# Patient Record
Sex: Female | Born: 1935 | Race: White | Hispanic: No | Marital: Married | Smoking: Never smoker
Health system: Southern US, Community
[De-identification: ages and names within clinical notes are randomized; demographics above are authoritative.]

## PROBLEM LIST (undated history)

## (undated) DIAGNOSIS — Z87440 Personal history of urinary (tract) infections: Secondary | ICD-10-CM

## (undated) DIAGNOSIS — E785 Hyperlipidemia, unspecified: Secondary | ICD-10-CM

## (undated) DIAGNOSIS — C4490 Unspecified malignant neoplasm of skin, unspecified: Secondary | ICD-10-CM

## (undated) DIAGNOSIS — H9190 Unspecified hearing loss, unspecified ear: Secondary | ICD-10-CM

## (undated) DIAGNOSIS — G43909 Migraine, unspecified, not intractable, without status migrainosus: Secondary | ICD-10-CM

## (undated) DIAGNOSIS — G5603 Carpal tunnel syndrome, bilateral upper limbs: Secondary | ICD-10-CM

## (undated) DIAGNOSIS — R011 Cardiac murmur, unspecified: Secondary | ICD-10-CM

## (undated) DIAGNOSIS — M1990 Unspecified osteoarthritis, unspecified site: Secondary | ICD-10-CM

## (undated) DIAGNOSIS — C50919 Malignant neoplasm of unspecified site of unspecified female breast: Secondary | ICD-10-CM

## (undated) HISTORY — PX: ABDOMINAL HYSTERECTOMY: SHX81

## (undated) HISTORY — PX: APPENDECTOMY: SHX54

## (undated) HISTORY — PX: NASAL SINUS SURGERY: SHX719

## (undated) HISTORY — PX: CARPAL TUNNEL RELEASE: SHX101

## (undated) HISTORY — DX: Unspecified osteoarthritis, unspecified site: M19.90

## (undated) HISTORY — DX: Hyperlipidemia, unspecified: E78.5

---

## 2004-06-13 HISTORY — PX: EYE SURGERY: SHX253

## 2005-01-04 ENCOUNTER — Ambulatory Visit: Payer: Self-pay | Admitting: Internal Medicine

## 2005-06-13 HISTORY — PX: OTHER SURGICAL HISTORY: SHX169

## 2005-12-20 ENCOUNTER — Ambulatory Visit: Payer: Self-pay | Admitting: Internal Medicine

## 2006-01-06 ENCOUNTER — Ambulatory Visit: Payer: Self-pay | Admitting: Internal Medicine

## 2007-02-08 ENCOUNTER — Ambulatory Visit: Payer: Self-pay | Admitting: Internal Medicine

## 2008-03-07 ENCOUNTER — Ambulatory Visit: Payer: Self-pay | Admitting: Internal Medicine

## 2009-03-13 HISTORY — PX: BREAST LUMPECTOMY: SHX2

## 2009-03-19 ENCOUNTER — Ambulatory Visit: Payer: Self-pay | Admitting: Internal Medicine

## 2009-03-31 ENCOUNTER — Ambulatory Visit: Payer: Self-pay | Admitting: Internal Medicine

## 2009-04-30 ENCOUNTER — Encounter: Admission: RE | Admit: 2009-04-30 | Discharge: 2009-04-30 | Payer: Self-pay | Admitting: General Surgery

## 2009-05-14 ENCOUNTER — Encounter: Admission: RE | Admit: 2009-05-14 | Discharge: 2009-05-14 | Payer: Self-pay | Admitting: General Surgery

## 2009-05-19 ENCOUNTER — Ambulatory Visit (HOSPITAL_BASED_OUTPATIENT_CLINIC_OR_DEPARTMENT_OTHER): Admission: RE | Admit: 2009-05-19 | Discharge: 2009-05-19 | Payer: Self-pay | Admitting: General Surgery

## 2009-05-19 ENCOUNTER — Encounter: Admission: RE | Admit: 2009-05-19 | Discharge: 2009-05-19 | Payer: Self-pay | Admitting: General Surgery

## 2010-07-09 ENCOUNTER — Encounter
Admission: RE | Admit: 2010-07-09 | Discharge: 2010-07-09 | Payer: Self-pay | Source: Home / Self Care | Attending: Internal Medicine | Admitting: Internal Medicine

## 2010-09-14 LAB — POCT HEMOGLOBIN-HEMACUE: Hemoglobin: 15.4 g/dL — ABNORMAL HIGH (ref 12.0–15.0)

## 2011-06-28 DIAGNOSIS — R5381 Other malaise: Secondary | ICD-10-CM | POA: Diagnosis not present

## 2011-06-28 DIAGNOSIS — E7800 Pure hypercholesterolemia, unspecified: Secondary | ICD-10-CM | POA: Diagnosis not present

## 2011-07-05 ENCOUNTER — Other Ambulatory Visit: Payer: Self-pay | Admitting: Internal Medicine

## 2011-07-05 DIAGNOSIS — N63 Unspecified lump in unspecified breast: Secondary | ICD-10-CM | POA: Diagnosis not present

## 2011-07-05 DIAGNOSIS — E7800 Pure hypercholesterolemia, unspecified: Secondary | ICD-10-CM | POA: Diagnosis not present

## 2011-07-05 DIAGNOSIS — R945 Abnormal results of liver function studies: Secondary | ICD-10-CM | POA: Diagnosis not present

## 2011-07-05 DIAGNOSIS — Z124 Encounter for screening for malignant neoplasm of cervix: Secondary | ICD-10-CM | POA: Diagnosis not present

## 2011-07-05 DIAGNOSIS — N6459 Other signs and symptoms in breast: Secondary | ICD-10-CM

## 2011-07-12 ENCOUNTER — Ambulatory Visit
Admission: RE | Admit: 2011-07-12 | Discharge: 2011-07-12 | Disposition: A | Discharge status: Home / Self Care | Payer: 59 | Source: Ambulatory Visit | Attending: Internal Medicine | Admitting: Internal Medicine

## 2011-07-12 ENCOUNTER — Other Ambulatory Visit: Payer: Self-pay | Admitting: Internal Medicine

## 2011-07-12 ENCOUNTER — Ambulatory Visit
Admission: RE | Admit: 2011-07-12 | Discharge: 2011-07-12 | Disposition: A | Discharge status: Home / Self Care | Payer: Medicare Other | Source: Ambulatory Visit | Attending: Internal Medicine | Admitting: Internal Medicine

## 2011-07-12 ENCOUNTER — Other Ambulatory Visit: Payer: Self-pay | Admitting: Diagnostic Radiology

## 2011-07-12 DIAGNOSIS — R928 Other abnormal and inconclusive findings on diagnostic imaging of breast: Secondary | ICD-10-CM | POA: Diagnosis not present

## 2011-07-12 DIAGNOSIS — N63 Unspecified lump in unspecified breast: Secondary | ICD-10-CM | POA: Diagnosis not present

## 2011-07-12 DIAGNOSIS — N6320 Unspecified lump in the left breast, unspecified quadrant: Secondary | ICD-10-CM

## 2011-07-12 DIAGNOSIS — N6459 Other signs and symptoms in breast: Secondary | ICD-10-CM

## 2011-07-12 DIAGNOSIS — C50919 Malignant neoplasm of unspecified site of unspecified female breast: Secondary | ICD-10-CM | POA: Diagnosis not present

## 2011-07-12 DIAGNOSIS — Z1211 Encounter for screening for malignant neoplasm of colon: Secondary | ICD-10-CM | POA: Diagnosis not present

## 2011-07-13 ENCOUNTER — Other Ambulatory Visit: Payer: Self-pay | Admitting: Internal Medicine

## 2011-07-13 DIAGNOSIS — C50912 Malignant neoplasm of unspecified site of left female breast: Secondary | ICD-10-CM

## 2011-07-14 ENCOUNTER — Encounter (INDEPENDENT_AMBULATORY_CARE_PROVIDER_SITE_OTHER): Payer: Self-pay | Admitting: General Surgery

## 2011-07-19 ENCOUNTER — Ambulatory Visit
Admission: RE | Admit: 2011-07-19 | Discharge: 2011-07-19 | Disposition: A | Discharge status: Home / Self Care | Payer: Medicare Other | Source: Ambulatory Visit | Attending: Internal Medicine | Admitting: Internal Medicine

## 2011-07-19 DIAGNOSIS — C50919 Malignant neoplasm of unspecified site of unspecified female breast: Secondary | ICD-10-CM | POA: Diagnosis not present

## 2011-07-19 DIAGNOSIS — N63 Unspecified lump in unspecified breast: Secondary | ICD-10-CM | POA: Diagnosis not present

## 2011-07-19 DIAGNOSIS — C50912 Malignant neoplasm of unspecified site of left female breast: Secondary | ICD-10-CM

## 2011-07-19 MED ORDER — GADOBENATE DIMEGLUMINE 529 MG/ML IV SOLN
9.0000 mL | Freq: Once | INTRAVENOUS | Status: AC | PRN
  Administered 2011-07-19: 9 mL via INTRAVENOUS

## 2011-07-21 ENCOUNTER — Encounter (INDEPENDENT_AMBULATORY_CARE_PROVIDER_SITE_OTHER): Payer: Self-pay | Admitting: General Surgery

## 2011-07-21 ENCOUNTER — Ambulatory Visit (INDEPENDENT_AMBULATORY_CARE_PROVIDER_SITE_OTHER): Payer: Medicare Other | Admitting: General Surgery

## 2011-07-21 VITALS — BP 116/68 | HR 60 | Temp 97.4°F | Resp 16 | Ht 65.0 in | Wt 102.8 lb

## 2011-07-21 DIAGNOSIS — C50912 Malignant neoplasm of unspecified site of left female breast: Secondary | ICD-10-CM

## 2011-07-21 DIAGNOSIS — C50919 Malignant neoplasm of unspecified site of unspecified female breast: Secondary | ICD-10-CM

## 2011-07-21 NOTE — Progress Notes (Addendum)
Patient ID: Cassidy Martinez, female   DOB: 04-11-1932, 78 y.o.   MRN: [id number]  Chief Complaint  Patient presents with  . New Evaluation    Breast cancer    HPI Cassidy Martinez is a 78 y.o. female.  She is referred by Dr. Lance Coon at the [facility] and is referred for evaluation and management of an invasive cancer of the left breast at the 12:00 position.  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  She felt a lump in her left breast in December. In January she had mammograms, ultrasounds, a biopsy and a MRI. The mammograms and ultrasound showed a 1.9 cm spiculated mass in the left breast at the 12:00 position consistent with the palpable mass. Image guided biopsy showed invasive ductal carcinoma, ER 100% positive, PR-negative, HER-2-negative, Ki-67 13%.  MRI shows a 2.5 cm mass in the left breast at 12:00 position. There were no other abnormalities.  We discussed her case at [facility] [employer] [facility].  Family history is positive for breast cancer in 2 aunts. Her mother had ovarian cancer.  The patient is very healthy has almost no medical problems except for macular degeneration. HPI  Past Medical History  Diagnosis Date  . Hyperlipidemia   . Arthritis   . Cancer     breast    Past Surgical History  Procedure Date  . Nasal sinus surgery   . Carpal tunnel release   . Scalp mass removal 2007  . Abdominal hysterectomy 1983 - approximate  . Breast surgery 03/2009    right - patient unsure of procedure  . Eye surgery 2006    repair of tear in retina    Family History  Problem Relation Age of Onset  . Stroke Mother   . Cancer Mother     ovarian  . Arthritis Father   . Cancer Maternal Aunt     breast  . Cancer Paternal Aunt     breast    Social History History  Substance Use Topics  . Smoking status: Never Smoker   . Smokeless tobacco: Never Used  . Alcohol Use: No    Allergies  Allergen  Reactions  . Sulfa Antibiotics Itching    Starts at feet and rises up the body.    Current Outpatient Prescriptions  Medication Sig Dispense Refill  . Ascorbic Acid (VITAMIN C PO) Take 600 mg by mouth daily.      Marland Kitchen CALCIUM PO Take 1,000 mg by mouth daily.      . Cholecalciferol (VITAMIN D-3 PO) Take by mouth daily.      . NON FORMULARY 4 (four) times daily. Vision Essential        Review of Systems Review of Systems  Constitutional: Negative for fever, chills and unexpected weight change.  HENT: Negative for hearing loss, congestion, sore throat, trouble swallowing and voice change.   Eyes: Negative for visual disturbance.  Respiratory: Negative for cough and wheezing.   Cardiovascular: Negative for chest pain, palpitations and leg swelling.  Gastrointestinal: Negative for nausea, vomiting, abdominal pain, diarrhea, constipation, blood in stool, abdominal distention and anal bleeding.  Genitourinary: Negative for hematuria, vaginal bleeding and difficulty urinating.  Musculoskeletal: Negative for arthralgias.  Skin: Negative for rash and wound.  Neurological: Negative for seizures, syncope and headaches.  Hematological: Negative for adenopathy. Does not bruise/bleed easily.  Psychiatric/Behavioral: Negative for confusion.    Blood pressure 116/68, pulse 60, temperature  97.4 F (36.3 C), temperature source Temporal, resp. rate 16, height 5\' 5"  (1.651 m), weight 102 lb 12.8 oz (46.63 kg).  Physical Exam Physical Exam  Constitutional: She is oriented to person, place, and time. She appears well-developed and well-nourished. No distress.  HENT:  Head: Normocephalic and atraumatic.  Nose: Nose normal.  Mouth/Throat: No oropharyngeal exudate.  Eyes: Conjunctivae and EOM are normal. Pupils are equal, round, and reactive to light. Left eye exhibits no discharge. No scleral icterus.  Neck: Neck supple. No JVD present. No tracheal deviation present. No thyromegaly present.    Cardiovascular: Normal rate, regular rhythm, normal heart sounds and intact distal pulses.   No murmur heard. Pulmonary/Chest: Effort normal and breath sounds normal. No respiratory distress. She has no wheezes. She has no rales. She exhibits no tenderness. Right breast exhibits no inverted nipple, no mass, no nipple discharge, no skin change and no tenderness. Left breast exhibits mass. Left breast exhibits no inverted nipple, no nipple discharge, no skin change and no tenderness. Breasts are symmetrical.         2.5 cm palpable mass left breast, 12:00 position. This actually feels a little bit larger, possibly because of biopsy affect. The breasts are atrophic and not very large.  Abdominal: Soft. Bowel sounds are normal. She exhibits no distension and no mass. There is no tenderness. There is no rebound and no guarding.  Musculoskeletal: She exhibits no edema and no tenderness.  Lymphadenopathy:    She has no cervical adenopathy.  Neurological: She is alert and oriented to person, place, and time. She exhibits normal muscle tone. Coordination normal.  Skin: Skin is warm. No rash noted. She is not diaphoretic. No erythema. No pallor.  Psychiatric: She has a normal mood and affect. Her behavior is normal. Judgment and thought content normal.    Data Reviewed I have reviewed her imaging studies, her pathology report, her pathology slides, and I have discussed her case at conference. I have reviewed our old records from her previous surgery.  Assessment    Invasive ductal carcinoma left breast, 12:00 position, 2.5 cm tumor, clinical stage T2, N0, ER positive, PR-negative, HER-2-negative, Ki-67 13%  Past history right partial mastectomy for atypical ductal hyperplasia  Family history breast cancer in 2 aunts and ovarian cancer in her mother    Plan    After an extensive discussion, she has told me that she would like to conserve her breast, if possible and I have told her that I think  that her tumor is large compared to the volume of her breast, and that I could do a lumpectomy now but that I think that the cosmetic deformity would be very significant. I told her that if she wants to have breast conservation surgery  I would like a medical oncology consultation at this time to consider neoadjuvant therapy such as anti-estrogen pills.  We discussed lumpectomy and sentinel node biopsy as well as mastectomy and SLN biopsy.   She is aware that she will need radiation therapy after lumpectomy.   I have told her that neoadjuvant therapy has a good chance of shrinking the tumor to where the partial mastectomy would be less of a cosmetic defect.  At this time she is having a hard time making a decision and she wantsto go home and think about it and talk to one of her friends that has been treated for breast cancer. She clearly want to pursue standard treatment of some type. She says that she will  call me by Monday with her decision and  to have further discussion.  I have told her that I am in favor of whatever option makes her  most comfortable, but that my bias is to refer to medical oncology for neoadjuvant therapy. I look forward to talking to her in the next 3 or 4 days.  ADDENENDUM: (07/25/2011) Patient called and wants to proceed with left  total mastectomy. She declines referral to plastics at this time. She declined referral to medical oncology at this time.       Angelia Mould. Derrell Lolling, M.D., [facility] General and Minimally invasive Surgery Breast and Colorectal Surgery Office:   [phone] Pager:   [phone]  07/21/2011, 2:24 PM

## 2011-07-21 NOTE — Patient Instructions (Signed)
You have an invasive cancer of the left breast in the upper central part of the breast. This is approximately 1 inch in diameter. We have talked about different options for surgical intervention and the implications and expected outcomes of mastectomy and lumpectomy.  You will also need lymph node biopsies.  . We have also talked about neoadjuvant(preop)  therapy to shrink the tumor and allow a lumpectomy with less of a cosmetic defect.  You are going to call me back within the next 4 days to let me know what your decisions are

## 2011-07-25 ENCOUNTER — Other Ambulatory Visit (INDEPENDENT_AMBULATORY_CARE_PROVIDER_SITE_OTHER): Payer: Self-pay | Admitting: General Surgery

## 2011-07-25 ENCOUNTER — Telehealth (INDEPENDENT_AMBULATORY_CARE_PROVIDER_SITE_OTHER): Payer: Self-pay

## 2011-07-25 DIAGNOSIS — C50912 Malignant neoplasm of unspecified site of left female breast: Secondary | ICD-10-CM

## 2011-07-25 NOTE — Telephone Encounter (Signed)
Pt called stating she and her family have discussed her surgery and pt wants to proceed with total mastectomy. Pt advised I will have Dr Derrell Lolling do orders and send scheduling form to surgery schedulers.

## 2011-07-26 ENCOUNTER — Encounter (INDEPENDENT_AMBULATORY_CARE_PROVIDER_SITE_OTHER): Payer: Self-pay | Admitting: General Surgery

## 2011-08-01 ENCOUNTER — Encounter (HOSPITAL_COMMUNITY): Payer: Self-pay | Admitting: Pharmacy Technician

## 2011-08-04 ENCOUNTER — Encounter (HOSPITAL_COMMUNITY)
Admission: RE | Admit: 2011-08-04 | Discharge: 2011-08-04 | Disposition: A | Discharge status: Home / Self Care | Payer: Medicare Other | Source: Ambulatory Visit | Attending: General Surgery | Admitting: General Surgery

## 2011-08-04 ENCOUNTER — Encounter (HOSPITAL_COMMUNITY): Payer: Self-pay

## 2011-08-04 DIAGNOSIS — Z01812 Encounter for preprocedural laboratory examination: Secondary | ICD-10-CM | POA: Diagnosis not present

## 2011-08-04 DIAGNOSIS — E785 Hyperlipidemia, unspecified: Secondary | ICD-10-CM | POA: Diagnosis not present

## 2011-08-04 DIAGNOSIS — C50919 Malignant neoplasm of unspecified site of unspecified female breast: Secondary | ICD-10-CM | POA: Diagnosis not present

## 2011-08-04 DIAGNOSIS — Z01818 Encounter for other preprocedural examination: Secondary | ICD-10-CM | POA: Diagnosis not present

## 2011-08-04 DIAGNOSIS — Z01811 Encounter for preprocedural respiratory examination: Secondary | ICD-10-CM | POA: Diagnosis not present

## 2011-08-04 HISTORY — DX: Personal history of urinary (tract) infections: Z87.440

## 2011-08-04 HISTORY — DX: Cardiac murmur, unspecified: R01.1

## 2011-08-04 LAB — COMPREHENSIVE METABOLIC PANEL
Alkaline Phosphatase: 76 U/L (ref 39–117)
BUN: 22 mg/dL (ref 6–23)
CO2: 30 mEq/L (ref 19–32)
GFR calc Af Amer: 50 mL/min — ABNORMAL LOW (ref >90)
GFR calc non Af Amer: 43 mL/min — ABNORMAL LOW (ref >90)
Glucose, Bld: 82 mg/dL (ref 70–99)
Potassium: 3.9 mEq/L (ref 3.5–5.1)
Total Bilirubin: 0.6 mg/dL (ref 0.3–1.2)
Total Protein: 7.2 g/dL (ref 6.0–8.3)

## 2011-08-04 LAB — CANCER ANTIGEN 27.29: CA 27.29: 26 U/mL (ref 0–39)

## 2011-08-04 LAB — CBC
HCT: 43.9 % (ref 36.0–46.0)
Hemoglobin: 14.4 g/dL (ref 12.0–15.0)
MCHC: 32.8 g/dL (ref 30.0–36.0)
RBC: 4.88 MIL/uL (ref 3.87–5.11)

## 2011-08-04 LAB — URINALYSIS, ROUTINE W REFLEX MICROSCOPIC
Ketones, ur: NEGATIVE mg/dL
Leukocytes, UA: NEGATIVE
Nitrite: NEGATIVE
Protein, ur: NEGATIVE mg/dL
Urobilinogen, UA: 0.2 mg/dL (ref 0.0–1.0)

## 2011-08-04 LAB — DIFFERENTIAL
Basophils Absolute: 0.1 10*3/uL (ref 0.0–0.1)
Basophils Relative: 1 % (ref 0–1)
Eosinophils Absolute: 0.3 10*3/uL (ref 0.0–0.7)
Lymphs Abs: 2.3 10*3/uL (ref 0.7–4.0)
Neutrophils Relative %: 55 % (ref 43–77)

## 2011-08-04 LAB — SURGICAL PCR SCREEN: MRSA, PCR: NEGATIVE

## 2011-08-04 NOTE — Pre-Procedure Instructions (Signed)
Cassidy Martinez  08/04/2011   Your procedure is scheduled on:  Friday, Mar 1  Report to [facility] at *0930** AM.  Call this number if you have problems the morning of surgery: [phone]   Remember:   Do not eat food:After Midnight.  May have clear liquids: up to 4 Hours before arrival.  Clear liquids include soda, tea, black coffee, apple or grape juice, broth.  Take these medicines the morning of surgery with A SIP OF WATER: **none*   Do not wear jewelry, make-up or nail polish.  Do not wear lotions, powders, or perfumes. You may wear deodorant.  Do not shave 48 hours prior to surgery.  Do not bring valuables to the hospital.  Contacts, dentures or bridgework may not be worn into surgery.  Leave suitcase in the car. After surgery it may be brought to your room.  For patients admitted to the hospital, checkout time is 11:00 AM the day of discharge.   Patients discharged the day of surgery will not be allowed to drive home.  Name and phone number of your driver: *spouse**  Special Instructions: CHG Shower Use Special Wash: 1/2 bottle night before surgery and 1/2 bottle morning of surgery.   Please read over the following fact sheets that you were given: Pain Booklet, Coughing and Deep Breathing, MRSA Information and Surgical Site Infection Prevention

## 2011-08-11 MED ORDER — CHLORHEXIDINE GLUCONATE 4 % EX LIQD
1.0000 "application " | Freq: Once | CUTANEOUS | Status: DC
  Filled 2011-08-11: qty 15

## 2011-08-11 MED ORDER — CEFAZOLIN SODIUM-DEXTROSE 2-3 GM-% IV SOLR
2.0000 g | INTRAVENOUS | Status: DC
  Filled 2011-08-11: qty 50

## 2011-08-11 MED ORDER — HEPARIN SODIUM (PORCINE) 5000 UNIT/ML IJ SOLN
5000.0000 [IU] | Freq: Once | INTRAMUSCULAR | Status: AC
  Administered 2011-08-12: 5000 [IU] via SUBCUTANEOUS
  Filled 2011-08-11: qty 1

## 2011-08-11 NOTE — H&P (Signed)
Cassidy Martinez    MRN: [id number]   Description: 78 year old female  Provider: Ernestene Mention, MD  Department: [facility]        Diagnoses     Cancer of left breast   - Primary    174.9     Vitals       BP Pulse Temp(Src) Resp Ht Wt    116/68  60  97.4 F (36.3 C) (Temporal)  16  5\' 5"  (1.651 m)  102 lb 12.8 oz (46.63 kg)     BMI -17.11 kg/m2               History and Physical    Ernestene Mention, MD   Patient ID: Cassidy Martinez, female   DOB: Jan 01, 1932, 78 y.o.   MRN: [id number]    Chief Complaint   Patient presents with   .  New Evaluation       Breast cancer        HPI Cassidy Martinez is a 78 y.o. female.  She is referred by Dr. Lance Coon at the [facility] and is referred for evaluation and management of an invasive cancer of the left breast at the 12:00 position.  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  She felt a lump in her left breast in December. In January she had mammograms, ultrasounds, a biopsy and a MRI. The mammograms and ultrasound showed a 1.9 cm spiculated mass in the left breast at the 12:00 position consistent with the palpable mass. Image guided biopsy showed invasive ductal carcinoma, ER 100% positive, PR-negative, HER-2-negative, Ki-67 13%.  MRI shows a 2.5 cm mass in the left breast at 12:00 position. There were no other abnormalities.  We discussed her case at [facility].  Family history is positive for breast cancer in 2 aunts. Her mother had ovarian cancer.  The patient is very healthy has almost no medical problems except for macular degeneration.      Past Medical History   Diagnosis  Date   .  Hyperlipidemia     .  Arthritis     .  Cancer         breast       Past Surgical History   Procedure  Date   .  Nasal sinus surgery     .  Carpal tunnel release     .  Scalp mass removal  2007   .  Abdominal hysterectomy  1983 - approximate     .  Breast surgery  03/2009       right - patient unsure of procedure   .  Eye surgery  2006       repair of tear in retina       Family History   Problem  Relation  Age of Onset   .  Stroke  Mother     .  Cancer  Mother         ovarian   .  Arthritis  Father     .  Cancer  Maternal Aunt         breast   .  Cancer  Paternal Aunt         breast      Social History History   Substance Use Topics   .  Smoking status:  Never Smoker    .  Smokeless tobacco:  Never  Used   .  Alcohol Use:  No       Allergies   Allergen  Reactions   .  Sulfa Antibiotics  Itching       Starts at feet and rises up the body.       Current Outpatient Prescriptions   Medication  Sig  Dispense  Refill   .  Ascorbic Acid (VITAMIN C PO)  Take 600 mg by mouth daily.         Marland Kitchen  CALCIUM PO  Take 1,000 mg by mouth daily.         .  Cholecalciferol (VITAMIN D-3 PO)  Take by mouth daily.         .  NON FORMULARY  4 (four) times daily. Vision Essential            Review of Systems   Constitutional: Negative for fever, chills and unexpected weight change.  HENT: Negative for hearing loss, congestion, sore throat, trouble swallowing and voice change.   Eyes: Negative for visual disturbance.  Respiratory: Negative for cough and wheezing.   Cardiovascular: Negative for chest pain, palpitations and leg swelling.  Gastrointestinal: Negative for nausea, vomiting, abdominal pain, diarrhea, constipation, blood in stool, abdominal distention and anal bleeding.  Genitourinary: Negative for hematuria, vaginal bleeding and difficulty urinating.  Musculoskeletal: Negative for arthralgias.  Skin: Negative for rash and wound.  Neurological: Negative for seizures, syncope and headaches.  Hematological: Negative for adenopathy. Does not bruise/bleed easily.  Psychiatric/Behavioral: Negative for confusion.    Blood pressure 116/68, pulse 60, temperature 97.4 F (36.3 C), temperature source Temporal, resp. rate 16,  height 5\' 5"  (1.651 m), weight 102 lb 12.8 oz (46.63 kg).   Physical Exam   Constitutional: She is oriented to person, place, and time. She appears well-developed and well-nourished. No distress.  HENT:   Head: Normocephalic and atraumatic.   Nose: Nose normal.   Mouth/Throat: No oropharyngeal exudate.  Eyes: Conjunctivae and EOM are normal. Pupils are equal, round, and reactive to light. Left eye exhibits no discharge. No scleral icterus.  Neck: Neck supple. No JVD present. No tracheal deviation present. No thyromegaly present.  Cardiovascular: Normal rate, regular rhythm, normal heart sounds and intact distal pulses.    No murmur heard. Pulmonary/Chest: Effort normal and breath sounds normal. No respiratory distress. She has no wheezes. She has no rales. She exhibits no tenderness. Right breast exhibits no inverted nipple, no mass, no nipple discharge, no skin change and no tenderness. Left breast exhibits mass. Left breast exhibits no inverted nipple, no nipple discharge, no skin change and no tenderness. Breasts are symmetrical.         2.5 cm palpable mass left breast, 12:00 position. This actually feels a little bit larger, possibly because of biopsy affect. The breasts are atrophic and not very large.  Abdominal: Soft. Bowel sounds are normal. She exhibits no distension and no mass. There is no tenderness. There is no rebound and no guarding.  Musculoskeletal: She exhibits no edema and no tenderness.  Lymphadenopathy:    She has no cervical adenopathy.  Neurological: She is alert and oriented to person, place, and time. She exhibits normal muscle tone. Coordination normal.  Skin: Skin is warm. No rash noted. She is not diaphoretic. No erythema. No pallor.  Psychiatric: She has a normal mood and affect. Her behavior is normal. Judgment and thought content normal.    Data Reviewed I have reviewed her imaging studies, her pathology report, her pathology slides,  and I have discussed  her case at conference. I have reviewed our old records from her previous surgery.   Assessment Invasive ductal carcinoma left breast, 12:00 position, 2.5 cm tumor, clinical stage T2, N0, ER positive, PR-negative, HER-2-negative, Ki-67 13%  Past history right partial mastectomy for atypical ductal hyperplasia  Family history breast cancer in 2 aunts and ovarian cancer in her mother   Plan After an extensive discussion, she has told me that she would like to conserve her breast, if possible and I have told her that I think that her tumor is large compared to the volume of her breast, and that I could do a lumpectomy now but that I think that the cosmetic deformity would be very significant. I told her that if she wants to have breast conservation surgery  I would like a medical oncology consultation at this time to consider neoadjuvant therapy such as anti-estrogen pills.  We discussed lumpectomy and sentinel node biopsy as well as mastectomy and SLN biopsy.   She is aware that she will need radiation therapy after lumpectomy.   I have told her that neoadjuvant therapy has a good chance of shrinking the tumor to where the partial mastectomy would be less of a cosmetic defect.  At this time she is having a hard time making a decision and she wantsto go home and think about it and talk to one of her friends that has been treated for breast cancer. She clearly want to pursue standard treatment of some type. She says that she will call me by Monday with her decision and  to have further discussion.  I have told her that I am in favor of whatever option makes her  most comfortable, but that my bias is to refer to medical oncology for neoadjuvant therapy. I look forward to talking to her in the next 3 or 4 days.  ADDENENDUM: (07/25/2011) Patient called and wants to proceed with left  total mastectomy. She declines referral to plastics at this time. She declined referral to medical oncology at this  time.       Angelia Mould. Derrell Lolling, M.D., [facility] General and Minimally invasive Surgery Breast and Colorectal Surgery Office:   [phone] Pager:   [phone]

## 2011-08-11 NOTE — Progress Notes (Signed)
Notified Pt. Of time change,stated for her to be at short stay at 0530.

## 2011-08-12 ENCOUNTER — Encounter (HOSPITAL_COMMUNITY): Payer: Self-pay | Admitting: Registered Nurse

## 2011-08-12 ENCOUNTER — Encounter (HOSPITAL_COMMUNITY): Payer: Self-pay | Admitting: General Practice

## 2011-08-12 ENCOUNTER — Encounter (HOSPITAL_COMMUNITY): Payer: Self-pay | Admitting: *Deleted

## 2011-08-12 ENCOUNTER — Other Ambulatory Visit (HOSPITAL_COMMUNITY): Payer: Medicare Other

## 2011-08-12 ENCOUNTER — Ambulatory Visit (HOSPITAL_COMMUNITY): Payer: Medicare Other | Admitting: Registered Nurse

## 2011-08-12 ENCOUNTER — Ambulatory Visit (HOSPITAL_COMMUNITY)
Admission: RE | Admit: 2011-08-12 | Discharge: 2011-08-12 | Disposition: A | Discharge status: Home / Self Care | Payer: Medicare Other | Source: Ambulatory Visit | Attending: General Surgery | Admitting: General Surgery

## 2011-08-12 ENCOUNTER — Encounter (HOSPITAL_COMMUNITY)
Admission: RE | Disposition: A | Discharge status: Home / Self Care | Payer: Self-pay | Source: Ambulatory Visit | Attending: General Surgery

## 2011-08-12 ENCOUNTER — Ambulatory Visit (HOSPITAL_COMMUNITY)
Admission: RE | Admit: 2011-08-12 | Discharge: 2011-08-14 | Disposition: A | Discharge status: Home / Self Care | Payer: Medicare Other | Source: Ambulatory Visit | Attending: General Surgery | Admitting: General Surgery

## 2011-08-12 DIAGNOSIS — D0590 Unspecified type of carcinoma in situ of unspecified breast: Secondary | ICD-10-CM | POA: Diagnosis not present

## 2011-08-12 DIAGNOSIS — E785 Hyperlipidemia, unspecified: Secondary | ICD-10-CM | POA: Diagnosis not present

## 2011-08-12 DIAGNOSIS — C50912 Malignant neoplasm of unspecified site of left female breast: Secondary | ICD-10-CM

## 2011-08-12 DIAGNOSIS — C50919 Malignant neoplasm of unspecified site of unspecified female breast: Secondary | ICD-10-CM | POA: Diagnosis not present

## 2011-08-12 DIAGNOSIS — Z01812 Encounter for preprocedural laboratory examination: Secondary | ICD-10-CM | POA: Diagnosis not present

## 2011-08-12 DIAGNOSIS — Z01818 Encounter for other preprocedural examination: Secondary | ICD-10-CM | POA: Insufficient documentation

## 2011-08-12 HISTORY — PX: MASTECTOMY W/ SENTINEL NODE BIOPSY: SHX2001

## 2011-08-12 HISTORY — DX: Carpal tunnel syndrome, bilateral upper limbs: G56.03

## 2011-08-12 HISTORY — PX: MASTECTOMY COMPLETE / SIMPLE W/ SENTINEL NODE BIOPSY: SUR846

## 2011-08-12 HISTORY — DX: Migraine, unspecified, not intractable, without status migrainosus: G43.909

## 2011-08-12 HISTORY — DX: Malignant neoplasm of unspecified site of unspecified female breast: C50.919

## 2011-08-12 SURGERY — MASTECTOMY WITH SENTINEL LYMPH NODE BIOPSY
Anesthesia: General | Site: Breast | Laterality: Left | Wound class: Clean

## 2011-08-12 MED ORDER — CALCIUM CARBONATE 1250 (500 CA) MG PO TABS
1000.0000 mg | ORAL_TABLET | Freq: Every day | ORAL | Status: DC
  Administered 2011-08-13: 1000 mg via ORAL
  Filled 2011-08-12 (×4): qty 2

## 2011-08-12 MED ORDER — ONDANSETRON HCL 4 MG PO TABS: 4.0000 mg | ORAL_TABLET | Freq: Four times a day (QID) | ORAL | Status: DC | PRN

## 2011-08-12 MED ORDER — LIDOCAINE HCL (CARDIAC) 10 MG/ML IV SOLN
INTRAVENOUS | Status: DC | PRN
  Administered 2011-08-12: 20 mg via INTRAVENOUS

## 2011-08-12 MED ORDER — CALCIUM 500 MG PO TABS: 1000.0000 mg | ORAL_TABLET | Freq: Every morning | ORAL | Status: DC

## 2011-08-12 MED ORDER — CHOLECALCIFEROL 10 MCG (400 UNIT) PO TABS
400.0000 [IU] | ORAL_TABLET | Freq: Every day | ORAL | Status: DC
  Administered 2011-08-13: 400 [IU] via ORAL
  Filled 2011-08-12 (×4): qty 1

## 2011-08-12 MED ORDER — 0.9 % SODIUM CHLORIDE (POUR BTL) OPTIME
TOPICAL | Status: DC | PRN
  Administered 2011-08-12: 1000 mL

## 2011-08-12 MED ORDER — LACTATED RINGERS IV SOLN: INTRAVENOUS | Status: DC

## 2011-08-12 MED ORDER — POLYVINYL ALCOHOL 1.4 % OP SOLN
1.0000 [drp] | Freq: Two times a day (BID) | OPHTHALMIC | Status: DC
  Administered 2011-08-13 (×2): 1 [drp] via OPHTHALMIC
  Filled 2011-08-12: qty 15

## 2011-08-12 MED ORDER — METHYLENE BLUE 1 % INJ SOLN
INTRAMUSCULAR | Status: DC | PRN
  Administered 2011-08-12: 2 mg via INTRAVENOUS

## 2011-08-12 MED ORDER — CEFAZOLIN SODIUM 1-5 GM-% IV SOLN
INTRAVENOUS | Status: AC
Start: 2011-08-12 — End: 2011-08-12
  Filled 2011-08-12: qty 50

## 2011-08-12 MED ORDER — HYDROMORPHONE HCL PF 1 MG/ML IJ SOLN
INTRAMUSCULAR | Status: AC
  Filled 2011-08-12: qty 1

## 2011-08-12 MED ORDER — LACTATED RINGERS IV SOLN
INTRAVENOUS | Status: DC | PRN
  Administered 2011-08-12 (×2): via INTRAVENOUS

## 2011-08-12 MED ORDER — MIDAZOLAM HCL 5 MG/5ML IJ SOLN
INTRAMUSCULAR | Status: DC | PRN
  Administered 2011-08-12: 1 mg via INTRAVENOUS

## 2011-08-12 MED ORDER — CARBOXYMETHYLCELLULOSE SODIUM 1 % OP SOLN: 1.0000 [drp] | Freq: Two times a day (BID) | OPHTHALMIC | Status: DC

## 2011-08-12 MED ORDER — SODIUM CHLORIDE 0.9 % IJ SOLN
INTRAMUSCULAR | Status: DC | PRN
  Administered 2011-08-12: 08:00:00

## 2011-08-12 MED ORDER — PROPOFOL 10 MG/ML IV BOLUS
INTRAVENOUS | Status: DC | PRN
  Administered 2011-08-12: 20 mg via INTRAVENOUS

## 2011-08-12 MED ORDER — HYDROMORPHONE HCL PF 1 MG/ML IJ SOLN
0.2500 mg | INTRAMUSCULAR | Status: DC | PRN
  Administered 2011-08-12 (×2): 0.25 mg via INTRAVENOUS
  Administered 2011-08-12 (×2): 0.5 mg via INTRAVENOUS

## 2011-08-12 MED ORDER — GLYCOPYRROLATE 0.2 MG/ML IJ SOLN
INTRAMUSCULAR | Status: DC | PRN
  Administered 2011-08-12: .2 mg via INTRAVENOUS

## 2011-08-12 MED ORDER — CEFAZOLIN SODIUM 1-5 GM-% IV SOLN
INTRAVENOUS | Status: DC | PRN
  Administered 2011-08-12: 1 g via INTRAVENOUS

## 2011-08-12 MED ORDER — ONDANSETRON HCL 4 MG/2ML IJ SOLN
INTRAMUSCULAR | Status: DC | PRN
  Administered 2011-08-12: 4 mg via INTRAVENOUS

## 2011-08-12 MED ORDER — HYDROCODONE-ACETAMINOPHEN 5-325 MG PO TABS
1.0000 | ORAL_TABLET | ORAL | Status: DC | PRN
  Administered 2011-08-12 – 2011-08-14 (×5): 1 via ORAL
  Filled 2011-08-12 (×6): qty 1

## 2011-08-12 MED ORDER — VITAMIN D-3 125 MCG (5000 UT) PO TABS
ORAL_TABLET | Freq: Every morning | ORAL | Status: DC
Start: 2011-08-12 — End: 2011-08-12

## 2011-08-12 MED ORDER — ONDANSETRON HCL 4 MG/2ML IJ SOLN
INTRAMUSCULAR | Status: AC
  Filled 2011-08-12: qty 2

## 2011-08-12 MED ORDER — HEPARIN SODIUM (PORCINE) 5000 UNIT/ML IJ SOLN
5000.0000 [IU] | Freq: Three times a day (TID) | INTRAMUSCULAR | Status: DC
  Administered 2011-08-13 – 2011-08-14 (×4): 5000 [IU] via SUBCUTANEOUS
  Filled 2011-08-12 (×7): qty 1

## 2011-08-12 MED ORDER — POTASSIUM CHLORIDE IN NACL 20-0.9 MEQ/L-% IV SOLN
INTRAVENOUS | Status: DC
  Administered 2011-08-12 – 2011-08-14 (×4): via INTRAVENOUS
  Filled 2011-08-12 (×5): qty 1000

## 2011-08-12 MED ORDER — FENTANYL CITRATE 0.05 MG/ML IJ SOLN
INTRAMUSCULAR | Status: DC | PRN
  Administered 2011-08-12: 100 ug via INTRAVENOUS
  Administered 2011-08-12: 25 ug via INTRAVENOUS
  Administered 2011-08-12: 50 ug via INTRAVENOUS
  Administered 2011-08-12: 25 ug via INTRAVENOUS
  Administered 2011-08-12: 50 ug via INTRAVENOUS

## 2011-08-12 MED ORDER — TECHNETIUM TC 99M SULFUR COLLOID FILTERED
1.0000 | Freq: Once | INTRAVENOUS | Status: AC | PRN
  Administered 2011-08-12: 1 via INTRADERMAL

## 2011-08-12 MED ORDER — MORPHINE SULFATE 2 MG/ML IJ SOLN: 1.0000 mg | INTRAMUSCULAR | Status: DC | PRN

## 2011-08-12 MED ORDER — VITAMIN C 500 MG PO TABS
500.0000 mg | ORAL_TABLET | Freq: Every day | ORAL | Status: DC
  Administered 2011-08-13: 500 mg via ORAL
  Filled 2011-08-12 (×3): qty 1

## 2011-08-12 MED ORDER — ONDANSETRON HCL 4 MG/2ML IJ SOLN
4.0000 mg | Freq: Four times a day (QID) | INTRAMUSCULAR | Status: DC | PRN
  Administered 2011-08-12: 4 mg via INTRAVENOUS

## 2011-08-12 MED ORDER — ACETAMINOPHEN 10 MG/ML IV SOLN
INTRAVENOUS | Status: DC | PRN
  Administered 2011-08-12: 675 mg via INTRAVENOUS

## 2011-08-12 MED ORDER — HYDROCODONE-ACETAMINOPHEN 5-325 MG PO TABS: 1.0000 | ORAL_TABLET | ORAL | Status: AC | PRN

## 2011-08-12 MED ORDER — ACETAMINOPHEN 10 MG/ML IV SOLN
INTRAVENOUS | Status: AC
  Filled 2011-08-12: qty 100

## 2011-08-12 MED ORDER — EPHEDRINE SULFATE 50 MG/ML IJ SOLN
INTRAMUSCULAR | Status: DC | PRN
  Administered 2011-08-12: 5 mg via INTRAVENOUS

## 2011-08-12 SURGICAL SUPPLY — 52 items
APPLIER CLIP 9.375 MED OPEN (MISCELLANEOUS) ×2
BENZOIN TINCTURE PRP APPL 2/3 (GAUZE/BANDAGES/DRESSINGS) ×2 IMPLANT
BINDER BREAST LRG (GAUZE/BANDAGES/DRESSINGS) ×2 IMPLANT
BINDER BREAST XLRG (GAUZE/BANDAGES/DRESSINGS) IMPLANT
CANISTER SUCTION 2500CC (MISCELLANEOUS) ×2 IMPLANT
CHLORAPREP W/TINT 26ML (MISCELLANEOUS) ×2 IMPLANT
CLIP APPLIE 9.375 MED OPEN (MISCELLANEOUS) ×1 IMPLANT
CLOTH BEACON ORANGE TIMEOUT ST (SAFETY) ×2 IMPLANT
CONT SPEC 4OZ CLIKSEAL STRL BL (MISCELLANEOUS) ×4 IMPLANT
COVER PROBE W GEL 5X96 (DRAPES) ×2 IMPLANT
COVER SURGICAL LIGHT HANDLE (MISCELLANEOUS) ×2 IMPLANT
DERMABOND ADVANCED (GAUZE/BANDAGES/DRESSINGS)
DERMABOND ADVANCED .7 DNX12 (GAUZE/BANDAGES/DRESSINGS) IMPLANT
DRAIN CHANNEL 19F RND (DRAIN) ×4 IMPLANT
DRAPE LAPAROSCOPIC ABDOMINAL (DRAPES) ×2 IMPLANT
DRSG ADAPTIC 3X8 NADH LF (GAUZE/BANDAGES/DRESSINGS) ×4 IMPLANT
DRSG PAD ABDOMINAL 8X10 ST (GAUZE/BANDAGES/DRESSINGS) ×4 IMPLANT
ELECT BLADE 4.0 EZ CLEAN MEGAD (MISCELLANEOUS) ×2
ELECT CAUTERY BLADE 6.4 (BLADE) ×2 IMPLANT
ELECT REM PT RETURN 9FT ADLT (ELECTROSURGICAL) ×2
ELECTRODE BLDE 4.0 EZ CLN MEGD (MISCELLANEOUS) ×1 IMPLANT
ELECTRODE REM PT RTRN 9FT ADLT (ELECTROSURGICAL) ×1 IMPLANT
EVACUATOR SILICONE 100CC (DRAIN) ×4 IMPLANT
GAUZE SPONGE 4X4 12PLY STRL LF (GAUZE/BANDAGES/DRESSINGS) ×2 IMPLANT
GLOVE BIOGEL PI IND STRL 7.0 (GLOVE) ×2 IMPLANT
GLOVE BIOGEL PI INDICATOR 7.0 (GLOVE) ×2
GLOVE EUDERMIC 7 POWDERFREE (GLOVE) ×2 IMPLANT
GLOVE SURG SIGNA 7.5 PF LTX (GLOVE) ×2 IMPLANT
GLOVE SURG SS PI 6.5 STRL IVOR (GLOVE) ×2 IMPLANT
GOWN PREVENTION PLUS XLARGE (GOWN DISPOSABLE) ×4 IMPLANT
GOWN STRL NON-REIN LRG LVL3 (GOWN DISPOSABLE) ×2 IMPLANT
KIT BASIN OR (CUSTOM PROCEDURE TRAY) ×2 IMPLANT
KIT ROOM TURNOVER OR (KITS) ×2 IMPLANT
NEEDLE 18GX1X1/2 (RX/OR ONLY) (NEEDLE) ×2 IMPLANT
NEEDLE HYPO 25GX1X1/2 BEV (NEEDLE) ×4 IMPLANT
NS IRRIG 1000ML POUR BTL (IV SOLUTION) ×2 IMPLANT
PACK GENERAL/GYN (CUSTOM PROCEDURE TRAY) ×2 IMPLANT
PAD ARMBOARD 7.5X6 YLW CONV (MISCELLANEOUS) ×4 IMPLANT
PEN SKIN MARKING BROAD (MISCELLANEOUS) ×2 IMPLANT
SPECIMEN JAR LARGE (MISCELLANEOUS) ×2 IMPLANT
STAPLER VISISTAT 35W (STAPLE) ×2 IMPLANT
STRIP CLOSURE SKIN 1/2X4 (GAUZE/BANDAGES/DRESSINGS) ×2 IMPLANT
SUT ETHILON 3 0 FSL (SUTURE) ×4 IMPLANT
SUT MNCRL AB 4-0 PS2 18 (SUTURE) ×2 IMPLANT
SUT SILK 2 0 FS (SUTURE) ×2 IMPLANT
SUT VIC AB 3-0 SH 18 (SUTURE) ×6 IMPLANT
SYR CONTROL 10ML LL (SYRINGE) ×4 IMPLANT
TAPE CLOTH SURG 6X10 WHT LF (GAUZE/BANDAGES/DRESSINGS) ×2 IMPLANT
TOWEL OR 17X24 6PK STRL BLUE (TOWEL DISPOSABLE) ×2 IMPLANT
TOWEL OR 17X26 10 PK STRL BLUE (TOWEL DISPOSABLE) ×2 IMPLANT
WATER STERILE IRR 1000ML POUR (IV SOLUTION) IMPLANT
YANKAUER SUCT BULB TIP NO VENT (SUCTIONS) ×2 IMPLANT

## 2011-08-12 NOTE — Anesthesia Preprocedure Evaluation (Signed)
Anesthesia Evaluation  Patient identified by MRN, date of birth, ID band Patient awake    Reviewed: Allergy & Precautions, H&P , NPO status , Patient's Chart, lab work & pertinent test results  History of Anesthesia Complications (+) PONV  Airway Mallampati: I TM Distance: >3 FB Neck ROM: Full    Dental No notable dental hx. (+) Teeth Intact and Dental Advisory Given   Pulmonary neg pulmonary ROS,  clear to auscultation  Pulmonary exam normal       Cardiovascular neg cardio ROS + Valvular Problems/Murmurs (possible heart murmur in past, never symptomatic ) Regular Normal    Neuro/Psych Negative Neurological ROS     GI/Hepatic negative GI ROS, Neg liver ROS,   Endo/Other  Negative Endocrine ROS  Renal/GU negative Renal ROS     Musculoskeletal negative musculoskeletal ROS (+)   Abdominal   Peds  Hematology negative hematology ROS (+)   Anesthesia Other Findings   Reproductive/Obstetrics                           Anesthesia Physical Anesthesia Plan  ASA: II  Anesthesia Plan: General   Post-op Pain Management:    Induction: Intravenous  Airway Management Planned: LMA  Additional Equipment:   Intra-op Plan:   Post-operative Plan:   Informed Consent: I have reviewed the patients History and Physical, chart, labs and discussed the procedure including the risks, benefits and alternatives for the proposed anesthesia with the patient or authorized representative who has indicated his/her understanding and acceptance.   Dental advisory given  Plan Discussed with: CRNA and Surgeon  Anesthesia Plan Comments: (Plan routine monitors, GA)        Anesthesia Quick Evaluation

## 2011-08-12 NOTE — Interval H&P Note (Signed)
History and Physical Interval Note:  08/12/2011 7:16 AM  Cassidy Martinez  has presented today for surgery, with the diagnosis of left breast cancer  The goals of treatment and the various methods of treatment have been discussed with the patient and family. After consideration of risks, benefits and other options for treatment, the patient has consented to  Procedure(s) (LRB): LEFT MASTECTOMY WITH SENTINEL LYMPH NODE BIOPSY (Left) as a surgical intervention .  The patients' history has been reviewed, patient examined, no change in status, stable for surgery.  I have reviewed the patients' chart and labs.  Questions were answered to the patient's satisfaction.     Ernestene Mention

## 2011-08-12 NOTE — Anesthesia Postprocedure Evaluation (Signed)
  Anesthesia Post-op Note  Patient: Cassidy Martinez  Procedure(s) Performed: Procedure(s) (LRB): MASTECTOMY WITH SENTINEL LYMPH NODE BIOPSY (Left)  Patient Location: PACU  Anesthesia Type: General  Level of Consciousness: sedated  Airway and Oxygen Therapy: Patient Spontanous Breathing and Patient connected to nasal cannula oxygen  Post-op Pain: mild  Post-op Assessment: Post-op Vital signs reviewed, Patient's Cardiovascular Status Stable, Respiratory Function Stable, Patent Airway, No signs of Nausea or vomiting and Pain level controlled  Post-op Vital Signs: Reviewed and stable  Complications: No apparent anesthesia complications

## 2011-08-12 NOTE — Transfer of Care (Signed)
Immediate Anesthesia Transfer of Care Note  Patient: Cassidy Martinez  Procedure(s) Performed: Procedure(s) (LRB): MASTECTOMY WITH SENTINEL LYMPH NODE BIOPSY (Left)  Patient Location: PACU  Anesthesia Type: General  Level of Consciousness: awake, alert , sedated, patient cooperative and responds to stimulation  Airway & Oxygen Therapy: Patient Spontanous Breathing and Patient connected to face mask oxygen  Post-op Assessment: Report given to PACU RN and Post -op Vital signs reviewed and stable  Post vital signs: stable  Complications: No apparent anesthesia complications

## 2011-08-12 NOTE — Plan of Care (Deleted)
Problem: Diagnosis - Type of Surgery Goal: General Surgical Patient Education (See Patient Education module for education specifics) Left mastectomy with sentinel node biopsy

## 2011-08-12 NOTE — Op Note (Signed)
Patient Name:           Cassidy Martinez   Date of Surgery:        08/12/2011  Pre op Diagnosis:      Left breast, 12:00 position, clinical stage T2, N0, ER-positive, HER-2-negative, Ki 67 13%  Post op Diagnosis:    same  Procedure:                 Inject blue dye left breast, left total mastectomy, left axillary sentinel lymph node biopsy.  Surgeon:                     Angelia Mould. Derrell Lolling, M.D., FACS  Assistant:                      Loretha Brasil, M.D., FACS  Operative Indications:   Cassidy Martinez is a 78 y.o. female. She is referred by the [facility] and is referred for evaluation and management of an invasive cancer of the left breast at the 12:00 position.  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  She felt a lump in her left breast in December. In January she had mammograms, ultrasounds, a biopsy and a MRI. The mammograms and ultrasound showed a 1.9 cm spiculated mass in the left breast at the 12:00 position consistent with the palpable mass. Image guided biopsy showed invasive ductal carcinoma, ER 100% positive, PR-negative, HER-2-negative, Ki-67 13%. MRI shows a 2.5 cm mass in the left breast at 12:00 position. There were no other abnormalities.I can palpate a 2.5 cm mass in the 12:00 position of the left breast which is mobile. I have discussed options for intervention with the patient as an outpatient. She has decided she wishes to have a left mastectomy and is not interested in  reconstruction. She is brought to the operating room electively.   Operative Findings:       I could palpate the tumor in the left breast at 12:00 position. I was able to make my incision above this and it did not appear to be invading the pectoralis muscle. I found 2 sentinel lymph nodes which were small and not grossly pathologic.  Procedure in Detail:          The patient underwent injection of radionuclide into the left breast in the  holding area by the nuclear medicine technician. The patient was taken to the operating room. Gen. Anesthesia was induced. Intravenous antibiotics were given. Surgical time out was performed. Following alcohol prep I injected 5 cc of blue dye into the left breast, subareolar area, and massaged the breast for 5 minutes.  Left breast and chest wall and axilla were then prepped and draped in a sterile fashion. Using a marking pen I marked the margins and anatomic boundaries of the breast and then marked at transverse elliptical incision which was slightly lower medially and slightly higher laterally to help make the incision above the palpable tumor. The transverse elliptical incision was made, which completely removed the nipple and areolar complex. Skin flaps were raised superiorly to the infraclavicular area, medially to the parasternal area, inferiorly to the anterior rectus sheath and laterally to the latissimus dorsi muscle. The breast was dissected off of the pectoralis major and pectoralis minor muscle with electrocautery. I used a silk suture to mark the lateral skin margin. The neoprobe was used to this identify the sentinel  nodes and I found two very hot, very blue lymph nodes and these were removed. They did not look pathologic. After these were removed there really was almost no radioactivity left. Hemostasis was excellent and achieved with electrocautery. All the specimens were passed off the field. Two 19 French Blake drains were placed; one up into the left axillary area and one across the skin flaps. These were brought out through separate stab incisions infralaterally, sutured to the skin with nylon sutures and connected to suction bulbs. The wound was irrigated with saline one more time. There was no bleeding. The subcutaneous tissue was closed with numerous interrupted sutures of 3-0 Vicryl and the skin closed with a running subcuticular suture of 4-0 Monocryl and Steri-Strips. A clean occlusive  bandage was placed. Breast binder was placed. The patient tolerated the procedure well was taken to recovery in a stable condition. Estimated blood loss 50 cc. Counts correct. Complications none.     Angelia Mould. Derrell Lolling, M.D., FACS General and Minimally Invasive Surgery Breast and Colorectal Surgery  08/12/2011 9:16 AM

## 2011-08-13 DIAGNOSIS — C50919 Malignant neoplasm of unspecified site of unspecified female breast: Secondary | ICD-10-CM | POA: Diagnosis not present

## 2011-08-13 DIAGNOSIS — E785 Hyperlipidemia, unspecified: Secondary | ICD-10-CM | POA: Diagnosis not present

## 2011-08-13 DIAGNOSIS — Z01812 Encounter for preprocedural laboratory examination: Secondary | ICD-10-CM | POA: Diagnosis not present

## 2011-08-13 DIAGNOSIS — Z01818 Encounter for other preprocedural examination: Secondary | ICD-10-CM | POA: Diagnosis not present

## 2011-08-13 MED ORDER — ENSURE IMMUNE HEALTH PO LIQD
237.0000 mL | Freq: Three times a day (TID) | ORAL | Status: DC
  Administered 2011-08-13: 237 mL via ORAL

## 2011-08-13 NOTE — Progress Notes (Signed)
1 Day Post-Op  Subjective: Pt feels weak. Not much appetite and hasn't been OOB. No N/V. Pain control good. Voiding ok.  Objective: Vital signs in last 24 hours: Temp:  [96.9 F (36.1 C)-98.5 F (36.9 C)] 98.5 F (36.9 C) (03/02 0555) Pulse Rate:  [60-69] 68  (03/02 0555) Resp:  [8-41] 20  (03/02 0555) BP: (101-145)/(41-70) 109/41 mmHg (03/02 0555) SpO2:  [92 %-100 %] 96 % (03/02 0555) FiO2 (%):  [2 %] 2 % (03/01 1700) Weight:  [46.9 kg (103 lb 6.3 oz)] 46.9 kg (103 lb 6.3 oz) (03/02 0202) Last BM Date: 08/12/11  Intake/Output this shift:    Physical Exam: BP 109/41  Pulse 68  Temp(Src) 98.5 F (36.9 C) (Oral)  Resp 20  Ht 5\' 5"  (1.651 m)  Wt 46.9 kg (103 lb 6.3 oz)  BMI 17.21 kg/m2  SpO2 96% (L)mastectomy site c/d/i, no hematoma Drains intact, SS output.  Labs: CBC No results found for this basename: WBC:2,HGB:2,HCT:2,PLT:2 in the last 72 hours BMET No results found for this basename: NA:2,K:2,CL:2,CO2:2,GLUCOSE:2,BUN:2,CREATININE:2,CALCIUM:2 in the last 72 hours LFT No results found for this basename: PROT,ALBUMIN,AST,ALT,ALKPHOS,BILITOT,BILIDIR,IBILI,LIPASE in the last 72 hours PT/INR No results found for this basename: LABPROT:2,INR:2 in the last 72 hours ABG No results found for this basename: PHART:2,PCO2:2,PO2:2,HCO3:2 in the last 72 hours  Studies/Results: Nm Sentinel Node Inj-no Rpt (breast)  08/12/2011  CLINICAL DATA: cancer left breast   Sulfur colloid was injected intradermally by the nuclear medicine  technologist for breast cancer sentinel node localization.      Assessment:   Procedure(s): (L)MASTECTOMY WITH SENTINEL LYMPH NODE BIOPSY  Plan: Encourage appetite and activity. Will offer Ensure as well. Not ready for discharge at present time.  LOS: 1 day    Alyse Low 08/13/2011 9:01 AM

## 2011-08-13 NOTE — Progress Notes (Signed)
I have seen and examined the patient and agree with the assessment and plans.  Eytan Carrigan A. Jillienne Egner  MD, FACS  

## 2011-08-14 DIAGNOSIS — E785 Hyperlipidemia, unspecified: Secondary | ICD-10-CM | POA: Diagnosis not present

## 2011-08-14 DIAGNOSIS — Z01812 Encounter for preprocedural laboratory examination: Secondary | ICD-10-CM | POA: Diagnosis not present

## 2011-08-14 DIAGNOSIS — Z01818 Encounter for other preprocedural examination: Secondary | ICD-10-CM | POA: Diagnosis not present

## 2011-08-14 DIAGNOSIS — C50919 Malignant neoplasm of unspecified site of unspecified female breast: Secondary | ICD-10-CM | POA: Diagnosis not present

## 2011-08-14 NOTE — Discharge Instructions (Addendum)
[facility] [place] °[phone] ° °MASTECTOMY: POST OP INSTRUCTIONS ° °Always review your discharge instruction sheet given to you by the facility where your surgery was performed. °IF YOU HAVE DISABILITY OR FAMILY LEAVE FORMS, YOU MUST BRING THEM TO THE OFFICE FOR PROCESSING.   °DO NOT GIVE THEM TO YOUR DOCTOR. °A prescription for pain medication may be given to you upon discharge.  Take your pain medication as prescribed, if needed.  If narcotic pain medicine is not needed, then you may take acetaminophen (Tylenol) or ibuprofen (Advil) as needed. °1. Take your usually prescribed medications unless otherwise directed. °2. If you need a refill on your pain medication, please contact your pharmacy.  They will contact our office to request authorization.  Prescriptions will not be filled after 5pm or on week-ends. °3. You should follow a light diet the first few days after arrival home, such as soup and crackers, etc.  Resume your normal diet the day after surgery. °4. Most patients will experience some swelling and bruising on the chest and underarm.  Ice packs will help.  Swelling and bruising can take several days to resolve.  °5. It is common to experience some constipation if taking pain medication after surgery.  Increasing fluid intake and taking a stool softener (such as Colace) will usually help or prevent this problem from occurring.  A mild laxative (Milk of Magnesia or Miralax) should be taken according to package instructions if there are no bowel movements after 48 hours. °6. Unless discharge instructions indicate otherwise, leave your bandage dry and in place until your next appointment in 3-5 days.  You may take a limited sponge bath.  No tube baths or showers until the drains are removed.  You may have steri-strips (small skin tapes) in place directly over the incision.  These strips should be left on the skin for 7-10 days.  If your surgeon used skin glue on the incision, you may  shower in 24 hours.  The glue will flake off over the next 2-3 weeks.  Any sutures or staples will be removed at the office during your follow-up visit. °7. DRAINS:  If you have drains in place, it is important to keep a list of the amount of drainage produced each day in your drains.  Before leaving the hospital, you should be instructed on drain care.  Call our office if you have any questions about your drains. °8. ACTIVITIES:  You may resume regular (light) daily activities beginning the next day--such as daily self-care, walking, climbing stairs--gradually increasing activities as tolerated.  You may have sexual intercourse when it is comfortable.  Refrain from any heavy lifting or straining until approved by your doctor. °a. You may drive when you are no longer taking prescription pain medication, you can comfortably wear a seatbelt, and you can safely maneuver your car and apply brakes. °b. RETURN TO WORK:  __________________________________________________________ °9. You should see your doctor in the office for a follow-up appointment approximately 3-5 days after your surgery.  Your doctor’s nurse will typically make your follow-up appointment when she calls you with your pathology report.  Expect your pathology report 2-3 business days after your surgery.  You may call to check if you do not hear from us after three days.   °10. OTHER INSTRUCTIONS: ______________________________________________________________________________________________ ____________________________________________________________________________________________ °WHEN TO CALL YOUR DOCTOR: °1. Fever over 101.0 °2. Nausea and/or vomiting °3. Extreme swelling or bruising °4. Continued bleeding from incision. °5. Increased pain, redness, or drainage from the incision. °  The clinic staff is available to answer your questions during regular business hours.  Please don’t hesitate to call and ask to speak to one of the nurses for clinical  concerns.  If you have a medical emergency, go to the nearest emergency room or call 911.  A surgeon from [facility] is always on call at the hospital. °[address], [address], [place], [place]  [address] ? [address] [address], [place], [place]   [address] °[phone] ? [phone] ? FAX [phone] ° °

## 2011-08-14 NOTE — Discharge Summary (Signed)
Physician Discharge Summary  Patient ID: NAPHTALI RIEDE MRN: [id number] DOB/AGE: [birth year] 76 y.o.  Admit date: 08/12/2011 Discharge date: 08/14/2011  Admission Diagnoses: (L)breast cancer Discharge Diagnoses:  (L)breast cancer   Procedures: Procedure(s): (L)MASTECTOMY WITH SENTINEL LYMPH NODE BIOPSY  Discharged Condition: good  Hospital Course: HPI  Cassidy Martinez is a 78 y.o. female. She was referred by Dr. Lance Coon at [facility] and is referred for evaluation and management of an invasive cancer of the left breast at the 12:00 position.  This patient had a right partial mastectomy by Dr. 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  She felt a lump in her left breast in December. In January she had mammograms, ultrasounds, a biopsy and a MRI. The mammograms and ultrasound showed a 1.9 cm spiculated mass in the left breast at the 12:00 position consistent with the palpable mass. Image guided biopsy showed invasive ductal carcinoma, ER 100% positive, PR-negative, HER-2-negative, Ki-67 13%. MRI shows a 2.5 cm mass in the left breast at 12:00 position. There were no other abnormalities.  Dr. Derrell Lolling made the decision for elective (L)mastectomy. She was admitted on 3/1 and was taken to the OR for (L)mastectomy with (L)SLN bx. She tolerated this well. She felt a little weak on POD #1 but gradually increased her activity. No other complications were encountered and she was stable for discharge on POD#2.  Consults: None   Discharge Exam: Blood pressure 129/60, pulse 59, temperature 99 F (37.2 C), temperature source Oral, resp. rate 18, height 5\' 5"  (1.651 m), weight 46.9 kg (103 lb 6.3 oz), SpO2 97.00%. Lungs: CTA without w/r/r Heart: Regular Breast: appropriately tender   Incision  c/d/i without erythema or hematoma.  Drain intact. Ext: No edema or tenderness   Disposition: 01-Home or Self Care  Discharge Orders    Future Appointments: Provider:  Department: Dept Phone: Center:   08/22/2011 2:15 PM Ernestene Mention, MD [facility] [phone] None     Future Orders Please Complete By Expires   Diet - low sodium heart healthy      Diet - low sodium heart healthy      Increase activity slowly      Discharge instructions      Comments:   Drink lots of fluids. Eat a healthy diet.  Keep a written record of the drainage and bring that to the office with you.  Call Dr. Derrell Lolling at the office and make an appointment to see him in approximately one week.   Leave dressing on - Keep it clean, dry, and intact until clinic visit      Call MD for:  persistant dizziness or light-headedness      Call MD for:  hives      Call MD for:  difficulty breathing, headache or visual disturbances      Call MD for:  redness, tenderness, or signs of infection (pain, swelling, redness, odor or green/yellow discharge around incision site)      Call MD for:  severe uncontrolled pain      Call MD for:  persistant nausea and vomiting      Call MD for:  temperature >100.4      Increase activity slowly      Change dressing (specify)      Comments:   Dressing change: Keep a simple clean dressing, like a sanitary pad over incision.   Call MD for:  redness, tenderness, or signs of infection (pain, swelling, redness,  odor or green/yellow discharge around incision site)      Call MD for:  severe uncontrolled pain      Call MD for:  temperature >100.4        Medication List  As of 08/14/2011 11:14 AM   TAKE these medications         CALCIUM PO   Take 1,000 mg by mouth daily.      carboxymethylcellulose 1 % ophthalmic solution   Apply 1 drop to eye 2 (two) times daily.      FOLIC ACID PO   Take 1 tablet by mouth daily.      HYDROcodone-acetaminophen 5-325 MG per tablet   Commonly known as: NORCO   Take 1-2 tablets by mouth every 4 (four) hours as needed for pain.      NON FORMULARY   Take 1 capsule by mouth 4 (four) times daily. [employer] Essential       RED YEAST RICE PO   Take 1 capsule by mouth 2 (two) times daily.      VITAMIN C PO   Take 600 mg by mouth daily.      VITAMIN D-3 PO   Take 1 capsule by mouth 2 (two) times daily.           Follow-up Information    Follow up with Ernestene Mention, MD. Schedule an appointment as soon as possible for a visit in 1 week.   Contact information:   [employer] [facility] [place] [address], [address] [place] [address] [phone]          Signed: Alyse Low 08/14/2011, 11:14 AM

## 2011-08-14 NOTE — Progress Notes (Signed)
Reviewed JP drain education with pt and husband.  Husband successfully demonstrated how to empty JP drain and re-compress bulb, how to strip drain tubing and understands how to record amount of drainage to take back to Dr. Derrell Lolling at the follow up appt.   Reviewed educational materials with pt regarding:  Left arm precautions, Breast Cancer support groups available, the ABC class (once approved by Dr. Derrell Lolling), bag given.

## 2011-08-14 NOTE — Progress Notes (Signed)
2 Days Post-Op  Subjective: Pt ok. Feels better this am. Some soreness under arm.  Objective: Vital signs in last 24 hours: Temp:  [98.1 F (36.7 C)-99.1 F (37.3 C)] 99 F (37.2 C) (03/03 0547) Pulse Rate:  [57-72] 59  (03/03 0547) Resp:  [16-18] 18  (03/03 0547) BP: (110-139)/(38-62) 129/60 mmHg (03/03 0547) SpO2:  [94 %-99 %] 97 % (03/03 0547) Last BM Date: 08/12/11  Intake/Output this shift:    Physical Exam: BP 129/60  Pulse 59  Temp(Src) 99 F (37.2 C) (Oral)  Resp 18  Ht 5\' 5"  (1.651 m)  Wt 46.9 kg (103 lb 6.3 oz)  BMI 17.21 kg/m2  SpO2 97% Incision c/d/i no hematoma Drains intact, SS output.  Labs: CBC No results found for this basename: WBC:2,HGB:2,HCT:2,PLT:2 in the last 72 hours BMET No results found for this basename: NA:2,K:2,CL:2,CO2:2,GLUCOSE:2,BUN:2,CREATININE:2,CALCIUM:2 in the last 72 hours LFT No results found for this basename: PROT,ALBUMIN,AST,ALT,ALKPHOS,BILITOT,BILIDIR,IBILI,LIPASE in the last 72 hours PT/INR No results found for this basename: LABPROT:2,INR:2 in the last 72 hours ABG No results found for this basename: PHART:2,PCO2:2,PO2:2,HCO3:2 in the last 72 hours  Studies/Results: No results found.  Assessment: Breast mass   Procedure(s): (L)MASTECTOMY WITH SENTINEL LYMPH NODE BIOPSY  Plan: Dc home today Drain teaching  LOS: 2 days    Alyse Low 08/14/2011 8:56 AM

## 2011-08-15 ENCOUNTER — Encounter (HOSPITAL_COMMUNITY): Payer: Self-pay | Admitting: General Surgery

## 2011-08-16 ENCOUNTER — Telehealth (INDEPENDENT_AMBULATORY_CARE_PROVIDER_SITE_OTHER): Payer: Self-pay

## 2011-08-16 NOTE — Telephone Encounter (Signed)
Pt home doing well. Po appt made. 

## 2011-08-17 ENCOUNTER — Telehealth (INDEPENDENT_AMBULATORY_CARE_PROVIDER_SITE_OTHER): Payer: Self-pay | Admitting: General Surgery

## 2011-08-17 NOTE — Telephone Encounter (Signed)
Pts husband given path result and he will give this to wife.

## 2011-08-22 ENCOUNTER — Encounter (INDEPENDENT_AMBULATORY_CARE_PROVIDER_SITE_OTHER): Payer: Self-pay | Admitting: General Surgery

## 2011-08-22 ENCOUNTER — Ambulatory Visit (INDEPENDENT_AMBULATORY_CARE_PROVIDER_SITE_OTHER): Payer: Medicare Other | Admitting: General Surgery

## 2011-08-22 VITALS — BP 130/56 | HR 61 | Temp 98.8°F | Ht 65.0 in | Wt 101.6 lb

## 2011-08-22 DIAGNOSIS — C50919 Malignant neoplasm of unspecified site of unspecified female breast: Secondary | ICD-10-CM

## 2011-08-22 DIAGNOSIS — C50912 Malignant neoplasm of unspecified site of left female breast: Secondary | ICD-10-CM

## 2011-08-22 NOTE — Progress Notes (Signed)
Subjective:     Patient ID: Cassidy Martinez, female   DOB: [birth year], 78 y.o.   MRN: [id number]  HPI This patient underwent left total mastectomy and sentinel node biopsy on August 12, 2011. Final pathology report showed a 2.2 cm high-grade IDC. Both sentinel nodes are negative. ER 100%. PR 0%. HER-2 negative. Pathologic stage T2, N0.  She is feeling fairly well. Soreness is going away. Drainage is down to less than 15 cc per day.  Review of Systems     Objective:   Physical Exam Patient looks well. In no distress.  Left mastectomy wound looks healthy. No signs of infection. Skin flaps are viable. No necrosis. Drainage is light. Both drains were removed.    Assessment:     Invasive ductal carcinoma left breast, pathologic stage T2, N0, receptor positive, HER-2/neu negative.  Doing well in the early postop period following left mastectomy and sentinel node biopsy.    Plan:     Referral to physical therapy performed today. Range of motion exercises left shoulder discussed and demonstrated.  Referral to medical oncology will be made today.  Return to see me in 3-4 weeks.    Angelia Mould. Derrell Lolling, M.D., [facility] General and Minimally invasive Surgery Breast and Colorectal Surgery Office:   [phone] Pager:   [phone]

## 2011-08-22 NOTE — Patient Instructions (Signed)
Your left mastectomy wound is healing nicely without any complications. We removed your drains today. You are encouraged to increase range of motion of the left shoulder. You will be given a referral to physical therapy.  You may begin driving your car when you feel comfortable.  You may start taking a shower tomorrow.  You will be referred to a medical oncologist to discuss whether any other forms of treatment were indicated.  Return to see Dr. Derrell Lolling in 4 weeks.

## 2011-08-24 ENCOUNTER — Telehealth: Payer: Self-pay | Admitting: *Deleted

## 2011-08-24 NOTE — Telephone Encounter (Signed)
Confirmed 09/01/11 appt w/ pt.  Mailed before letter & packet to pt.  Gave paperwork to med rec for chart.

## 2011-08-30 ENCOUNTER — Other Ambulatory Visit: Payer: Self-pay | Admitting: *Deleted

## 2011-08-30 DIAGNOSIS — Z853 Personal history of malignant neoplasm of breast: Secondary | ICD-10-CM | POA: Insufficient documentation

## 2011-08-30 DIAGNOSIS — C50419 Malignant neoplasm of upper-outer quadrant of unspecified female breast: Secondary | ICD-10-CM

## 2011-09-01 ENCOUNTER — Ambulatory Visit: Payer: Medicare Other

## 2011-09-01 ENCOUNTER — Ambulatory Visit (HOSPITAL_BASED_OUTPATIENT_CLINIC_OR_DEPARTMENT_OTHER): Payer: Medicare Other | Admitting: Oncology

## 2011-09-01 ENCOUNTER — Other Ambulatory Visit (HOSPITAL_BASED_OUTPATIENT_CLINIC_OR_DEPARTMENT_OTHER): Payer: Medicare Other | Admitting: Lab

## 2011-09-01 ENCOUNTER — Telehealth: Payer: Self-pay | Admitting: Oncology

## 2011-09-01 VITALS — BP 151/71 | HR 66 | Temp 97.6°F | Ht 65.0 in | Wt 102.1 lb

## 2011-09-01 DIAGNOSIS — C50419 Malignant neoplasm of upper-outer quadrant of unspecified female breast: Secondary | ICD-10-CM | POA: Diagnosis not present

## 2011-09-01 DIAGNOSIS — Z171 Estrogen receptor negative status [ER-]: Secondary | ICD-10-CM | POA: Diagnosis not present

## 2011-09-01 DIAGNOSIS — C50919 Malignant neoplasm of unspecified site of unspecified female breast: Secondary | ICD-10-CM | POA: Diagnosis not present

## 2011-09-01 LAB — CBC WITH DIFFERENTIAL/PLATELET
BASO%: 1.9 % (ref 0.0–2.0)
Basophils Absolute: 0.2 10*3/uL — ABNORMAL HIGH (ref 0.0–0.1)
EOS%: 5.7 % (ref 0.0–7.0)
MONO%: 6.7 % (ref 0.0–14.0)
NEUT#: 5.4 10*3/uL (ref 1.5–6.5)
NEUT%: 61.6 % (ref 38.4–76.8)
Platelets: 254 10*3/uL (ref 145–400)
WBC: 8.8 10*3/uL (ref 3.9–10.3)
lymph#: 2.1 10*3/uL (ref 0.9–3.3)

## 2011-09-01 LAB — COMPREHENSIVE METABOLIC PANEL
ALT: 16 U/L (ref 0–35)
AST: 37 U/L (ref 0–37)
Alkaline Phosphatase: 82 U/L (ref 39–117)
BUN: 21 mg/dL (ref 6–23)
Chloride: 103 mEq/L (ref 96–112)
Creatinine, Ser: 0.82 mg/dL (ref 0.50–1.10)
Total Bilirubin: 0.5 mg/dL (ref 0.3–1.2)

## 2011-09-01 MED ORDER — LETROZOLE 2.5 MG PO TABS: 2.5000 mg | ORAL_TABLET | Freq: Every day | ORAL | Status: AC

## 2011-09-01 NOTE — Telephone Encounter (Signed)
gve the pt her June 2013 appt calendar. Along with the appt for the bone density@[place]  reg. S/w judy from the physical and rehab dept at [facility] and they wanted [place] to fax over the work orders and they will contact the pt directly with the appt. Faxed over the orders to [phone] and the pt is aware of the process.

## 2011-09-01 NOTE — Patient Instructions (Signed)
Letrozole tablets What is this medicine? LETROZOLE (LET roe zole) blocks the production of estrogen. Certain types of breast cancer grow under the influence of estrogen. Letrozole helps block tumor growth. This medicine is used to treat advanced breast cancer in postmenopausal women. This medicine may be used for other purposes; ask your health care provider or pharmacist if you have questions. What should I tell my health care provider before I take this medicine? They need to know if you have any of these conditions: -liver disease -osteoporosis (weak bones) -an unusual or allergic reaction to letrozole, other medicines, foods, dyes, or preservatives -pregnant or trying to get pregnant -breast-feeding How should I use this medicine? Take this medicine by mouth with a glass of water. You may take it with or without food. Follow the directions on the prescription label. Take your medicine at regular intervals. Do not take your medicine more often than directed. Do not stop taking except on your doctor's advice. Talk to your pediatrician regarding the use of this medicine in children. Special care may be needed. Overdosage: If you think you have taken too much of this medicine contact a poison control center or emergency room at once. NOTE: This medicine is only for you. Do not share this medicine with others. What if I miss a dose? If you miss a dose, take it as soon as you can. If it is almost time for your next dose, take only that dose. Do not take double or extra doses. What may interact with this medicine? Do not take this medicine with any of the following medications: -estrogens, like hormone replacement therapy or birth control pills This medicine may also interact with the following medications: -dietary supplements such as androstenedione or DHEA -prasterone -tamoxifen This list may not describe all possible interactions. Give your health care provider a list of all the medicines,  herbs, non-prescription drugs, or dietary supplements you use. Also tell them if you smoke, drink alcohol, or use illegal drugs. Some items may interact with your medicine. What should I watch for while using this medicine? Visit your doctor or health care professional for regular check-ups to monitor your condition. Do not use this drug if you are pregnant. Serious side effects to an unborn child are possible. Talk to your doctor or pharmacist for more information. You may get drowsy or dizzy. Do not drive, use machinery, or do anything that needs mental alertness until you know how this medicine affects you. Do not stand or sit up quickly, especially if you are an older patient. This reduces the risk of dizzy or fainting spells. What side effects may I notice from receiving this medicine? Side effects that you should report to your doctor or health care professional as soon as possible: -allergic reactions like skin rash, itching, or hives -bone fracture -chest pain -difficulty breathing or shortness of breath -severe pain, swelling, warmth in the leg -unusually weak or tired -vaginal bleeding Side effects that usually do not require medical attention (report to your doctor or health care professional if they continue or are bothersome): -bone, back, joint, or muscle pain -dizziness -fatigue -fluid retention -headache -hot flashes, night sweats -nausea -weight gain This list may not describe all possible side effects. Call your doctor for medical advice about side effects. You may report side effects to FDA at 1-800-FDA-1088. Where should I keep my medicine? Keep out of the reach of children. Store between 15 and 30 degrees C (59 and 86 degrees F). Throw away   any unused medicine after the expiration date. NOTE: This sheet is a summary. It may not cover all possible information. If you have questions about this medicine, talk to your doctor, pharmacist, or health care provider.  2012,  Elsevier/Gold Standard. (08/10/2007 4:43:44 PM)  1. You will start letrozole 2.5 mg daily,  2. I will set up physical therapy appointment for you  3. Bone density appointment  4. See Dr. Welton Flakes back in 3 months  5. If you need to see [place] sooner please call [phone] and ask for Dr. Milta Deiters nurse

## 2011-09-02 ENCOUNTER — Telehealth: Payer: Self-pay | Admitting: Oncology

## 2011-09-05 ENCOUNTER — Encounter: Payer: Self-pay | Admitting: Oncology

## 2011-09-05 ENCOUNTER — Encounter: Payer: Self-pay | Admitting: *Deleted

## 2011-09-05 ENCOUNTER — Ambulatory Visit: Payer: Medicare Other

## 2011-09-05 NOTE — Progress Notes (Signed)
Cassidy Martinez [id number] 1931/08/08 78 y.o. 09/05/2011 6:13 PM  CC  Dr. Hope Budds, MD, MD [address] [place] [address]  REASON FOR CONSULTATION:  78 year old female with new diagnosis of invasive ductal carcinoma of the left breast. The tumor measured 1.9 cm at the 12:00 position ER +100% PR negative HER-2/neu negative proliferation marker 13%. Patient is status post left mastectomy.  REFERRING PHYSICIAN: Dr. Claud Kelp  HISTORY OF PRESENT ILLNESS:  Cassidy Martinez is a 78 y.o. female.  From [facility] with medical history significant for hyperlipidemia arthritis and previous history of atypical ductal hyperplasia back in December 2010 of the right breast at which time she underwent a right partial mastectomy by Dr. Claud Kelp. Most recently patient felt a mass in her left breast. She went on to have mammograms performed in January. The mammogram and ultrasound showed a 1.9 cm mass in the left breast at the 12:00 position. She then had needle core biopsy performed with the pathology showing an invasive ductal carcinoma that was ER +100% PR negative HER-2/neu negative with a proliferation marker 13%. She had MRI of the breasts performed the MRI of the left breast confirmed a 2.5 cm mass no other abnormalities. She then went on to have a left mastectomy performed On 08/12/2011. The final pathology revealed a 2.2 cm invasive ductal carcinoma with associated high-grade DCIS one sentinel node was negative for metastatic disease. Patient is now seen in medical oncology for discussion of adjuvant treatment.   Past Medical History: Past Medical History  Diagnosis Date  . Hyperlipidemia   . PONV (postoperative nausea and vomiting)   . Arthritis     hands  . Heart murmur   . History of UTI     "have had a few over the years; haven't had once since ~ 2007"  . Carpal tunnel syndrome, bilateral   . Migraines     "used to have them  severe; stopped when I took self off estrogen"  . Breast CA     left;; S/P mastectomy 08/12/11    Past Surgical History: Past Surgical History  Procedure Date  . Nasal sinus surgery   . Carpal tunnel release     right; "I've had it operated on twice"  . Scalp mass removal 2007    "knot; benign"  . Mastectomy complete / simple w/ sentinel node biopsy 08/12/11    axillary SNB; left  . Appendectomy   . Breast lumpectomy 03/2009    "abnormal cells"; right  . Abdominal hysterectomy ~ 1983  . Eye surgery 2006    repair of tear in retina  . Mastectomy w/ sentinel node biopsy 08/12/2011    Procedure: MASTECTOMY WITH SENTINEL LYMPH NODE BIOPSY;  Surgeon: Ernestene Mention, MD;  Location: [facility] OR;  Service: General;  Laterality: Left;    Family History: Family History  Problem Relation Age of Onset  . Stroke Mother   . Cancer Mother     ovarian  . Arthritis Father   . Cancer Maternal Aunt     breast  . Cancer Paternal Aunt     breast  . Anesthesia problems Neg Hx     Social History History  Substance Use Topics  . Smoking status: Never Smoker   . Smokeless tobacco: Never Used  . Alcohol Use: No    Allergies: Allergies  Allergen Reactions  . Sulfa Antibiotics Itching    Starts at feet and rises up the body.  Current Medications: Current Outpatient Prescriptions  Medication Sig Dispense Refill  . Ascorbic Acid (VITAMIN C PO) Take 600 mg by mouth daily.      Marland Kitchen CALCIUM PO Take 1,000 mg by mouth daily.      . carboxymethylcellulose 1 % ophthalmic solution Apply 1 drop to eye 2 (two) times daily.      . Cholecalciferol (VITAMIN D-3 PO) Take 1 capsule by mouth 2 (two) times daily.       Marland Kitchen FOLIC ACID PO Take 1 tablet by mouth daily.      . NON FORMULARY Take 1 capsule by mouth 4 (four) times daily. Vision Essential      . Red Yeast Rice Extract (RED YEAST RICE PO) Take 1 capsule by mouth 2 (two) times daily.      Marland Kitchen letrozole (FEMARA) 2.5 MG tablet Take 1 tablet (2.5 mg total)  by mouth daily.  30 tablet  12    OB/GYN History:Menarche at age 78 she is postmenopausal she has never had any pregnancies no hormone replacement therapy. Her last colonoscopy was in 2003 she had her last bone density in 2007 appear  ECOG PERFORMANCE STATUS: 1 - Symptomatic but completely ambulatory  Genetic Counseling/testing: Not Applicable  REVIEW OF SYSTEMS:  Constitutional: positive for fatigue Eyes: positive for irritation Ears, nose, mouth, throat, and face: positive for patient does have a little bit of soreness in her throat Respiratory: negative Cardiovascular: negative Gastrointestinal: negative Integument/breast: positive for she does have a little bit of tenderness in the mastectomy site on the left right breast is without any masses or nipple discharge. She also complains of some difficulty with a duct in her arm on the left Hematologic/lymphatic: negative Musculoskeletal:positive for positive for difficulty in moving the left shoulder since the surgery Neurological: negative  PHYSICAL EXAMINATION: Blood pressure 151/71, pulse 66, temperature 97.6 F (36.4 C), height 5\' 5"  (1.651 m), weight 102 lb 1.6 oz (46.312 kg).  ZOX:WRUEA, healthy and no distress SKIN: skin color, texture, turgor are normal HEAD: No masses, lesions, tenderness or abnormalities EYES: PERRLA, EOMI, Conjunctiva are pink and non-injected EARS: External ears normal OROPHARYNX:no exudate, no erythema and lips, buccal mucosa, and tongue normal  NECK: supple, no adenopathy, thyroid normal size, non-tender, without nodularity LYMPH:  no palpable lymphadenopathy, no hepatosplenomegaly BREAST:Left mastectomy scar looks healing without any evidence of irritation or infection right breast no masses nipple discharge well-healed previous lumpectomy scar LUNGS: clear to auscultation and percussion HEART: regular rate & rhythm and no murmurs ABDOMEN:abdomen soft, non-tender, normal bowel sounds and no masses  or organomegaly BACK: Back symmetric, no curvature. EXTREMITIES:no edema, no clubbing, no cyanosis  NEURO: alert & oriented x 3 with fluent speech, no focal motor/sensory deficits, gait normal     STUDIES/RESULTS: Nm Sentinel Node Inj-no Rpt (breast)  08/12/2011  CLINICAL DATA: cancer left breast   Sulfur colloid was injected intradermally by the nuclear medicine  technologist for breast cancer sentinel node localization.       LABS:    Chemistry      Component Value Date/Time   NA 141 09/01/2011 1249   K 3.9 09/01/2011 1249   CL 103 09/01/2011 1249   CO2 29 09/01/2011 1249   BUN 21 09/01/2011 1249   CREATININE 0.82 09/01/2011 1249      Component Value Date/Time   CALCIUM 10.1 09/01/2011 1249   ALKPHOS 82 09/01/2011 1249   AST 37 09/01/2011 1249   ALT 16 09/01/2011 1249   BILITOT 0.5 09/01/2011 1249  Lab Results  Component Value Date   WBC 8.8 09/01/2011   HGB 13.8 09/01/2011   HCT 41.5 09/01/2011   MCV 91.4 09/01/2011   PLT 254 09/01/2011       PATHOLOGY: ADDITIONAL INFORMATION: 1. CHROMOGENIC IN-SITU HYBRIDIZATION Interpretation HER-2/NEU BY CISH - NO AMPLIFICATION OF HER-2 DETECTED. THE RATIO OF HER-2: CEP 17 SIGNALS WAS 1.15. Reference range: Ratio: HER2:CEP17 < 1.8 - gene amplification not observed Ratio: HER2:CEP 17 1.8-2.2 - equivocal result Ratio: HER2:CEP17 > 2.2 - gene amplification observed Abigail Miyamoto MD Pathologist, Electronic Signature ( Signed 08/18/2011) FINAL DIAGNOSIS Diagnosis 1. Breast, simple mastectomy, left - INVASIVE GRADE II DUCTAL CARCINOMA WITH ASSOCIATED CALCIFICATION, SPANNING 2.2 CM. - HIGH GRADE DUCTAL CARCINOMA IN SITU PRESENT. - LYMPH/VASCULAR INVASION NOT IDENTIFIED. - MARGINS ARE NEGATIVE. - SEE ONCOLOGY TEMPLATE. 2. Lymph node, sentinel, biopsy, left axillary #1 - ONE BENIGN LYMPH NODE WITH NO TUMOR SEEN (0/1). 3. Lymph node, sentinel, biopsy, left axillary #2 - ONE BENIGN LYMPH NODE WITH NO TUMOR SEEN (0/1). 1 of  3 FINAL for CARALYN, TWINING ([id number]) Microscopic Comment 1. BREAST, INVASIVE TUMOR, WITH LYMPH NODE SAMPLING Specimen, including laterality: Left breast with sentinel lymph nodes. Procedure: Left simple mastectomy with sentinel lymph nodes. Grade: II. Tubule formation: 3. Nuclear pleomorphism: 2. Mitotic: 1. Tumor size (gross measurement): 2.2 cm. Margins: Invasive, distance to closest margin: 0.15 cm. In-situ, distance to closest margin: 0.8 cm. Lymphovascular invasion: Not identified. Ductal carcinoma in situ: Present. Grade: High grade. Extensive intraductal component: No. Lobular neoplasia: No. Tumor focality: Unifocal. Treatment effect: N/A. Extent of tumor: Tumor is confined to breast parenchyma. Lymph nodes: # examined: 2. Lymph nodes with metastasis: 0. Breast prognostic profile: Performed on previous case [id number] Estrogen receptor: 100%, positive. Progesterone receptor: 0%, negative. Her 2 neu: 1.33, not amplified. Ki-67: 13%, low. Non-neoplastic breast: Fibrocystic changes with associated calcification. Hyalinized fibroadenoma identified. TNM: pT2, pN0, MX. Comments: A Her-2/neu by FISH will be repeated on the current carcinoma. (RH:kh 08-15-11) Zandra Abts MD Pathologist, Electronic Signature  ASSESSMENT    78 year old female with  #1 new diagnosis of stage II invasive ductal carcinoma of the left breast status post mastectomy on 08/12/2011. The final pathology revealed an ER positive PR negative HER-2/neu negative invasive ductal carcinoma with a low proliferation marker of only 13%. Patient's overall prognosis I do think is quite good with just treatment with antiestrogen therapy such as letrozole.  #2 left frozen shoulder is concerning. We discussed physical therapy    PLAN:    #1 patient and I discussed antiestrogen therapy. I have recommended letrozole 2.5 mg daily adjuvantly. I do think she will tolerate this well and her overall outcome is  quite good. Risks and benefits of treatment were discussed with her in detail.  #2 left frozen shoulder is concerning I have recommended physical therapy and I have given her a referral to face outpatient physical therapy at [facility].  #3 We discussed getting her setup for a bone density scan as well.She will have this prior to her next visit.  #4 patient will be seen back in 3 months time in follow up.       Discussion: Patient is being treated per NCCN breast cancer care guidelines appropriate for stage.I   Thank you so much for allowing me to participate in the care of Fernande P Dollinger. I will continue to follow up the patient with you and assist in her care.  All questions were answered. The patient knows to  call the clinic with any problems, questions or concerns. We can certainly see the patient much sooner if necessary.  I spent 60 minutes counseling the patient face to face. The total time spent in the appointment was 60 minutes.  Drue Second, MD Medical/Oncology [facility] [phone] (beeper) [phone] (Office)  09/05/2011, 6:13 PM 09/05/2011, 6:13 PM

## 2011-09-05 NOTE — Progress Notes (Signed)
Mailed after appt letter to pt. 

## 2011-09-05 NOTE — Telephone Encounter (Signed)
x

## 2011-09-13 ENCOUNTER — Telehealth: Payer: Self-pay | Admitting: Medical Oncology

## 2011-09-13 ENCOUNTER — Other Ambulatory Visit: Payer: Self-pay | Admitting: Medical Oncology

## 2011-09-13 NOTE — Telephone Encounter (Signed)
[facility] with [facility] called requesting order for bone density test. She states pt may have an order but would like to get ahead. I faxed order to [phone]

## 2011-09-15 ENCOUNTER — Ambulatory Visit: Payer: Self-pay | Admitting: Oncology

## 2011-09-15 DIAGNOSIS — M899 Disorder of bone, unspecified: Secondary | ICD-10-CM | POA: Diagnosis not present

## 2011-09-15 DIAGNOSIS — C50919 Malignant neoplasm of unspecified site of unspecified female breast: Secondary | ICD-10-CM | POA: Diagnosis not present

## 2011-09-15 DIAGNOSIS — Z79899 Other long term (current) drug therapy: Secondary | ICD-10-CM | POA: Diagnosis not present

## 2011-09-15 DIAGNOSIS — Z13820 Encounter for screening for osteoporosis: Secondary | ICD-10-CM | POA: Diagnosis not present

## 2011-09-16 ENCOUNTER — Encounter: Payer: Self-pay | Admitting: Oncology

## 2011-09-16 DIAGNOSIS — M6281 Muscle weakness (generalized): Secondary | ICD-10-CM | POA: Diagnosis not present

## 2011-09-16 DIAGNOSIS — M25519 Pain in unspecified shoulder: Secondary | ICD-10-CM | POA: Diagnosis not present

## 2011-09-16 DIAGNOSIS — IMO0001 Reserved for inherently not codable concepts without codable children: Secondary | ICD-10-CM | POA: Diagnosis not present

## 2011-09-20 DIAGNOSIS — IMO0001 Reserved for inherently not codable concepts without codable children: Secondary | ICD-10-CM | POA: Diagnosis not present

## 2011-09-20 DIAGNOSIS — M6281 Muscle weakness (generalized): Secondary | ICD-10-CM | POA: Diagnosis not present

## 2011-09-20 DIAGNOSIS — M25519 Pain in unspecified shoulder: Secondary | ICD-10-CM | POA: Diagnosis not present

## 2011-09-22 ENCOUNTER — Ambulatory Visit (INDEPENDENT_AMBULATORY_CARE_PROVIDER_SITE_OTHER): Payer: Medicare Other | Admitting: General Surgery

## 2011-09-22 ENCOUNTER — Encounter (INDEPENDENT_AMBULATORY_CARE_PROVIDER_SITE_OTHER): Payer: Self-pay | Admitting: General Surgery

## 2011-09-22 VITALS — BP 138/70 | HR 60 | Temp 98.8°F | Resp 12 | Ht 65.0 in | Wt 102.0 lb

## 2011-09-22 DIAGNOSIS — M6281 Muscle weakness (generalized): Secondary | ICD-10-CM | POA: Diagnosis not present

## 2011-09-22 DIAGNOSIS — M25519 Pain in unspecified shoulder: Secondary | ICD-10-CM | POA: Diagnosis not present

## 2011-09-22 DIAGNOSIS — IMO0001 Reserved for inherently not codable concepts without codable children: Secondary | ICD-10-CM | POA: Diagnosis not present

## 2011-09-22 DIAGNOSIS — C50419 Malignant neoplasm of upper-outer quadrant of unspecified female breast: Secondary | ICD-10-CM

## 2011-09-22 NOTE — Patient Instructions (Signed)
Your left mastectomy wound has healed nicely. Your shoulder range of motion is almost back to normal. Please complete all the physical therapy.  You may return to work when you wish. There are no restrictions from my standpoint.  Please call me if you change your mind and decide  you want to see a plastic surgeon about reconstruction.  Return to see Dr. Derrell Lolling in 5-6 months.  We will plan a right breast mammogram in January of 2014.

## 2011-09-22 NOTE — Progress Notes (Signed)
Subjective:     Patient ID: Cassidy Martinez, female   DOB: 1932-04-13, 78 y.o.   MRN: [id number]  HPI This 78 year old Caucasian female underwent left total mastectomy and sentinel node biopsy on August 12, 2011. Final pathology report showed 2.2 cm invasive ductal carcinoma, zero out of two sentinel nodes positive, ER positive, PR-negative, HER-2-negative, pathologic stage T2 N0.  She had limited range of motion of her left shoulder but is essentially at 100% now. She still has physical therapy appointments. I told her she can start using 1-5 pounds in physical therapy.  She has seen Dr. Welton Flakes and is on letrozole. I told her I was in favor of that.  We talked about reconstructive issues. She is still reluctant to see a Engineer, petroleum but is thinking about it somewhat. Maybe later.  She allows has no complaints about her left mastectomy wound.  Review of Systems     Objective:   Physical Exam Patient looks well. Very fit for her age. No distress.  Extremities reveal 100% range of motion left shoulder. No arm swelling.  Left mastectomy wound well healed. No nodules no ulceration ribs are little bit prominent superiorly no axillary mass.    Assessment:     Invasive ductal carcinoma left breast, pathologic stage T2, N0, receptor positive, HER-2-negative.  Doing well 6 weeks postop left total mastectomy and sentinel node biopsy  Range of motion left shoulder has returned to normal.    Plan:     Continue letrozole  Continue physical therapy  She may return to work without restriction  Return to see me in 5-6 months  Right breast mammogram, January 2014.    Angelia Mould. Derrell Lolling, M.D., [facility] General and Minimally invasive Surgery Breast and Colorectal Surgery Office:   [phone] Pager:   [phone]

## 2011-09-26 DIAGNOSIS — M25519 Pain in unspecified shoulder: Secondary | ICD-10-CM | POA: Diagnosis not present

## 2011-09-26 DIAGNOSIS — M6281 Muscle weakness (generalized): Secondary | ICD-10-CM | POA: Diagnosis not present

## 2011-09-26 DIAGNOSIS — IMO0001 Reserved for inherently not codable concepts without codable children: Secondary | ICD-10-CM | POA: Diagnosis not present

## 2011-09-28 DIAGNOSIS — IMO0001 Reserved for inherently not codable concepts without codable children: Secondary | ICD-10-CM | POA: Diagnosis not present

## 2011-09-28 DIAGNOSIS — M6281 Muscle weakness (generalized): Secondary | ICD-10-CM | POA: Diagnosis not present

## 2011-09-28 DIAGNOSIS — M25519 Pain in unspecified shoulder: Secondary | ICD-10-CM | POA: Diagnosis not present

## 2011-09-29 ENCOUNTER — Encounter: Payer: Self-pay | Admitting: *Deleted

## 2011-09-29 NOTE — Progress Notes (Signed)
Pt called using [facility] as her Pharmacy.

## 2011-10-04 DIAGNOSIS — IMO0001 Reserved for inherently not codable concepts without codable children: Secondary | ICD-10-CM | POA: Diagnosis not present

## 2011-10-04 DIAGNOSIS — M6281 Muscle weakness (generalized): Secondary | ICD-10-CM | POA: Diagnosis not present

## 2011-10-04 DIAGNOSIS — M25519 Pain in unspecified shoulder: Secondary | ICD-10-CM | POA: Diagnosis not present

## 2011-10-06 DIAGNOSIS — M25519 Pain in unspecified shoulder: Secondary | ICD-10-CM | POA: Diagnosis not present

## 2011-10-06 DIAGNOSIS — IMO0001 Reserved for inherently not codable concepts without codable children: Secondary | ICD-10-CM | POA: Diagnosis not present

## 2011-10-06 DIAGNOSIS — M6281 Muscle weakness (generalized): Secondary | ICD-10-CM | POA: Diagnosis not present

## 2011-10-11 DIAGNOSIS — IMO0001 Reserved for inherently not codable concepts without codable children: Secondary | ICD-10-CM | POA: Diagnosis not present

## 2011-10-11 DIAGNOSIS — M6281 Muscle weakness (generalized): Secondary | ICD-10-CM | POA: Diagnosis not present

## 2011-10-11 DIAGNOSIS — M25519 Pain in unspecified shoulder: Secondary | ICD-10-CM | POA: Diagnosis not present

## 2011-10-12 ENCOUNTER — Encounter: Payer: Self-pay | Admitting: Oncology

## 2011-10-12 DIAGNOSIS — IMO0001 Reserved for inherently not codable concepts without codable children: Secondary | ICD-10-CM | POA: Diagnosis not present

## 2011-10-12 DIAGNOSIS — M6281 Muscle weakness (generalized): Secondary | ICD-10-CM | POA: Diagnosis not present

## 2011-10-12 DIAGNOSIS — M25519 Pain in unspecified shoulder: Secondary | ICD-10-CM | POA: Diagnosis not present

## 2011-10-13 DIAGNOSIS — M6281 Muscle weakness (generalized): Secondary | ICD-10-CM | POA: Diagnosis not present

## 2011-10-13 DIAGNOSIS — IMO0001 Reserved for inherently not codable concepts without codable children: Secondary | ICD-10-CM | POA: Diagnosis not present

## 2011-10-13 DIAGNOSIS — M25519 Pain in unspecified shoulder: Secondary | ICD-10-CM | POA: Diagnosis not present

## 2011-10-19 ENCOUNTER — Encounter: Payer: Self-pay | Admitting: Oncology

## 2011-10-27 ENCOUNTER — Other Ambulatory Visit: Payer: Self-pay | Admitting: Medical Oncology

## 2011-10-27 MED ORDER — LETROZOLE 2.5 MG PO TABS: 2.5000 mg | ORAL_TABLET | Freq: Every day | ORAL | Status: AC

## 2011-12-09 ENCOUNTER — Ambulatory Visit (HOSPITAL_BASED_OUTPATIENT_CLINIC_OR_DEPARTMENT_OTHER): Payer: Medicare Other | Admitting: Oncology

## 2011-12-09 ENCOUNTER — Ambulatory Visit (HOSPITAL_BASED_OUTPATIENT_CLINIC_OR_DEPARTMENT_OTHER): Payer: Medicare Other | Admitting: Lab

## 2011-12-09 ENCOUNTER — Other Ambulatory Visit: Payer: Medicare Other | Admitting: Lab

## 2011-12-09 ENCOUNTER — Telehealth: Payer: Self-pay | Admitting: Oncology

## 2011-12-09 ENCOUNTER — Encounter: Payer: Self-pay | Admitting: Oncology

## 2011-12-09 VITALS — BP 146/66 | HR 59 | Temp 97.5°F | Ht 65.0 in | Wt 103.5 lb

## 2011-12-09 DIAGNOSIS — E559 Vitamin D deficiency, unspecified: Secondary | ICD-10-CM

## 2011-12-09 DIAGNOSIS — Z170 Estrogen receptor positive status [ER+]: Secondary | ICD-10-CM | POA: Diagnosis not present

## 2011-12-09 DIAGNOSIS — C50419 Malignant neoplasm of upper-outer quadrant of unspecified female breast: Secondary | ICD-10-CM | POA: Diagnosis not present

## 2011-12-09 DIAGNOSIS — C50919 Malignant neoplasm of unspecified site of unspecified female breast: Secondary | ICD-10-CM | POA: Diagnosis not present

## 2011-12-09 LAB — CBC WITH DIFFERENTIAL/PLATELET
EOS%: 5.2 % (ref 0.0–7.0)
MCH: 30.2 pg (ref 25.1–34.0)
MCV: 91.2 fL (ref 79.5–101.0)
MONO%: 8.3 % (ref 0.0–14.0)
RBC: 4.71 10*6/uL (ref 3.70–5.45)
RDW: 13.6 % (ref 11.2–14.5)

## 2011-12-09 LAB — COMPREHENSIVE METABOLIC PANEL
AST: 43 U/L — ABNORMAL HIGH (ref 0–37)
Albumin: 3.7 g/dL (ref 3.5–5.2)
Alkaline Phosphatase: 89 U/L (ref 39–117)
Potassium: 3.8 mEq/L (ref 3.5–5.3)
Sodium: 138 mEq/L (ref 135–145)
Total Protein: 7.1 g/dL (ref 6.0–8.3)

## 2011-12-09 NOTE — Patient Instructions (Addendum)
1. Continue the femara 2.5 mg daily.  2. I will see you back in 6 months.  3. Please call with any problems

## 2011-12-09 NOTE — Progress Notes (Signed)
Put cancer policy papers on nurse's desk.

## 2011-12-09 NOTE — Progress Notes (Signed)
OFFICE PROGRESS NOTE  CC  SCOTT,CHARLENE S, MD [address] [place] [address] Dr. Claud Kelp  DIAGNOSIS: 78 year old female with stage II invasive ductal carcinoma of the left breast measuring 2.2 cm ER +100% PR negative HER-2/neu negative with a proliferation marker 13% status post left mastectomy.  PRIOR THERAPY:  #1 patient underwent a left mastectomy for a final path that revealed a 2.2 cm ER positive PR negative HER-2/neu negative invasive ductal carcinoma. Her mastectomy was performed on 08/12/2011.  #2 patient has gone on to receive antiestrogen therapy with letrozole 2.5 mg daily. Thus far she is tolerating it well.  CURRENT THERAPY:Letrozole 2.5 mg daily since 09/01/2011.  INTERVAL HISTORY: Cassidy Martinez 78 y.o. female returns for Followup visit. Overall she is doing well she is really without any significant complaints she denies any fevers chills night sweats headaches no shortness of breath she does have some tenderness at the surgical site. She denies any myalgias or arthralgias. She has not noticed any masses remainder of the 10 point review of systems is negative.  MEDICAL HISTORY: Past Medical History  Diagnosis Date  . Hyperlipidemia   . PONV (postoperative nausea and vomiting)   . Arthritis     hands  . Heart murmur   . History of UTI     "have had a few over the years; haven't had once since ~ 2007"  . Carpal tunnel syndrome, bilateral   . Migraines     "used to have them severe; stopped when I took self off estrogen"  . Breast CA     left;; S/P mastectomy 08/12/11    ALLERGIES:  is allergic to sulfa antibiotics.  MEDICATIONS:  Current Outpatient Prescriptions  Medication Sig Dispense Refill  . Ascorbic Acid (VITAMIN C PO) Take 600 mg by mouth daily.      Marland Kitchen CALCIUM PO Take 1,000 mg by mouth daily.      . carboxymethylcellulose 1 % ophthalmic solution Apply 1 drop to eye 2 (two) times daily.      . Cholecalciferol (VITAMIN D-3  PO) Take 1 capsule by mouth 2 (two) times daily.       Marland Kitchen FOLIC ACID PO Take 1 tablet by mouth daily.      . NON FORMULARY Take 1 capsule by mouth 4 (four) times daily. Vision Essential      . Red Yeast Rice Extract (RED YEAST RICE PO) Take 1 capsule by mouth 2 (two) times daily.        SURGICAL HISTORY:  Past Surgical History  Procedure Date  . Nasal sinus surgery   . Carpal tunnel release     right; "I've had it operated on twice"  . Scalp mass removal 2007    "knot; benign"  . Mastectomy complete / simple w/ sentinel node biopsy 08/12/11    axillary SNB; left  . Appendectomy   . Breast lumpectomy 03/2009    "abnormal cells"; right  . Abdominal hysterectomy ~ 1983  . Eye surgery 2006    repair of tear in retina  . Mastectomy w/ sentinel node biopsy 08/12/2011    Procedure: MASTECTOMY WITH SENTINEL LYMPH NODE BIOPSY;  Surgeon: Ernestene Mention, MD;  Location: [facility] OR;  Service: General;  Laterality: Left;    REVIEW OF SYSTEMS:  Pertinent items are noted in HPI.   PHYSICAL EXAMINATION: General appearance: alert, cooperative and appears stated age Lymph nodes: Cervical, supraclavicular, and axillary nodes normal. Resp: clear to auscultation bilaterally and normal percussion bilaterally Back:  symmetric, no curvature. ROM normal. No CVA tenderness. Cardio: regular rate and rhythm, S1, S2 normal, no murmur, click, rub or gallop GI: soft, non-tender; bowel sounds normal; no masses,  no organomegaly Extremities: extremities normal, atraumatic, no cyanosis or edema Neurologic: Grossly normal  ECOG PERFORMANCE STATUS: 1 - Symptomatic but completely ambulatory  Blood pressure 146/66, pulse 59, temperature 97.5 F (36.4 C), temperature source Oral, height 5\' 5"  (1.651 m), weight 103 lb 8 oz (46.947 kg).  LABORATORY DATA: Lab Results  Component Value Date   WBC 8.8 09/01/2011   HGB 13.8 09/01/2011   HCT 41.5 09/01/2011   MCV 91.4 09/01/2011   PLT 254 09/01/2011      Chemistry        Component Value Date/Time   NA 141 09/01/2011 1249   K 3.9 09/01/2011 1249   CL 103 09/01/2011 1249   CO2 29 09/01/2011 1249   BUN 21 09/01/2011 1249   CREATININE 0.82 09/01/2011 1249      Component Value Date/Time   CALCIUM 10.1 09/01/2011 1249   ALKPHOS 82 09/01/2011 1249   AST 37 09/01/2011 1249   ALT 16 09/01/2011 1249   BILITOT 0.5 09/01/2011 1249       RADIOGRAPHIC STUDIES:  No results found.  ASSESSMENT: 78 year old female with stage II invasive ductal carcinoma of the left breast measured 2.2 cm ER ER positive PR negative HER-2/neu negative with a Ki-67 13%. She is status post mastectomy on 08/12/2011. She was then begun on letrozole 2.5 mg daily adjuvantly. Overall she's tolerating it well without any problems. Patient is still unsure about reconstruction.   PLAN: I will continue to see the patient every 6 months. She however knows to call with any problems.   All questions were answered. The patient knows to call the clinic with any problems, questions or concerns. We can certainly see the patient much sooner if necessary.  I spent 25 minutes counseling the patient face to face. The total time spent in the appointment was 30 minutes.    Drue Second, MD Medical/Oncology [facility] [phone] (beeper) [phone] (Office)  12/09/2011, 2:43 PM

## 2011-12-09 NOTE — Telephone Encounter (Signed)
gve the pt her jan 2014 appt calendar °

## 2011-12-14 ENCOUNTER — Encounter: Payer: Self-pay | Admitting: Oncology

## 2011-12-14 NOTE — Progress Notes (Signed)
Faxed [employer] forms to [phone].

## 2011-12-20 ENCOUNTER — Encounter (INDEPENDENT_AMBULATORY_CARE_PROVIDER_SITE_OTHER): Payer: Self-pay | Admitting: General Surgery

## 2011-12-27 DIAGNOSIS — M810 Age-related osteoporosis without current pathological fracture: Secondary | ICD-10-CM | POA: Diagnosis not present

## 2011-12-27 DIAGNOSIS — E7800 Pure hypercholesterolemia, unspecified: Secondary | ICD-10-CM | POA: Diagnosis not present

## 2011-12-27 DIAGNOSIS — R945 Abnormal results of liver function studies: Secondary | ICD-10-CM | POA: Diagnosis not present

## 2011-12-29 DIAGNOSIS — R945 Abnormal results of liver function studies: Secondary | ICD-10-CM | POA: Diagnosis not present

## 2011-12-29 DIAGNOSIS — E7800 Pure hypercholesterolemia, unspecified: Secondary | ICD-10-CM | POA: Diagnosis not present

## 2011-12-29 DIAGNOSIS — C50919 Malignant neoplasm of unspecified site of unspecified female breast: Secondary | ICD-10-CM | POA: Diagnosis not present

## 2012-02-21 ENCOUNTER — Encounter (INDEPENDENT_AMBULATORY_CARE_PROVIDER_SITE_OTHER): Payer: Self-pay | Admitting: General Surgery

## 2012-02-21 ENCOUNTER — Ambulatory Visit (INDEPENDENT_AMBULATORY_CARE_PROVIDER_SITE_OTHER): Payer: Medicare Other | Admitting: General Surgery

## 2012-02-21 VITALS — BP 114/72 | HR 52 | Temp 98.2°F | Resp 12 | Ht 65.0 in | Wt 101.8 lb

## 2012-02-21 DIAGNOSIS — C50419 Malignant neoplasm of upper-outer quadrant of unspecified female breast: Secondary | ICD-10-CM

## 2012-02-21 NOTE — Progress Notes (Signed)
Patient ID: Cassidy Martinez, female   DOB: [birth year], 78 y.o.   MRN: [id number]  Chief Complaint  Patient presents with  . Annual Exam    6 month follow up    HPI Cassidy Martinez is a 78 y.o. female.  She returns for followup regarding her left breast cancer.  On 08/12/2011 she underwent left total mastectomy and sentinel node biopsy. 2 sentinel nodes were negative for a pathologic stage T2, N0. Tumor was 2.2 cm diameter, PR 100%, PR-negative, HER-2-negative, Ki- 6713%.  She is on adjuvant letrozole and followed frequently Dr. Drue Second.  She has a past history of right partial mastectomy by me for what turned out to be atypical ductal hyperplasia.  She has no complaints. She has full range of motion of her shoulders. She is fully active. She has no pain or complaints. She is due for a mammogram in January. HPI  Past Medical History  Diagnosis Date  . Hyperlipidemia   . PONV (postoperative nausea and vomiting)   . Arthritis     hands  . Heart murmur   . History of UTI     "have had a few over the years; haven't had once since ~ 2007"  . Carpal tunnel syndrome, bilateral   . Migraines     "used to have them severe; stopped when I took self off estrogen"  . Breast CA     left;; S/P mastectomy 08/12/11    Past Surgical History  Procedure Date  . Nasal sinus surgery   . Carpal tunnel release     right; "I've had it operated on twice"  . Scalp mass removal 2007    "knot; benign"  . Mastectomy complete / simple w/ sentinel node biopsy 08/12/11    axillary SNB; left  . Appendectomy   . Breast lumpectomy 03/2009    "abnormal cells"; right  . Abdominal hysterectomy ~ 1983  . Eye surgery 2006    repair of tear in retina  . Mastectomy w/ sentinel node biopsy 08/12/2011    Procedure: MASTECTOMY WITH SENTINEL LYMPH NODE BIOPSY;  Surgeon: Ernestene Mention, MD;  Location: [facility] OR;  Service: General;  Laterality: Left;    Family History  Problem Relation Age of Onset  . Stroke Mother     . Cancer Mother     ovarian  . Arthritis Father   . Cancer Maternal Aunt     breast  . Cancer Paternal Aunt     breast  . Anesthesia problems Neg Hx     Social History History  Substance Use Topics  . Smoking status: Never Smoker   . Smokeless tobacco: Never Used  . Alcohol Use: No    Allergies  Allergen Reactions  . Sulfa Antibiotics Itching    Starts at feet and rises up the body.    Current Outpatient Prescriptions  Medication Sig Dispense Refill  . Ascorbic Acid (VITAMIN C PO) Take 600 mg by mouth daily.      Marland Kitchen CALCIUM PO Take 1,000 mg by mouth daily.      . carboxymethylcellulose 1 % ophthalmic solution Apply 1 drop to eye 2 (two) times daily.      . Cholecalciferol (VITAMIN D-3 PO) Take 1 capsule by mouth 2 (two) times daily.       Marland Kitchen FOLIC ACID PO Take 1 tablet by mouth daily.      Marland Kitchen letrozole (FEMARA) 2.5 MG tablet Take 2.5 mg by mouth daily.      Marland Kitchen  NON FORMULARY Take 1 capsule by mouth 4 (four) times daily. Vision Essential      . Red Yeast Rice Extract (RED YEAST RICE PO) Take 1 capsule by mouth 2 (two) times daily.        Review of Systems Review of Systems  Constitutional: Negative for fever, chills and unexpected weight change.  HENT: Negative for hearing loss, congestion, sore throat, trouble swallowing and voice change.   Eyes: Negative for visual disturbance.  Respiratory: Negative for cough and wheezing.   Cardiovascular: Negative for chest pain, palpitations and leg swelling.  Gastrointestinal: Negative for nausea, vomiting, abdominal pain, diarrhea, constipation, blood in stool, abdominal distention and anal bleeding.  Genitourinary: Negative for hematuria, vaginal bleeding and difficulty urinating.  Musculoskeletal: Negative for arthralgias.  Skin: Negative for rash and wound.  Neurological: Negative for seizures, syncope and headaches.  Hematological: Negative for adenopathy. Does not bruise/bleed easily.  Psychiatric/Behavioral: Negative for  confusion.    Blood pressure 114/72, pulse 52, temperature 98.2 F (36.8 C), resp. rate 12, height 5\' 5"  (1.651 m), weight 101 lb 12.8 oz (46.176 kg).  Physical Exam Physical Exam  Constitutional: She is oriented to person, place, and time. She appears well-developed and well-nourished. No distress.  HENT:  Head: Normocephalic and atraumatic.  Eyes: Conjunctivae and EOM are normal. Pupils are equal, round, and reactive to light. Left eye exhibits no discharge. No scleral icterus.  Neck: Neck supple. No JVD present. No tracheal deviation present. No thyromegaly present.  Cardiovascular: Normal rate, regular rhythm, normal heart sounds and intact distal pulses.   No murmur heard. Pulmonary/Chest: Effort normal and breath sounds normal. No respiratory distress. She has no wheezes. She has no rales. She exhibits no tenderness.       Left mastectomy scar is well healed. Skin healthy. No nodules or ulceration. Full range of motion left shoulder. No arm edema. Right breast is small. Well-healed vertical incision at 6:00. No mass. No axillary adenopathy.  Abdominal: Soft.  Musculoskeletal: She exhibits no edema and no tenderness.  Lymphadenopathy:    She has no cervical adenopathy.  Neurological: She is alert and oriented to person, place, and time. She exhibits normal muscle tone. Coordination normal.  Skin: Skin is warm. No rash noted. She is not diaphoretic. No erythema. No pallor.  Psychiatric: She has a normal mood and affect. Her behavior is normal. Judgment and thought content normal.    Data Reviewed My old chart records and cancer Center notes.  Assessment    Invasive carcinoma left breast, ER 100%, HER-2-negative, Ki 67 13%, pathologic stage TII, N0.  Doing well 6 months following left total mastectomy and sentinel node biopsy.  Past history right partial mastectomy for atypical ductal hyperplasia.    Plan    Continue letrozole and routine followup with Dr. Drue Second.  Repeat right breast mammogram January 2014  Return to see me in February 2014.       Angelia Mould. Derrell Lolling, M.D., [facility] General and Minimally invasive Surgery Breast and Colorectal Surgery Office:   [phone] Pager:   [phone]  02/21/2012, 11:48 AM

## 2012-02-21 NOTE — Patient Instructions (Signed)
Your physical exam today is normal. All of your wounds have healed normally. There are no apparent complications of your left breast surgery.  Be sure to get sure right breast mammogram in January.  Keep your regular appointment with Dr. Drue Second.  Return to see Dr. Derrell Lolling in February, 2014.

## 2012-07-04 ENCOUNTER — Encounter: Payer: Self-pay | Admitting: *Deleted

## 2012-07-05 ENCOUNTER — Telehealth: Payer: Self-pay | Admitting: Oncology

## 2012-07-05 ENCOUNTER — Ambulatory Visit (HOSPITAL_BASED_OUTPATIENT_CLINIC_OR_DEPARTMENT_OTHER): Payer: Medicare Other | Admitting: Physician Assistant

## 2012-07-05 ENCOUNTER — Other Ambulatory Visit (HOSPITAL_BASED_OUTPATIENT_CLINIC_OR_DEPARTMENT_OTHER): Payer: Medicare Other | Admitting: Lab

## 2012-07-05 ENCOUNTER — Encounter: Payer: Self-pay | Admitting: Physician Assistant

## 2012-07-05 VITALS — BP 152/74 | HR 57 | Temp 97.4°F | Resp 20 | Ht 65.0 in | Wt 103.4 lb

## 2012-07-05 DIAGNOSIS — C50919 Malignant neoplasm of unspecified site of unspecified female breast: Secondary | ICD-10-CM | POA: Diagnosis not present

## 2012-07-05 DIAGNOSIS — E559 Vitamin D deficiency, unspecified: Secondary | ICD-10-CM

## 2012-07-05 DIAGNOSIS — Z170 Estrogen receptor positive status [ER+]: Secondary | ICD-10-CM | POA: Diagnosis not present

## 2012-07-05 DIAGNOSIS — C50419 Malignant neoplasm of upper-outer quadrant of unspecified female breast: Secondary | ICD-10-CM

## 2012-07-05 LAB — CBC WITH DIFFERENTIAL/PLATELET
Basophils Absolute: 0.1 10*3/uL (ref 0.0–0.1)
EOS%: 4.9 % (ref 0.0–7.0)
HGB: 13.7 g/dL (ref 11.6–15.9)
LYMPH%: 19.6 % (ref 14.0–49.7)
MCH: 30.4 pg (ref 25.1–34.0)
MCV: 89.6 fL (ref 79.5–101.0)
MONO%: 8.6 % (ref 0.0–14.0)
RBC: 4.49 10*6/uL (ref 3.70–5.45)
RDW: 13.1 % (ref 11.2–14.5)

## 2012-07-05 LAB — COMPREHENSIVE METABOLIC PANEL (CC13)
Alkaline Phosphatase: 73 U/L (ref 40–150)
BUN: 20 mg/dL (ref 7.0–26.0)
Creatinine: 0.9 mg/dL (ref 0.6–1.1)
Glucose: 86 mg/dl (ref 70–99)
Total Bilirubin: 0.91 mg/dL (ref 0.20–1.20)

## 2012-07-05 MED ORDER — LETROZOLE 2.5 MG PO TABS: 2.5000 mg | ORAL_TABLET | Freq: Every day | ORAL | Status: DC

## 2012-07-05 NOTE — Telephone Encounter (Signed)
gv pt appt schedule for July.  

## 2012-07-05 NOTE — Progress Notes (Signed)
[facility]  Telephone:([phone] [phone]   OFFICE PROGRESS NOTE   Cc:  Charm Barges, MD [address]  [place] [address]  Dr. Claud Kelp   DIAGNOSIS: 79 year old female with stage II invasive ductal carcinoma of the left breast measuring 2.2 cm ER +100% PR negative HER-2/neu negative with a proliferation marker 13% status post left mastectomy.  Patient has a prior history of partial R mastectomy for atypical ductal hyperplasia 03/2009  PRIOR THERAPY:   #1 patient underwent a left mastectomy for a final path that revealed a 2.2 cm ER positive PR negative HER-2/neu negative invasive ductal carcinoma.  Her mastectomy was performed on 08/12/2011.   #2 patient has gone on to receive antiestrogen therapy with letrozole 2.5 mg daily. Thus far she is tolerating it well.   CURRENT THERAPY:Letrozole 2.5 mg daily since 09/01/2011.    INTERVAL HISTORY:  Cassidy Martinez 79 y.o. female returns for a follow up appointment. She is tolerating  Letrozole well. Denies any  bone pain,myalgia,hot flashes,vaginal discharge or increased fatigue. No nausea, headache, or xerostomia. Activity level is adequate for her age. Last mammogram was performed on 07/12/11, negative. She is due for another one this year. She continues to take her Calcium and Vit D3 on a daily basis.Main complaint relates to the presence of a superficial pruritic rash in all extremities, first noticed about 2 months ago. No open lesions were seen by her. She has an appointment on 07/06/2012 with her PCP, at which time she is to address these issues. Rest of the 10 point ROS is negative   Past Medical History  Diagnosis Date  . Hyperlipidemia   . PONV (postoperative nausea and vomiting)   . Arthritis     hands  . Heart murmur   . History of UTI     "have had a few over the years; haven't had once since ~ 2007"  . Carpal tunnel syndrome, bilateral   . Migraines     "used to  have them severe; stopped when I took self off estrogen"  . Breast CA     left;; S/P mastectomy 08/12/11    Past Surgical History  Procedure Date  . Nasal sinus surgery   . Carpal tunnel release     right; "I've had it operated on twice"  . Scalp mass removal 2007    "knot; benign"  . Mastectomy complete / simple w/ sentinel node biopsy 08/12/11    axillary SNB; left  . Appendectomy   . Breast lumpectomy 03/2009    "abnormal cells"; right  . Abdominal hysterectomy ~ 1983  . Eye surgery 2006    repair of tear in retina  . Mastectomy w/ sentinel node biopsy 08/12/2011    Procedure: MASTECTOMY WITH SENTINEL LYMPH NODE BIOPSY;  Surgeon: Ernestene Mention, MD;  Location: [facility] OR;  Service: General;  Laterality: Left;    Current Outpatient Prescriptions  Medication Sig Dispense Refill  . Ascorbic Acid (VITAMIN C PO) Take 600 mg by mouth daily.      Marland Kitchen CALCIUM PO Take 1,000 mg by mouth daily.      . carboxymethylcellulose 1 % ophthalmic solution Apply 1 drop to eye 2 (two) times daily.      . Cholecalciferol (VITAMIN D-3 PO) Take 1 capsule by mouth 2 (two) times daily.       Marland Kitchen FOLIC ACID PO Take 1 tablet by mouth daily.      Marland Kitchen letrozole (FEMARA) 2.5 MG  tablet Take 2.5 mg by mouth daily.      . NON FORMULARY Take 1 capsule by mouth 4 (four) times daily. Vision Essential      . Red Yeast Rice Extract (RED YEAST RICE PO) Take 1 capsule by mouth 2 (two) times daily.        ALLERGIES:  is allergic to sulfa antibiotics.  REVIEW OF SYSTEMS:  The rest of the 10 -point review of system was negative.   Filed Vitals:   07/05/12 1000  BP: 152/74  Pulse: 57  Temp: 97.4 F (36.3 C)  Resp: 20   Wt Readings from Last 3 Encounters:  07/05/12 103 lb 6.4 oz (46.902 kg)  02/21/12 101 lb 12.8 oz (46.176 kg)  12/09/11 103 lb 8 oz (46.947 kg)     PHYSICAL EXAMINATION:  BP 152/74  Pulse 57  Temp 97.4 F (36.3 C) (Oral)  Resp 20  Ht 5\' 5"  (1.651 m)  Wt 103 lb 6.4 oz (46.902 kg)  BMI 17.21  kg/m2 GENERAL: Well developed,thin , in no acute distress.  EENT: No ocular or oral lesions. No stomatitis.  RESPIRATORY: Lungs are clear to auscultation bilaterally CARDIAC: No murmur, rub or tachycardia. No upper or lower extremity edema.  GI: Abdomen is soft, no palpable hepatosplenomegaly. No fluid wave. No tenderness. Musculoskeletal: No kyphosis, no tenderness over the spine, ribs or hips. Lymph: No cervical, infraclavicular, axillary or inguinal adenopathy. Neuro: No focal neurological deficits. Psych: Alert and oriented X 3, appropriate mood and affect.  BREAST EXAM: L breast absent post mastectomy, well healed scars with no new masses palpable or axillary masses.. R breast with a well healed partial mastectomy scar, no new masses palpable. No chest wall tenderness.    LABORATORY/RADIOLOGY DATA:  Lab Results  Component Value Date   WBC 8.7 07/05/2012   HGB 13.7 07/05/2012   HCT 40.2 07/05/2012   PLT 174 07/05/2012   GLUCOSE 86 07/05/2012   ALKPHOS 73 07/05/2012   ALT 19 07/05/2012   AST 41* 07/05/2012   NA 141 07/05/2012   K 4.2 07/05/2012   CL 103 07/05/2012   CREATININE 0.9 07/05/2012   BUN 20.0 07/05/2012   CO2 28 07/05/2012    No results found.     ASSESSMENT  1. Cassidy Martinez 79 y.o. female with   1.stage II invasive ductal carcinoma of the left breast measured 2.2 cm ER positive PR negative HER-2/neu negative with a Ki-67 13%. She is status post mastectomy on 08/12/2011. She was then begun on letrozole 2.5 mg daily adjuvantly. Overall she is  tolerating it well without any problems. Patient is still unsure about reconstruction.  2. Patient has a prior history of partial R mastectomy for atypical ductal hyperplasia 03/2009  PLAN:   1.Will continue to see the patient every 6 months. Femara 2.5 mg daily has been reordered with 12 refills.  2. Patient is to address with her PCP her skin rash. Recommend that the patient is referred to a dermatologist for further workup.   3. Patient is due for yearly mammogram, to be arranged by PCP 4.  Last bone density scan was performed on 09/15/11.  Continue to take Calcium and Vit D3  She  knows to call with any problems or concerns.

## 2012-07-05 NOTE — Patient Instructions (Addendum)
Continue taking the letrozole 2.5 mg daily  See Dr. Lorin Picket for referral to dermatology  We will see you back in 6 months

## 2012-07-06 ENCOUNTER — Ambulatory Visit (INDEPENDENT_AMBULATORY_CARE_PROVIDER_SITE_OTHER)
Admission: RE | Admit: 2012-07-06 | Discharge: 2012-07-06 | Disposition: A | Discharge status: Home / Self Care | Payer: Medicare Other | Source: Ambulatory Visit | Attending: Internal Medicine | Admitting: Internal Medicine

## 2012-07-06 ENCOUNTER — Ambulatory Visit (INDEPENDENT_AMBULATORY_CARE_PROVIDER_SITE_OTHER): Payer: Medicare Other | Admitting: Internal Medicine

## 2012-07-06 ENCOUNTER — Other Ambulatory Visit (HOSPITAL_COMMUNITY)
Admission: RE | Admit: 2012-07-06 | Discharge: 2012-07-06 | Disposition: A | Discharge status: Home / Self Care | Payer: Medicare Other | Source: Ambulatory Visit | Attending: Internal Medicine | Admitting: Internal Medicine

## 2012-07-06 ENCOUNTER — Encounter: Payer: Self-pay | Admitting: Internal Medicine

## 2012-07-06 VITALS — BP 130/68 | HR 62 | Ht 64.0 in | Wt 103.5 lb

## 2012-07-06 DIAGNOSIS — M25539 Pain in unspecified wrist: Secondary | ICD-10-CM

## 2012-07-06 DIAGNOSIS — M79609 Pain in unspecified limb: Secondary | ICD-10-CM | POA: Diagnosis not present

## 2012-07-06 DIAGNOSIS — Z139 Encounter for screening, unspecified: Secondary | ICD-10-CM | POA: Diagnosis not present

## 2012-07-06 DIAGNOSIS — M79646 Pain in unspecified finger(s): Secondary | ICD-10-CM

## 2012-07-06 DIAGNOSIS — M810 Age-related osteoporosis without current pathological fracture: Secondary | ICD-10-CM

## 2012-07-06 DIAGNOSIS — M79643 Pain in unspecified hand: Secondary | ICD-10-CM

## 2012-07-06 DIAGNOSIS — M19049 Primary osteoarthritis, unspecified hand: Secondary | ICD-10-CM | POA: Diagnosis not present

## 2012-07-06 DIAGNOSIS — S59909A Unspecified injury of unspecified elbow, initial encounter: Secondary | ICD-10-CM | POA: Diagnosis not present

## 2012-07-06 DIAGNOSIS — Z124 Encounter for screening for malignant neoplasm of cervix: Secondary | ICD-10-CM | POA: Insufficient documentation

## 2012-07-06 DIAGNOSIS — Z0000 Encounter for general adult medical examination without abnormal findings: Secondary | ICD-10-CM

## 2012-07-06 DIAGNOSIS — K769 Liver disease, unspecified: Secondary | ICD-10-CM

## 2012-07-06 DIAGNOSIS — Z1151 Encounter for screening for human papillomavirus (HPV): Secondary | ICD-10-CM | POA: Insufficient documentation

## 2012-07-06 DIAGNOSIS — R945 Abnormal results of liver function studies: Secondary | ICD-10-CM

## 2012-07-06 DIAGNOSIS — E7800 Pure hypercholesterolemia, unspecified: Secondary | ICD-10-CM | POA: Diagnosis not present

## 2012-07-06 DIAGNOSIS — S6980XA Other specified injuries of unspecified wrist, hand and finger(s), initial encounter: Secondary | ICD-10-CM | POA: Diagnosis not present

## 2012-07-06 DIAGNOSIS — S6990XA Unspecified injury of unspecified wrist, hand and finger(s), initial encounter: Secondary | ICD-10-CM | POA: Diagnosis not present

## 2012-07-06 DIAGNOSIS — C50419 Malignant neoplasm of upper-outer quadrant of unspecified female breast: Secondary | ICD-10-CM

## 2012-07-06 LAB — LIPID PANEL
HDL: 90.9 mg/dL (ref >39.00)
Triglycerides: 95 mg/dL (ref 0.0–149.0)
VLDL: 19 mg/dL (ref 0.0–40.0)

## 2012-07-06 LAB — LDL CHOLESTEROL, DIRECT: Direct LDL: 144.7 mg/dL

## 2012-07-06 MED ORDER — TRIAMCINOLONE ACETONIDE 0.1 % EX CREA: TOPICAL_CREAM | Freq: Two times a day (BID) | CUTANEOUS | Status: DC

## 2012-07-06 NOTE — Patient Instructions (Signed)
I want you to get zyrtec - over the counter.  Take one per day.  I am going to prescribe triamcinolone cream  Apply twice a day to affect area for the next 7-10 days.  Let me know if you continue to have problems.

## 2012-07-08 ENCOUNTER — Encounter: Payer: Self-pay | Admitting: Internal Medicine

## 2012-07-08 DIAGNOSIS — M810 Age-related osteoporosis without current pathological fracture: Secondary | ICD-10-CM | POA: Insufficient documentation

## 2012-07-08 DIAGNOSIS — R945 Abnormal results of liver function studies: Secondary | ICD-10-CM | POA: Insufficient documentation

## 2012-07-08 DIAGNOSIS — E7800 Pure hypercholesterolemia, unspecified: Secondary | ICD-10-CM | POA: Insufficient documentation

## 2012-07-08 NOTE — Assessment & Plan Note (Signed)
Worked up and evaluated by Dr Markham Jordan.  He recommended to continue to follow liver panel  Recheck with next labs.

## 2012-07-08 NOTE — Progress Notes (Signed)
Subjective:    Patient ID: Cassidy Martinez, female    DOB: 14-Apr-1932, 79 y.o.   MRN: [id number]  HPI 79 year old female with past history of breast cancer followed by Dr Park Breed, reoccurring allergy and sinus problems and hypercholesterolemia who comes in today to follow up on these issues as well as for a complete physical exam.  She was just evaluated by oncology yesterday.  Doing well.  On femara and tolerating.  Up to date with mammograms.  Eating and drinking well.  No nausea or vomiting.  Bowels stable.  She has had a rash over the last month.  Located on her legs and forearms.  Also has a small patch on her back.  Has just been applying vaseline lotion.  No other areas involved.  No new exposures.  She did fall about a month ago.  Injured her wrist and finger.  Fourth finger bothering her the most.  Able to move it - some limited rom.    Past Medical History  Diagnosis Date  . Hyperlipidemia   . PONV (postoperative nausea and vomiting)   . Arthritis     hands  . Heart murmur   . History of UTI     "have had a few over the years; haven't had once since ~ 2007"  . Carpal tunnel syndrome, bilateral   . Migraines     "used to have them severe; stopped when I took self off estrogen"  . Breast CA     left;; S/P mastectomy 08/12/11    Current Outpatient Prescriptions on File Prior to Visit  Medication Sig Dispense Refill  . Ascorbic Acid (VITAMIN C PO) Take 600 mg by mouth daily.      Marland Kitchen CALCIUM PO Take 1,000 mg by mouth daily.      . carboxymethylcellulose 1 % ophthalmic solution Apply 1 drop to eye 2 (two) times daily.      . Cholecalciferol (VITAMIN D-3 PO) Take 1 capsule by mouth 2 (two) times daily.       Marland Kitchen FOLIC ACID PO Take 1 tablet by mouth daily.      Marland Kitchen letrozole (FEMARA) 2.5 MG tablet Take 1 tablet (2.5 mg total) by mouth daily.  90 tablet  12  . NON FORMULARY Take 1 capsule by mouth 4 (four) times daily. Vision Essential      . Red Yeast Rice Extract (RED YEAST RICE PO) Take 1  capsule by mouth 2 (two) times daily.        Review of Systems Patient denies any headache, lightheadedness or dizziness.  No significant sinus or allergy symptoms.  No chest pain, tightness or palpitations.  No increased shortness of breath, cough or congestion.  No nausea or vomiting.  No acid reflux.  No abdominal pain or cramping.  No bowel change, such as diarrhea, constipation, BRBPR or melana.  No urine change.  No vaginal problems.  Tolerating the femara.  Does have the rash as outlined.       Objective:   Physical Exam Filed Vitals:   07/06/12 0824  BP: 130/68  Pulse: 58   79 year old female in no acute distress.   HEENT:  Nares- clear.  Oropharynx - without lesions. NECK:  Supple.  Nontender.  No audible bruit.  HEART:  Appears to be regular. LUNGS:  No crackles or wheezing audible.  Respirations even and unlabored.  RADIAL PULSE:  Equal bilaterally.    BREASTS:  No nipple discharge or nipple retraction present.  Could not appreciate any distinct nodules or axillary adenopathy.  ABDOMEN:  Soft, nontender.  Bowel sounds present and normal.  No audible abdominal bruit.  GU:  Normal external genitalia.  Vaginal vault without lesions.  S/p hysterectomy.  Could not appreciate any adnexal masses or tenderness.   RECTAL:  Heme negative.   EXTREMITIES:  No increased edema present.  DP pulses palpable and equal bilaterally.  SKIN:  Erythematous based rash over the lower legs and forearms.  No trunk involvement. No facial involvement.  Small patch on her back.         Assessment & Plan:  CARDIOVASCULAR.  Asymptomatic.    RASH.  Rash noted on legs, forearms and a small patch on her back.  Appears to be consistent with a contact dermatitis.  No new exposures.  Will treat with triamcinolone cream .1% bid x 7-10 days.  Let me know if persistent.  Also otc antihistamine.    MSK.  Describes the hand, finger and wrist pain s/p fall.  Persistent discomfort.  Will xray.    HEALTH  MAINTENANCE.  Physical today.  Up to date with mammograms.  Colonoscopy 7/02 - internal hemorrhoids.  Talked with her regarding follow up colonoscopy.  She wants to hold on referral at this point.  Will notify me when agreeable.  IFOB - today.

## 2012-07-08 NOTE — Assessment & Plan Note (Signed)
She declines medication.  Continue calcium and vitamin D.  Continue weight bearing exercise.  Follow.      

## 2012-07-08 NOTE — Assessment & Plan Note (Signed)
On no medication.  Desires not to take.  Takes red yeast rice.  Check lipid panel.

## 2012-07-08 NOTE — Assessment & Plan Note (Signed)
Followed by Dr Park Breed.  Evaluated yesterday.  They are following mammograms.

## 2012-07-12 ENCOUNTER — Telehealth: Payer: Self-pay | Admitting: Internal Medicine

## 2012-07-12 NOTE — Telephone Encounter (Signed)
Returning your call from yesterday regarding her tests, a rash, and x-rays about her hand.  Please call.

## 2012-07-13 NOTE — Telephone Encounter (Signed)
Spoke to patient regarding her referral for joint specialist and lab results

## 2012-07-14 ENCOUNTER — Telehealth: Payer: Self-pay | Admitting: Internal Medicine

## 2012-07-14 DIAGNOSIS — M25539 Pain in unspecified wrist: Secondary | ICD-10-CM

## 2012-07-14 DIAGNOSIS — M79643 Pain in unspecified hand: Secondary | ICD-10-CM

## 2012-07-14 NOTE — Telephone Encounter (Signed)
Message copied by Charm Barges on Sat Jul 14, 2012 10:10 AM ------      Message from: Algis Downs      Created: Fri Jul 13, 2012 12:31 PM       Patient informed of x ray findings per Dr. Roby Lofts report. She is interested in a referral to see a joint specialist

## 2012-07-14 NOTE — Telephone Encounter (Signed)
Pt notified of xray results.  Desires referral.  Order placed for referral to Dr Gavin Potters

## 2012-07-16 ENCOUNTER — Telehealth (INDEPENDENT_AMBULATORY_CARE_PROVIDER_SITE_OTHER): Payer: Self-pay | Admitting: General Surgery

## 2012-07-16 ENCOUNTER — Other Ambulatory Visit (INDEPENDENT_AMBULATORY_CARE_PROVIDER_SITE_OTHER): Payer: Self-pay | Admitting: General Surgery

## 2012-07-16 DIAGNOSIS — C50919 Malignant neoplasm of unspecified site of unspecified female breast: Secondary | ICD-10-CM

## 2012-07-16 NOTE — Telephone Encounter (Signed)
Called patient and confirmed yearly mammogram had not been performed. Advised patient an order would be submitted for her and to call [facility] tomorrow to obtain a date and time for the appointment. I reminded the patient of appointment to see Dr.Ingrom on 07/24/12 and that the test would need to be done this week in order for the results to be available for review at office visit. I advised the patient that if she ran into any problems with appointment to please call me. The patient agreed.  I called [facility] and advised of patient due to call tomorrow and why.

## 2012-07-17 ENCOUNTER — Other Ambulatory Visit (INDEPENDENT_AMBULATORY_CARE_PROVIDER_SITE_OTHER): Payer: Self-pay | Admitting: General Surgery

## 2012-07-17 ENCOUNTER — Telehealth (INDEPENDENT_AMBULATORY_CARE_PROVIDER_SITE_OTHER): Payer: Self-pay | Admitting: General Surgery

## 2012-07-17 DIAGNOSIS — C50419 Malignant neoplasm of upper-outer quadrant of unspecified female breast: Secondary | ICD-10-CM

## 2012-07-17 NOTE — Telephone Encounter (Signed)
Called patient back and advised of mammogram set up for her to have performed this week prior to appointment w/Dr. Derrell Lolling next week. Patient given directly phone number to the breast center to find the time that works best for her schedule.  Spoke with Lynden Ang at the breast center prior to talking to patient and advised this patient will need to have the test most likely on Thursday due to her work schedule and there results by Tuesday when she comes to see Dr. Derrell Lolling.

## 2012-07-19 ENCOUNTER — Other Ambulatory Visit (INDEPENDENT_AMBULATORY_CARE_PROVIDER_SITE_OTHER): Payer: Medicare Other

## 2012-07-19 ENCOUNTER — Ambulatory Visit: Payer: Medicare Other

## 2012-07-19 ENCOUNTER — Ambulatory Visit
Admission: RE | Admit: 2012-07-19 | Discharge: 2012-07-19 | Disposition: A | Discharge status: Home / Self Care | Payer: Medicare Other | Source: Ambulatory Visit | Attending: General Surgery | Admitting: General Surgery

## 2012-07-19 DIAGNOSIS — Z0189 Encounter for other specified special examinations: Secondary | ICD-10-CM

## 2012-07-19 DIAGNOSIS — Z124 Encounter for screening for malignant neoplasm of cervix: Secondary | ICD-10-CM | POA: Diagnosis not present

## 2012-07-19 DIAGNOSIS — Z139 Encounter for screening, unspecified: Secondary | ICD-10-CM

## 2012-07-20 LAB — FECAL OCCULT BLOOD, IMMUNOCHEMICAL: Fecal Occult Bld: NEGATIVE

## 2012-07-24 ENCOUNTER — Encounter (INDEPENDENT_AMBULATORY_CARE_PROVIDER_SITE_OTHER): Payer: Self-pay | Admitting: General Surgery

## 2012-07-24 ENCOUNTER — Ambulatory Visit (INDEPENDENT_AMBULATORY_CARE_PROVIDER_SITE_OTHER): Payer: Medicare Other | Admitting: General Surgery

## 2012-07-24 VITALS — BP 104/68 | HR 70 | Temp 97.3°F | Resp 16 | Ht 65.0 in | Wt 102.5 lb

## 2012-07-24 DIAGNOSIS — C50419 Malignant neoplasm of upper-outer quadrant of unspecified female breast: Secondary | ICD-10-CM

## 2012-07-24 NOTE — Progress Notes (Signed)
Patient ID: Cassidy Martinez, female   DOB: January 22, 1932, 79 y.o.   MRN: [id number]  Chief Complaint  Patient presents with  . Breast Cancer Long Term Follow Up    HPI Cassidy Martinez is a 79 y.o. female.  She returns for long-term followup regarding her left breast cancer.  On 08/12/2011 this patient underwent left total mastectomy and sentinel node biopsy. She had a 2.2 cm invasive cancer. 0 of 2 sentinel nodes were positive for a pathologic stage T2, N0 tumor. ER 100%, PR-negative, HER-2-negative, Ki-67 13%. She is on letrozole and is followed by Dr. Welton Flakes.  She has no complaints about her right breast and she has no complaints about her left mastectomy wound. Recent right breast mammogram on 07/19/2012 is category 1, normal.  She has a remote history of a right partial mastectomy for atypical ductal hyperplasia.  HPI  Past Medical History  Diagnosis Date  . Hyperlipidemia   . PONV (postoperative nausea and vomiting)   . Arthritis     hands  . Heart murmur   . History of UTI     "have had a few over the years; haven't had once since ~ 2007"  . Carpal tunnel syndrome, bilateral   . Migraines     "used to have them severe; stopped when I took self off estrogen"  . Breast CA     left;; S/P mastectomy 08/12/11    Past Surgical History  Procedure Laterality Date  . Nasal sinus surgery    . Carpal tunnel release      right; "I've had it operated on twice"  . Scalp mass removal  2007    "knot; benign"  . Mastectomy complete / simple w/ sentinel node biopsy  08/12/11    axillary SNB; left  . Appendectomy    . Breast lumpectomy  03/2009    "abnormal cells"; right  . Abdominal hysterectomy  ~ 1983  . Eye surgery  2006    repair of tear in retina  . Mastectomy w/ sentinel node biopsy  08/12/2011    Procedure: MASTECTOMY WITH SENTINEL LYMPH NODE BIOPSY;  Surgeon: Ernestene Mention, MD;  Location: [facility] OR;  Service: General;  Laterality: Left;    Family History  Problem Relation Age of Onset   . Stroke Mother   . Cancer Mother     ovarian  . Arthritis Father   . Cancer Maternal Aunt     breast  . Cancer Paternal Aunt     breast  . Anesthesia problems Neg Hx     Social History History  Substance Use Topics  . Smoking status: Never Smoker   . Smokeless tobacco: Never Used  . Alcohol Use: No    Allergies  Allergen Reactions  . Sulfa Antibiotics Itching    Starts at feet and rises up the body.  . Macrobid (Nitrofurantoin Macrocrystal) Rash  . Penicillins Rash    Current Outpatient Prescriptions  Medication Sig Dispense Refill  . Ascorbic Acid (VITAMIN C PO) Take 600 mg by mouth daily.      Marland Kitchen CALCIUM PO Take 1,000 mg by mouth daily.      . carboxymethylcellulose 1 % ophthalmic solution Apply 1 drop to eye 2 (two) times daily.      . Cholecalciferol (VITAMIN D-3 PO) Take 1 capsule by mouth 2 (two) times daily.       Marland Kitchen FOLIC ACID PO Take 1 tablet by mouth daily.      Marland Kitchen letrozole [facility]) 2.5  MG tablet Take 1 tablet (2.5 mg total) by mouth daily.  90 tablet  12  . NON FORMULARY Take 1 capsule by mouth 4 (four) times daily. Vision Essential      . Red Yeast Rice Extract (RED YEAST RICE PO) Take 1 capsule by mouth 2 (two) times daily.      Marland Kitchen triamcinolone cream (KENALOG) 0.1 % Apply topically 2 (two) times daily.  30 g  0   No current facility-administered medications for this visit.    Review of Systems Review of Systems  Constitutional: Negative for fever, chills and unexpected weight change.  HENT: Negative for hearing loss, congestion, sore throat, trouble swallowing and voice change.   Eyes: Negative for visual disturbance.  Respiratory: Negative for cough and wheezing.   Cardiovascular: Negative for chest pain, palpitations and leg swelling.  Gastrointestinal: Negative for nausea, vomiting, abdominal pain, diarrhea, constipation, blood in stool, abdominal distention and anal bleeding.  Genitourinary: Negative for hematuria, vaginal bleeding and difficulty  urinating.  Musculoskeletal: Negative for arthralgias.  Skin: Negative for rash and wound.  Neurological: Negative for seizures, syncope and headaches.  Hematological: Negative for adenopathy. Does not bruise/bleed easily.  Psychiatric/Behavioral: Negative for confusion.    Blood pressure 104/68, pulse 70, temperature 97.3 F (36.3 C), temperature source Temporal, resp. rate 16, height 5\' 5"  (1.651 m), weight 102 lb 8 oz (46.494 kg).  Physical Exam Physical Exam  Constitutional: She is oriented to person, place, and time. She appears well-developed and well-nourished. No distress.  HENT:  Head: Normocephalic and atraumatic.  Eyes: Conjunctivae and EOM are normal. Pupils are equal, round, and reactive to light. Left eye exhibits no discharge. No scleral icterus.  Neck: Neck supple. No JVD present. No tracheal deviation present. No thyromegaly present.  Cardiovascular: Normal rate, regular rhythm, normal heart sounds and intact distal pulses.   No murmur heard. Pulmonary/Chest: Effort normal and breath sounds normal. No respiratory distress. She has no wheezes. She has no rales. She exhibits no tenderness.  Left mastectomy scar well healed. No nodules ulceration or axillary mass. Full range of motion left shoulder. Right breast small, a radial incision at 6:00. No mass or other skin changes. No axillary adenopathy.  Musculoskeletal: She exhibits no edema and no tenderness.  Lymphadenopathy:    She has no cervical adenopathy.  Neurological: She is alert and oriented to person, place, and time. She exhibits normal muscle tone. Coordination normal.  Skin: Skin is warm. No rash noted. She is not diaphoretic. No erythema. No pallor.  Psychiatric: She has a normal mood and affect. Her behavior is normal. Judgment and thought content normal.    Data Reviewed Imaging studies. Prior pathology. Cancer Center notes.  Assessment    Invasive cancer left breast, pathologic stage TII, N0, ER  positive, PR-negative, HER-2 negative.  No evidence of recurrence one year following left total mastectomy and sentinel node biopsy  Remote history right partial mastectomy for ADH     Plan    Continue letrozole and routine followup with Dr. Drue Second.  Return to see me in one year after annual right breast mammogram.        Angelia Mould. Derrell Lolling, M.D., [facility] General and Minimally invasive Surgery Breast and Colorectal Surgery Office:   [phone] Pager:   [phone]  07/24/2012, 9:41 AM

## 2012-07-24 NOTE — Patient Instructions (Signed)
Your physical exam today is normal, and your right breast mammogram that was done recently is normal. There is no evidence of cancer.  Continue to take the letrozole, and keep your regular appointment Dr. Drue Second.  Return to see Dr. Derrell Lolling in one year after you get your mammogram.

## 2012-09-04 ENCOUNTER — Telehealth: Payer: Self-pay | Admitting: Internal Medicine

## 2012-09-04 NOTE — Telephone Encounter (Signed)
Pt informing Dr. Lorin Picket that Dr. Gavin Potters has referred the pt to Dr. Orson Aloe (dermatology), [facility] for hand.  Appointments have already been made by Dr. Gavin Potters.  Pt wanting to be sure they will be covered since Dr. Gavin Potters referred.  Pt states she did call her insurance and they said it was fine.

## 2012-09-04 NOTE — Telephone Encounter (Signed)
Just an FYI

## 2012-09-04 NOTE — Telephone Encounter (Signed)
Noted. Keep us posted

## 2012-09-13 ENCOUNTER — Encounter: Payer: Self-pay | Admitting: Rheumatology

## 2012-09-13 DIAGNOSIS — M6281 Muscle weakness (generalized): Secondary | ICD-10-CM | POA: Diagnosis not present

## 2012-09-13 DIAGNOSIS — M25549 Pain in joints of unspecified hand: Secondary | ICD-10-CM | POA: Diagnosis not present

## 2012-09-13 DIAGNOSIS — IMO0001 Reserved for inherently not codable concepts without codable children: Secondary | ICD-10-CM | POA: Diagnosis not present

## 2012-09-13 DIAGNOSIS — R609 Edema, unspecified: Secondary | ICD-10-CM | POA: Diagnosis not present

## 2012-09-18 DIAGNOSIS — R609 Edema, unspecified: Secondary | ICD-10-CM | POA: Diagnosis not present

## 2012-09-18 DIAGNOSIS — M6281 Muscle weakness (generalized): Secondary | ICD-10-CM | POA: Diagnosis not present

## 2012-09-18 DIAGNOSIS — M25549 Pain in joints of unspecified hand: Secondary | ICD-10-CM | POA: Diagnosis not present

## 2012-09-18 DIAGNOSIS — IMO0001 Reserved for inherently not codable concepts without codable children: Secondary | ICD-10-CM | POA: Diagnosis not present

## 2012-09-20 DIAGNOSIS — M25549 Pain in joints of unspecified hand: Secondary | ICD-10-CM | POA: Diagnosis not present

## 2012-09-20 DIAGNOSIS — IMO0001 Reserved for inherently not codable concepts without codable children: Secondary | ICD-10-CM | POA: Diagnosis not present

## 2012-09-20 DIAGNOSIS — M6281 Muscle weakness (generalized): Secondary | ICD-10-CM | POA: Diagnosis not present

## 2012-09-20 DIAGNOSIS — R609 Edema, unspecified: Secondary | ICD-10-CM | POA: Diagnosis not present

## 2012-09-25 DIAGNOSIS — M25549 Pain in joints of unspecified hand: Secondary | ICD-10-CM | POA: Diagnosis not present

## 2012-09-25 DIAGNOSIS — L989 Disorder of the skin and subcutaneous tissue, unspecified: Secondary | ICD-10-CM | POA: Diagnosis not present

## 2012-09-25 DIAGNOSIS — M6281 Muscle weakness (generalized): Secondary | ICD-10-CM | POA: Diagnosis not present

## 2012-09-25 DIAGNOSIS — IMO0001 Reserved for inherently not codable concepts without codable children: Secondary | ICD-10-CM | POA: Diagnosis not present

## 2012-09-25 DIAGNOSIS — R609 Edema, unspecified: Secondary | ICD-10-CM | POA: Diagnosis not present

## 2012-09-27 DIAGNOSIS — IMO0001 Reserved for inherently not codable concepts without codable children: Secondary | ICD-10-CM | POA: Diagnosis not present

## 2012-09-27 DIAGNOSIS — R609 Edema, unspecified: Secondary | ICD-10-CM | POA: Diagnosis not present

## 2012-09-27 DIAGNOSIS — M25549 Pain in joints of unspecified hand: Secondary | ICD-10-CM | POA: Diagnosis not present

## 2012-09-27 DIAGNOSIS — M6281 Muscle weakness (generalized): Secondary | ICD-10-CM | POA: Diagnosis not present

## 2012-10-11 ENCOUNTER — Encounter: Payer: Self-pay | Admitting: Rheumatology

## 2012-10-11 DIAGNOSIS — R609 Edema, unspecified: Secondary | ICD-10-CM | POA: Diagnosis not present

## 2012-10-11 DIAGNOSIS — M6281 Muscle weakness (generalized): Secondary | ICD-10-CM | POA: Diagnosis not present

## 2012-10-11 DIAGNOSIS — M25549 Pain in joints of unspecified hand: Secondary | ICD-10-CM | POA: Diagnosis not present

## 2012-10-11 DIAGNOSIS — IMO0001 Reserved for inherently not codable concepts without codable children: Secondary | ICD-10-CM | POA: Diagnosis not present

## 2012-10-11 DIAGNOSIS — M25649 Stiffness of unspecified hand, not elsewhere classified: Secondary | ICD-10-CM | POA: Diagnosis not present

## 2012-10-16 DIAGNOSIS — L989 Disorder of the skin and subcutaneous tissue, unspecified: Secondary | ICD-10-CM | POA: Diagnosis not present

## 2012-11-11 ENCOUNTER — Encounter: Payer: Self-pay | Admitting: Rheumatology

## 2013-01-03 ENCOUNTER — Telehealth: Payer: Self-pay | Admitting: Oncology

## 2013-01-03 ENCOUNTER — Encounter: Payer: Self-pay | Admitting: Oncology

## 2013-01-03 ENCOUNTER — Other Ambulatory Visit (HOSPITAL_BASED_OUTPATIENT_CLINIC_OR_DEPARTMENT_OTHER): Payer: Medicare Other | Admitting: Lab

## 2013-01-03 ENCOUNTER — Ambulatory Visit (HOSPITAL_BASED_OUTPATIENT_CLINIC_OR_DEPARTMENT_OTHER): Payer: Medicare Other | Admitting: Oncology

## 2013-01-03 VITALS — BP 146/53 | HR 50 | Temp 97.4°F | Resp 20 | Ht 65.0 in | Wt 100.3 lb

## 2013-01-03 DIAGNOSIS — C50419 Malignant neoplasm of upper-outer quadrant of unspecified female breast: Secondary | ICD-10-CM | POA: Diagnosis not present

## 2013-01-03 DIAGNOSIS — E559 Vitamin D deficiency, unspecified: Secondary | ICD-10-CM

## 2013-01-03 DIAGNOSIS — C50919 Malignant neoplasm of unspecified site of unspecified female breast: Secondary | ICD-10-CM

## 2013-01-03 DIAGNOSIS — C50412 Malignant neoplasm of upper-outer quadrant of left female breast: Secondary | ICD-10-CM

## 2013-01-03 DIAGNOSIS — C50912 Malignant neoplasm of unspecified site of left female breast: Secondary | ICD-10-CM

## 2013-01-03 LAB — COMPREHENSIVE METABOLIC PANEL (CC13)
ALT: 21 U/L (ref 0–55)
BUN: 19.1 mg/dL (ref 7.0–26.0)
CO2: 28 mEq/L (ref 22–29)
Calcium: 10.1 mg/dL (ref 8.4–10.4)
Chloride: 105 mEq/L (ref 98–109)
Creatinine: 1 mg/dL (ref 0.6–1.1)
Glucose: 89 mg/dl (ref 70–140)
Total Bilirubin: 0.78 mg/dL (ref 0.20–1.20)

## 2013-01-03 LAB — CBC WITH DIFFERENTIAL/PLATELET
HGB: 13.9 g/dL (ref 11.6–15.9)
LYMPH%: 26.9 % (ref 14.0–49.7)
MCH: 30.3 pg (ref 25.1–34.0)
MONO#: 0.6 10*3/uL (ref 0.1–0.9)
MONO%: 8.9 % (ref 0.0–14.0)
NEUT%: 57.4 % (ref 38.4–76.8)
RDW: 13.4 % (ref 11.2–14.5)
WBC: 7.2 10*3/uL (ref 3.9–10.3)
lymph#: 1.9 10*3/uL (ref 0.9–3.3)

## 2013-01-03 LAB — VITAMIN D 25 HYDROXY (VIT D DEFICIENCY, FRACTURES): Vit D, 25-Hydroxy: 79 ng/mL (ref 30–89)

## 2013-01-03 NOTE — Patient Instructions (Addendum)
Doing well  We will see you back in 6 months 

## 2013-01-03 NOTE — Telephone Encounter (Signed)
gv pt appt schedule for January 2015. °

## 2013-01-08 ENCOUNTER — Encounter: Payer: Self-pay | Admitting: Internal Medicine

## 2013-01-08 ENCOUNTER — Ambulatory Visit (INDEPENDENT_AMBULATORY_CARE_PROVIDER_SITE_OTHER): Payer: Medicare Other | Admitting: Internal Medicine

## 2013-01-08 VITALS — BP 110/60 | HR 57 | Temp 97.9°F | Ht 64.0 in | Wt 100.2 lb

## 2013-01-08 DIAGNOSIS — K769 Liver disease, unspecified: Secondary | ICD-10-CM | POA: Diagnosis not present

## 2013-01-08 DIAGNOSIS — E7800 Pure hypercholesterolemia, unspecified: Secondary | ICD-10-CM | POA: Diagnosis not present

## 2013-01-08 DIAGNOSIS — C50412 Malignant neoplasm of upper-outer quadrant of left female breast: Secondary | ICD-10-CM

## 2013-01-08 DIAGNOSIS — R634 Abnormal weight loss: Secondary | ICD-10-CM

## 2013-01-08 DIAGNOSIS — R945 Abnormal results of liver function studies: Secondary | ICD-10-CM

## 2013-01-08 DIAGNOSIS — C50419 Malignant neoplasm of upper-outer quadrant of unspecified female breast: Secondary | ICD-10-CM | POA: Diagnosis not present

## 2013-01-08 DIAGNOSIS — M810 Age-related osteoporosis without current pathological fracture: Secondary | ICD-10-CM

## 2013-01-08 NOTE — Progress Notes (Signed)
Subjective:    Patient ID: Cassidy Martinez, female    DOB: [birth year], 79 y.o.   MRN: [id number]  HPI 79 year old female with past history of breast cancer followed by Dr Park Breed, reoccurring allergy and sinus problems and hypercholesterolemia who comes in today for a scheduled follow up.  Seeing oncology.  Doing well.  On femara and tolerating.  Up to date with mammograms.  Eating and drinking well.  No nausea or vomiting.  Bowels stable.  She has had a rash over the last month.  Located on her legs and forearms.     Past Medical History  Diagnosis Date  . Hyperlipidemia   . PONV (postoperative nausea and vomiting)   . Arthritis     hands  . Heart murmur   . History of UTI     "have had a few over the years; haven't had once since ~ 2007"  . Carpal tunnel syndrome, bilateral   . Migraines     "used to have them severe; stopped when I took self off estrogen"  . Breast CA     left;; S/P mastectomy 08/12/11    Current Outpatient Prescriptions on File Prior to Visit  Medication Sig Dispense Refill  . Ascorbic Acid (VITAMIN C PO) Take 600 mg by mouth daily.      Marland Kitchen CALCIUM PO Take 1,000 mg by mouth daily.      . carboxymethylcellulose 1 % ophthalmic solution Apply 1 drop to eye 2 (two) times daily.      . Cholecalciferol (VITAMIN D-3 PO) Take 1 capsule by mouth 2 (two) times daily.       Marland Kitchen FOLIC ACID PO Take 1 tablet by mouth daily.      Marland Kitchen letrozole (FEMARA) 2.5 MG tablet Take 1 tablet (2.5 mg total) by mouth daily.  90 tablet  12  . NON FORMULARY Take 1 capsule by mouth 4 (four) times daily. Vision Essential      . Red Yeast Rice Extract (RED YEAST RICE PO) Take 1 capsule by mouth 2 (two) times daily.      Marland Kitchen triamcinolone cream (KENALOG) 0.1 % Apply topically 2 (two) times daily.  30 g  0   No current facility-administered medications on file prior to visit.    Review of Systems Patient denies any headache, lightheadedness or dizziness.  No significant sinus or allergy symptoms.  No chest  pain, tightness or palpitations.  No increased shortness of breath, cough or congestion.  No nausea or vomiting.  No acid reflux.  No abdominal pain or cramping.  No bowel change, such as diarrhea, constipation, BRBPR or melana.  No urine change.  No vaginal problems.  Tolerating the femara.  Does have the rash as outlined.       Objective:   Physical Exam  Filed Vitals:   01/08/13 0903  BP: 110/60  Pulse: 57  Temp: 97.9 F (54.27 C)   79 year old female in no acute distress.   HEENT:  Nares- clear.  Oropharynx - without lesions. NECK:  Supple.  Nontender.  No audible bruit.  HEART:  Appears to be regular. LUNGS:  No crackles or wheezing audible.  Respirations even and unlabored.  RADIAL PULSE:  Equal bilaterally.   ABDOMEN:  Soft, nontender.  Bowel sounds present and normal.  No audible abdominal bruit.    EXTREMITIES:  No increased edema present.  DP pulses palpable and equal bilaterally.          Assessment &  Plan:  CARDIOVASCULAR.  Asymptomatic.  RIGHT BREAST RASH.  Improved.  Will notify me if persistent.      HEALTH MAINTENANCE.  Physical 07/06/12.Marland Kitchen  Up to date with mammograms.  Colonoscopy 7/02 - internal hemorrhoids.  Talked with her regarding follow up colonoscopy.  She wants to hold on referral at this point.  Will notify me when agreeable.  IFOB negative 07/19/12.

## 2013-01-12 ENCOUNTER — Encounter: Payer: Self-pay | Admitting: Internal Medicine

## 2013-01-12 NOTE — Assessment & Plan Note (Signed)
Followed by Dr Park Breed.  Stable.  They are following mammograms.

## 2013-01-12 NOTE — Assessment & Plan Note (Addendum)
She declines medication.  Continue calcium and vitamin D.  Continue weight bearing exercise.  Follow.  Recent vitamin D level wnl 01/03/13.

## 2013-01-12 NOTE — Assessment & Plan Note (Signed)
On no medication.  Desires not to take.  Takes red yeast rice.  Follow lipid panel.

## 2013-01-12 NOTE — Assessment & Plan Note (Signed)
Worked up and evaluated by Dr Markham Jordan.  He recommended to continue to follow liver panel   Last liver panel revealed stable AST 47.  Follow.

## 2013-01-20 NOTE — Progress Notes (Signed)
OFFICE PROGRESS NOTE  CC  Charm Barges, MD [address] [place] [address] Dr. Claud Kelp  DIAGNOSIS: 79 year old female with stage II invasive ductal carcinoma of the left breast measuring 2.2 cm ER +100% PR negative HER-2/neu negative with a proliferation marker 13% status post left mastectomy.  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  Her mastectomy was performed on 08/12/2011.   #2 patient has gone on to receive antiestrogen therapy with letrozole 2.5 mg daily. Thus far she is tolerating it well.    CURRENT THERAPY: Letrozole 2.5 mg daily since 09/01/2011   INTERVAL HISTORY: Cassidy Martinez 79 y.o. female returns for follow up appointment. She is tolerating Letrozole well. Denies any bone pain,myalgia,hot flashes,vaginal discharge or increased fatigue. No nausea, headache, or xerostomia. Activity level is adequate for her age. Last mammogram was performed on 07/12/11, negative. She is due for another one this year. She continues to take her Calcium and Vit D3 on a daily basis.Main complaint relates to the presence of a superficial pruritic rash in all extremities, first noticed about 2 months ago. No open lesions were seen by her. Rest of the 10 point ROS is negative  MEDICAL HISTORY: Past Medical History  Diagnosis Date  . Hyperlipidemia   . PONV (postoperative nausea and vomiting)   . Arthritis     hands  . Heart murmur   . History of UTI     "have had a few over the years; haven't had once since ~ 2007"  . Carpal tunnel syndrome, bilateral   . Migraines     "used to have them severe; stopped when I took self off estrogen"  . Breast CA     left;; S/P mastectomy 08/12/11    ALLERGIES:  is allergic to sulfa antibiotics;  macrobid; and penicillins.  MEDICATIONS:  Current Outpatient Prescriptions  Medication Sig Dispense Refill  . Ascorbic Acid (VITAMIN C PO) Take 600 mg by mouth daily.      Marland Kitchen CALCIUM PO Take 1,000 mg by mouth daily.      . carboxymethylcellulose 1 % ophthalmic solution Apply 1 drop to eye 2 (two) times daily.      . Cholecalciferol (VITAMIN D-3 PO) Take 1 capsule by mouth 2 (two) times daily.       Marland Kitchen FOLIC ACID PO Take 1 tablet by mouth daily.      Marland Kitchen letrozole (FEMARA) 2.5 MG tablet Take 1 tablet (2.5 mg total) by mouth daily.  90 tablet  12  . NON FORMULARY Take 1 capsule by mouth 4 (four) times daily. Vision Essential      . Red Yeast Rice Extract (RED YEAST RICE PO) Take 1 capsule by mouth 2 (two) times daily.      Marland Kitchen triamcinolone cream (KENALOG) 0.1 % Apply topically 2 (two) times daily.  30 g  0   No current facility-administered medications for this visit.    SURGICAL HISTORY:  Past Surgical History  Procedure Laterality Date  . Nasal sinus surgery    . Carpal tunnel release      right; "I've had it operated on twice"  . Scalp mass removal  2007    "knot; benign"  . Mastectomy complete / simple w/ sentinel node biopsy  08/12/11    axillary SNB;  left  . Appendectomy    . Breast lumpectomy  03/2009    "abnormal cells"; right  . Abdominal hysterectomy  ~ 1983  . Eye surgery  2006    repair of tear in retina  . Mastectomy w/ sentinel node biopsy  08/12/2011    Procedure: MASTECTOMY WITH SENTINEL LYMPH NODE BIOPSY;  Surgeon: Ernestene Mention, MD;  Location: [facility] OR;  Service: General;  Laterality: Left;    REVIEW OF SYSTEMS:  Pertinent items are noted in HPI.   HEALTH MAINTENANCE:   PHYSICAL EXAMINATION: Blood pressure 146/53, pulse 50, temperature 97.4 F (36.3 C), temperature source Oral, resp. rate 20, height 5\' 5"  (1.651 m), weight 100 lb 4.8 oz (45.496 kg). Body mass index is 16.69 kg/(m^2). ECOG PERFORMANCE STATUS: 0 - Asymptomatic   General appearance: alert,  cooperative and appears stated age Lymph nodes: Cervical, supraclavicular, and axillary nodes normal. Resp: clear to auscultation bilaterally Cardio: regular rate and rhythm GI: soft, non-tender; bowel sounds normal; no masses,  no organomegaly Extremities: extremities normal, atraumatic, no cyanosis or edema Neurologic: Grossly normal   LABORATORY DATA: Lab Results  Component Value Date   WBC 7.2 01/03/2013   HGB 13.9 01/03/2013   HCT 41.3 01/03/2013   MCV 89.9 01/03/2013   PLT 183 01/03/2013      Chemistry      Component Value Date/Time   NA 142 01/03/2013 1004   NA 138 12/09/2011 1454   K 4.2 01/03/2013 1004   K 3.8 12/09/2011 1454   CL 103 07/05/2012 0913   CL 102 12/09/2011 1454   CO2 28 01/03/2013 1004   CO2 28 12/09/2011 1454   BUN 19.1 01/03/2013 1004   BUN 16 12/09/2011 1454   CREATININE 1.0 01/03/2013 1004   CREATININE 0.79 12/09/2011 1454      Component Value Date/Time   CALCIUM 10.1 01/03/2013 1004   CALCIUM 9.8 12/09/2011 1454   ALKPHOS 76 01/03/2013 1004   ALKPHOS 89 12/09/2011 1454   AST 47* 01/03/2013 1004   AST 43* 12/09/2011 1454   ALT 21 01/03/2013 1004   ALT 17 12/09/2011 1454   BILITOT 0.78 01/03/2013 1004   BILITOT 0.3 12/09/2011 1454       RADIOGRAPHIC STUDIES:  No results found.  ASSESSMENT: Cassidy Martinez 79 y.o. female with   #1.stage II invasive ductal carcinoma of the left breast measured 2.2 cm ER positive PR negative HER-2/neu negative with a Ki-67 13%. She is status post mastectomy on 08/12/2011. She was then begun on letrozole 2.5 mg daily adjuvantly. Overall she is tolerating it well without any problems. Patient is still unsure about reconstruction.   #2. 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 Femara 2.5 mg daily has been reordered with 12 refills  2. Patient is due for yearly mammogram, to be arranged by PCP   3. Last bone  density scan was performed on 09/15/11. Continue to take Calcium and Vit D3     All questions were answered. The patient knows to call the clinic with any problems, questions or concerns. We can certainly see the patient much sooner if necessary.  I spent 25 minutes counseling the patient face to face. The total time spent in the appointment was 30 minutes.    Drue Second, MD Medical/Oncology [facility] [phone] (beeper) [phone] (Office)

## 2013-03-28 IMAGING — CR DG CHEST 2V
2 series · 2 of 2 positions shown · non-contrast
Comparison: 05/14/2009

CLINICAL DATA: Preop for mastectomy and sentinel lymph node biopsy

CHEST - 2 VIEW

[view not recorded (1 of 2)]
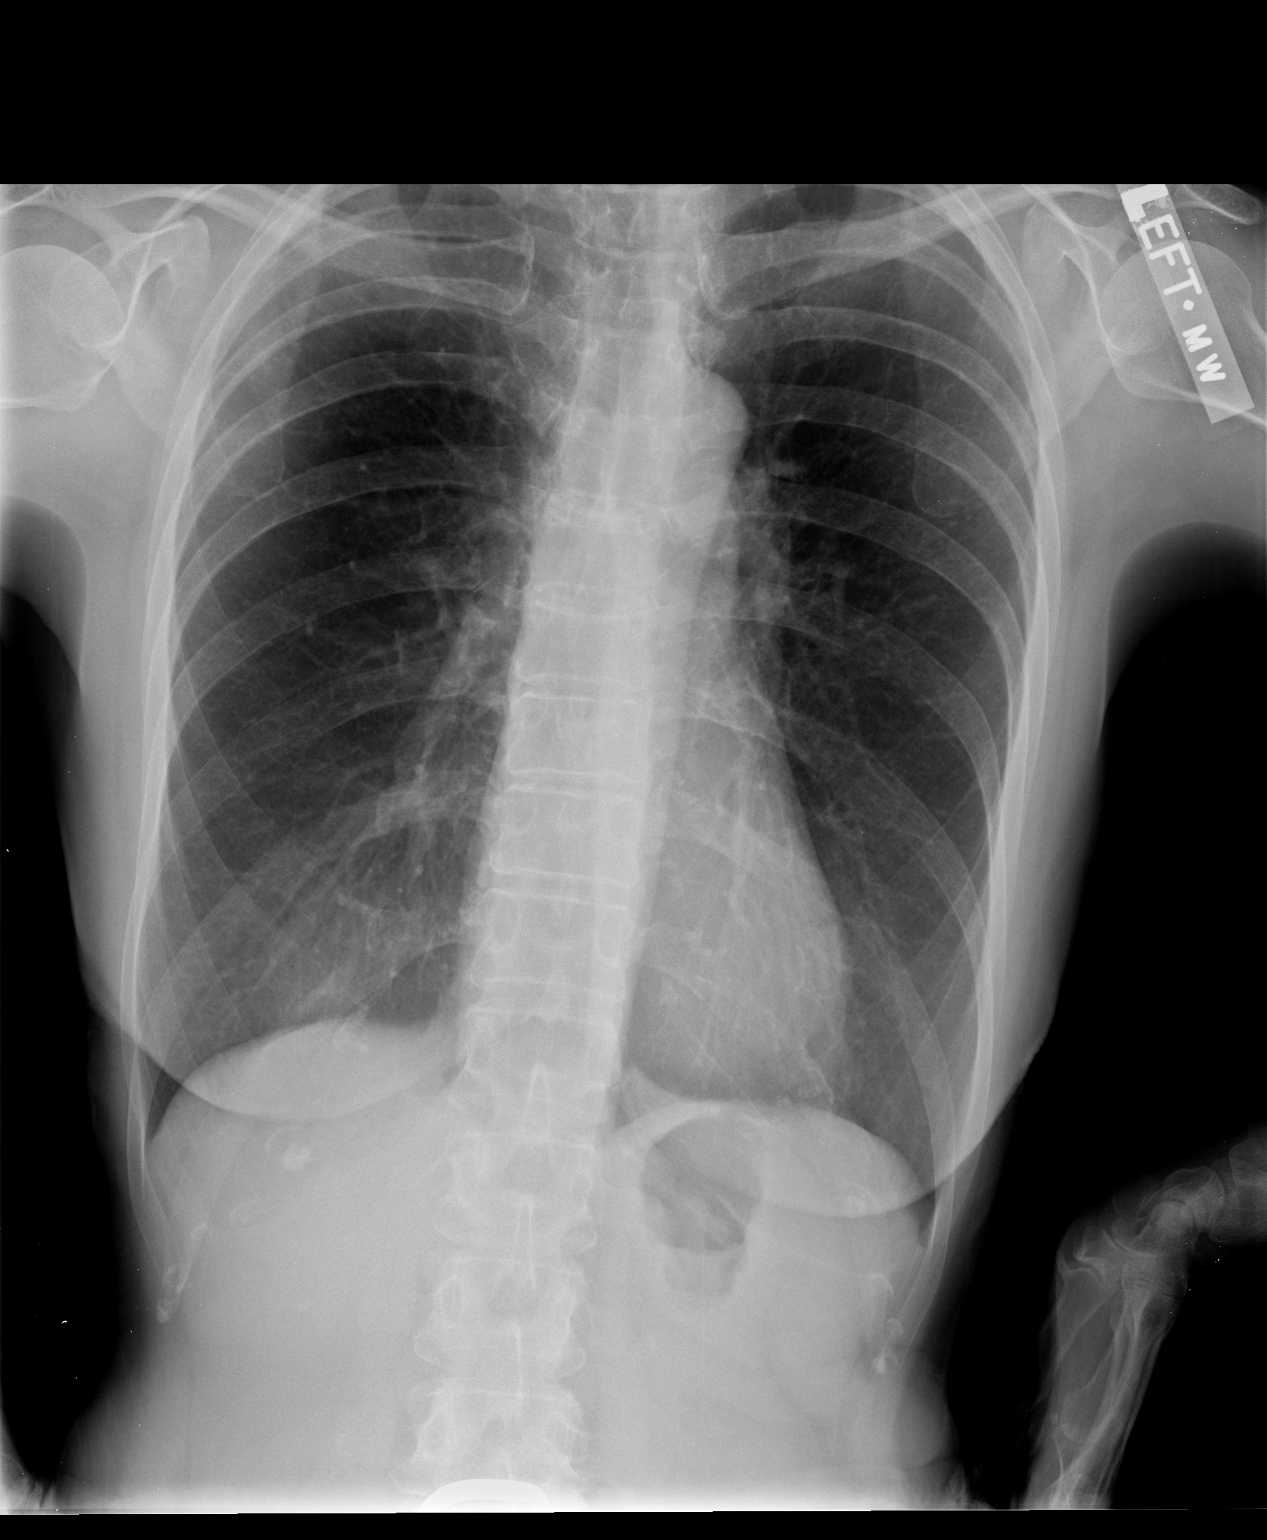

[view not recorded (2 of 2)]
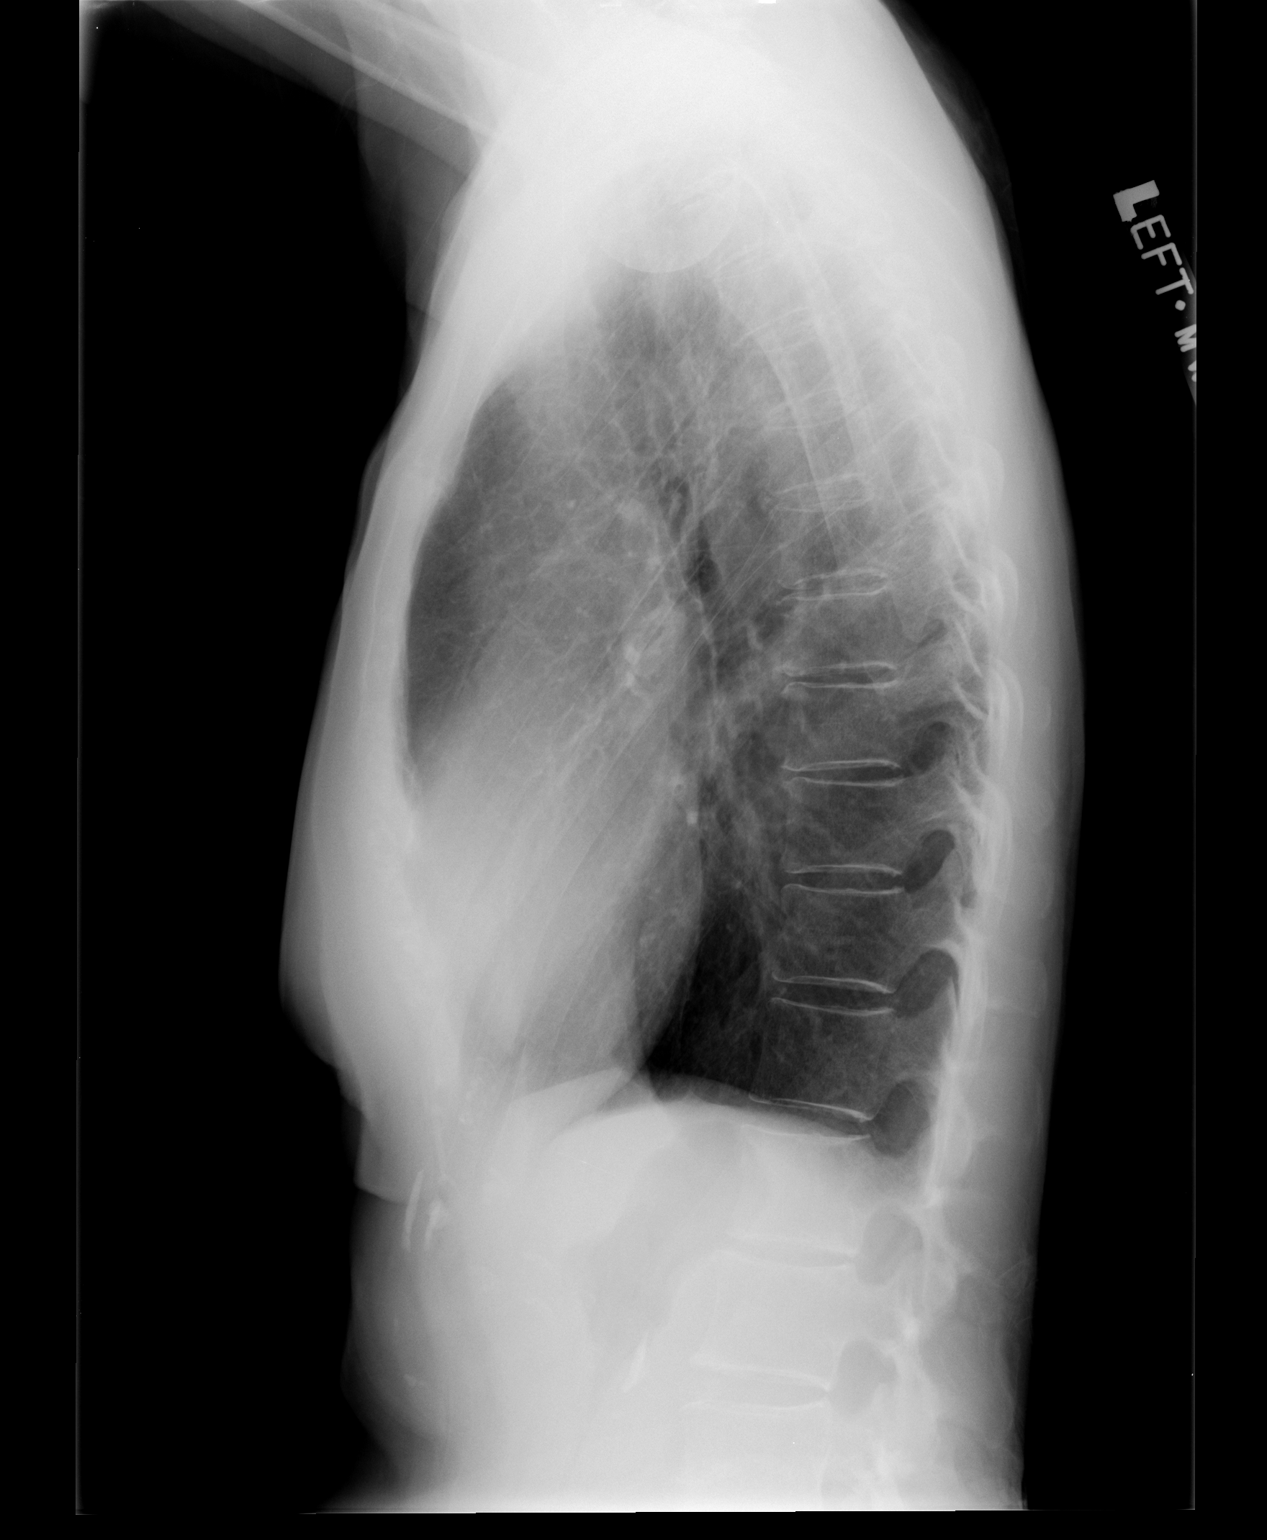

[2 of 2 positions shown; findings below may reference images not displayed]

FINDINGS: Cardiomediastinal silhouette is stable.  Stable minimal
levoscoliosis upper thoracic spine.  Mild hyperinflation again
noted.  No acute infiltrate or pulmonary edema.
IMPRESSION: No active disease.  Mild hyperinflation again noted.

## 2013-05-30 ENCOUNTER — Ambulatory Visit (INDEPENDENT_AMBULATORY_CARE_PROVIDER_SITE_OTHER): Payer: Medicare Other | Admitting: Family Medicine

## 2013-05-30 ENCOUNTER — Telehealth: Payer: Self-pay | Admitting: Internal Medicine

## 2013-05-30 ENCOUNTER — Encounter: Payer: Self-pay | Admitting: Family Medicine

## 2013-05-30 VITALS — BP 130/64 | HR 76 | Temp 98.4°F | Wt 102.5 lb

## 2013-05-30 DIAGNOSIS — J069 Acute upper respiratory infection, unspecified: Secondary | ICD-10-CM | POA: Diagnosis not present

## 2013-05-30 MED ORDER — LORATADINE 10 MG PO TABS: 10.0000 mg | ORAL_TABLET | Freq: Every day | ORAL | Status: DC

## 2013-05-30 MED ORDER — BENZONATATE 200 MG PO CAPS: 200.0000 mg | ORAL_CAPSULE | Freq: Three times a day (TID) | ORAL | Status: DC | PRN

## 2013-05-30 NOTE — Telephone Encounter (Signed)
Patient Information:  Caller Name: [place]  Phone: [phone]  Patient: Cassidy Martinez  Gender: Female  DOB: 06/05/1932  Age: 79 Years  PCP: Dale Wahkon  Office Follow Up:  Does the office need to follow up with this patient?: No  Instructions For The Office: N/A  RN Note:  Upper respiratory symptoms without fever. Right ear congestion without pain. No appointments remain at [facility] for 05/30/13 or 05/31/13.  Scheduled for 1600 05/30/13 with Dr Para March at [facility].   Symptoms  Reason For Call & Symptoms: Malaise, mild scratchy throat, body aches and head congestion.  Hoarseness present.  Reviewed Health History In EMR: Yes  Reviewed Medications In EMR: Yes  Reviewed Allergies In EMR: Yes  Reviewed Surgeries / Procedures: Yes  Date of Onset of Symptoms: 05/26/2013  Treatments Tried: Nasal saline  Treatments Tried Worked: No  Guideline(s) Used:  Colds  Disposition Per Guideline:   See Today or Tomorrow in Office  Reason For Disposition Reached:   Patient wants to be seen  Advice Given:  Reassurance  It sounds like an uncomplicated cold that we can treat at home.  Colds are very common and may make you feel uncomfortable.  Colds are caused by viruses, and no medicine or "shot" will cure an uncomplicated cold.  Colds are usually not serious.  Here is some care advice that should help.  For a Runny Nose With Profuse Discharge:   Nasal mucus and discharge helps to wash viruses and bacteria out of the nose and sinuses.  Blowing the nose is all that is needed.  For a Stuffy Nose - Use Nasal Washes:  Introduction: Saline (salt water) nasal irrigation (nasal wash) is an effective and simple home remedy for treating stuffy nose and sinus congestion. The nose can be irrigated by pouring, spraying, or squirting salt water into the nose and then letting it run back out.  How it Helps: The salt water rinses out excess mucus, washes out any irritants (dust, allergens) that  might be present, and moistens the nasal cavity.  Methods: There are several ways to perform nasal irrigation. You can use a saline nasal spray bottle (available over-the-counter), a rubber ear syringe, a medical syringe without the needle, or a Neti Pot.  Treatment for Associated Symptoms of Colds:  For muscle aches, headaches, or moderate fever (more than 101 F or 38.9 C): Take acetaminophen every 4 hours.  Sore throat: Try throat lozenges, hard candy, or warm chicken broth.  Hydrate: Drink adequate liquids.  Humidifier:  If the air in your home is dry, use a cool-mist humidifier  Contagiousness:  The cold virus is present in your nasal secretions.  Cover your nose and mouth with a tissue when you sneeze or cough.  Wash your hands frequently with soap and water.  You can return to work or school after the fever is gone and you feel well enough to participate in normal activities.  Expected Course:   Fever may last 2-3 days  Nasal discharge 7-14 days  Cough up to 2-3 weeks.  Call Back If:  Difficulty breathing occurs  Nasal discharge lasts more than 10 days  Cough lasts more than 3 weeks  You become worse  Patient Will Follow Care Advice:  YES

## 2013-05-30 NOTE — Telephone Encounter (Signed)
Pt states she "feels so bad" she will keep the appt with Dr. Para March today.

## 2013-05-30 NOTE — Telephone Encounter (Signed)
Sch to see Dr. Para March today at [facility]

## 2013-05-30 NOTE — Assessment & Plan Note (Signed)
Likely viral, nontoxic.  See instructions.  D/w pt.  Fu prn.

## 2013-05-30 NOTE — Progress Notes (Signed)
Pre-visit discussion using our clinic review tool. No additional management support is needed unless otherwise documented below in the visit note.  Sx started about 5 days ago.  "I kept thinking it was working at [employer] and working."  As the week went on she was having runny nose, fatigue, some frontal HA. Had a feeling of bilaterl leg weakness, no focal weakness. Coughing. Some scant sputum, slightly thicker than normal.  Mild R ear pain. No ST.  No rash.  Took some OTC cough drops; some help.  Rhinorrhea and cough are most bothersome.    Meds, vitals, and allergies reviewed.   ROS: See HPI.  Otherwise, noncontributory.  GEN: nad, alert and oriented HEENT: mucous membranes moist, tm w/o erythema, nasal exam w/o erythema, clear discharge noted, small amount of clotted blood noted medially on L nostril,  OP with cobblestoning, sinuses not ttp NECK: supple w/o LA CV: rrr.   PULM: ctab, no inc wob EXT: no edema SKIN: no acute rash

## 2013-05-30 NOTE — Telephone Encounter (Signed)
I am fine with her being seen today or I can see her tomorrow at 3:15 if she desires to come here.  Whatever is more convenient for her.  Thanks.

## 2013-05-30 NOTE — Patient Instructions (Signed)
Tessalon and cough drops as needed for cough.  Loratadine 10mg  (OTC) for the runny nose.  Take care. Get some rest.  Glad to see you.

## 2013-06-07 ENCOUNTER — Encounter: Payer: Self-pay | Admitting: Adult Health

## 2013-06-07 ENCOUNTER — Ambulatory Visit (INDEPENDENT_AMBULATORY_CARE_PROVIDER_SITE_OTHER): Payer: Medicare Other | Admitting: Adult Health

## 2013-06-07 VITALS — BP 106/56 | HR 72 | Temp 97.4°F | Resp 12 | Wt 101.5 lb

## 2013-06-07 DIAGNOSIS — J069 Acute upper respiratory infection, unspecified: Secondary | ICD-10-CM

## 2013-06-07 MED ORDER — AZITHROMYCIN 250 MG PO TABS: ORAL_TABLET | ORAL | Status: DC

## 2013-06-07 MED ORDER — BENZONATATE 200 MG PO CAPS: 200.0000 mg | ORAL_CAPSULE | Freq: Three times a day (TID) | ORAL | Status: DC | PRN

## 2013-06-07 NOTE — Progress Notes (Signed)
Pre visit review using our clinic review tool, if applicable. No additional management support is needed unless otherwise documented below in the visit note. 

## 2013-06-07 NOTE — Patient Instructions (Signed)
  Please schedule a Nurse visit for right ear cleaning.  Start Azithromycin as directed.  Continue with benzonatate for cough as prescribed by Dr. Para March.  You may use saline nasal spray to keep sinuses moist and help irrigate.

## 2013-06-07 NOTE — Progress Notes (Signed)
   Subjective:    Patient ID: Cassidy Martinez, female    DOB: 07-23-1931, 79 y.o.   MRN: [id number]  HPI  Patient is a pleasant 79 year old female who presents to clinic with ongoing symptoms of coughing, rhinorrhea, postnasal drip that has been ongoing for greater than 10 days. Patient was seen on 05/30/2013. She was prescribed Tessalon for cough which she reports has improved her symptom. She is still not feeling well. Also reports having pressure within right ear. She reports that secretions are now yellow  Current Outpatient Prescriptions on File Prior to Visit  Medication Sig Dispense Refill  . Ascorbic Acid (VITAMIN C PO) Take 600 mg by mouth daily.      Marland Kitchen CALCIUM PO Take 1,000 mg by mouth daily.      . carboxymethylcellulose 1 % ophthalmic solution Apply 1 drop to eye 2 (two) times daily.      . Cholecalciferol (VITAMIN D-3 PO) Take 1 capsule by mouth 2 (two) times daily.       Marland Kitchen FOLIC ACID PO Take 1 tablet by mouth daily.      Marland Kitchen letrozole (FEMARA) 2.5 MG tablet Take 1 tablet (2.5 mg total) by mouth daily.  90 tablet  12  . NON FORMULARY Take 1 capsule by mouth 4 (four) times daily. [facility] Essential      . Red Yeast Rice Extract (RED YEAST RICE PO) Take 1 capsule by mouth 2 (two) times daily.      Marland Kitchen triamcinolone cream (KENALOG) 0.1 % Apply topically 2 (two) times daily.  30 g  0  . loratadine (CLARITIN) 10 MG tablet Take 1 tablet (10 mg total) by mouth daily.       No current facility-administered medications on file prior to visit.     Review of Systems  HENT: Positive for congestion, ear pain, postnasal drip, rhinorrhea and sinus pressure. Negative for sore throat.        Yellow secretions  Respiratory: Positive for cough. Negative for shortness of breath and wheezing.        Objective:   Physical Exam  Constitutional: She is oriented to person, place, and time. She appears well-developed and well-nourished. No distress.  HENT:  Head: Normocephalic and atraumatic.  Left  Ear: External ear normal.  Nose: Nose normal.  Right ear with cerumen impaction. Pharyngeal erythema.  Cardiovascular: Normal rate, regular rhythm and normal heart sounds.  Exam reveals no gallop.   No murmur heard. Pulmonary/Chest: Effort normal and breath sounds normal. No respiratory distress. She has no wheezes. She has no rales.  Lymphadenopathy:    She has cervical adenopathy.  Neurological: She is alert and oriented to person, place, and time.  Skin: Skin is warm and dry.  Psychiatric: She has a normal mood and affect. Her behavior is normal. Judgment and thought content normal.   BP 106/56  Pulse 72  Temp(Src) 97.4 F (36.3 C) (Oral)  Resp 12  Wt 101 lb 8 oz (46.04 kg)  SpO2 97%        Assessment & Plan:

## 2013-06-07 NOTE — Assessment & Plan Note (Signed)
Symptoms ongoing for greater than 10 days. She is allergic to multiple antibiotics. Will start azithromycin. Continue Tessalon. May use saline spray to irrigate sinuses. RTC if symptoms are not improved within the next 4-5 days.

## 2013-06-12 ENCOUNTER — Ambulatory Visit: Payer: Medicare Other

## 2013-06-12 ENCOUNTER — Ambulatory Visit (INDEPENDENT_AMBULATORY_CARE_PROVIDER_SITE_OTHER): Payer: Medicare Other | Admitting: *Deleted

## 2013-06-12 DIAGNOSIS — H6120 Impacted cerumen, unspecified ear: Secondary | ICD-10-CM

## 2013-06-12 DIAGNOSIS — H6121 Impacted cerumen, right ear: Secondary | ICD-10-CM

## 2013-06-20 ENCOUNTER — Other Ambulatory Visit (INDEPENDENT_AMBULATORY_CARE_PROVIDER_SITE_OTHER): Payer: Self-pay | Admitting: General Surgery

## 2013-06-20 DIAGNOSIS — Z9012 Acquired absence of left breast and nipple: Secondary | ICD-10-CM

## 2013-06-20 DIAGNOSIS — Z1231 Encounter for screening mammogram for malignant neoplasm of breast: Secondary | ICD-10-CM

## 2013-07-04 ENCOUNTER — Encounter: Payer: Self-pay | Admitting: Oncology

## 2013-07-04 ENCOUNTER — Telehealth: Payer: Self-pay | Admitting: Oncology

## 2013-07-04 ENCOUNTER — Encounter (INDEPENDENT_AMBULATORY_CARE_PROVIDER_SITE_OTHER): Payer: Self-pay

## 2013-07-04 ENCOUNTER — Other Ambulatory Visit (HOSPITAL_BASED_OUTPATIENT_CLINIC_OR_DEPARTMENT_OTHER): Payer: Medicare Other

## 2013-07-04 ENCOUNTER — Ambulatory Visit (HOSPITAL_BASED_OUTPATIENT_CLINIC_OR_DEPARTMENT_OTHER): Payer: Medicare Other | Admitting: Oncology

## 2013-07-04 VITALS — BP 136/75 | HR 53 | Temp 97.4°F | Resp 18 | Ht 64.0 in | Wt 102.2 lb

## 2013-07-04 DIAGNOSIS — Z170 Estrogen receptor positive status [ER+]: Secondary | ICD-10-CM | POA: Diagnosis not present

## 2013-07-04 DIAGNOSIS — Z9010 Acquired absence of unspecified breast and nipple: Secondary | ICD-10-CM | POA: Diagnosis not present

## 2013-07-04 DIAGNOSIS — C50419 Malignant neoplasm of upper-outer quadrant of unspecified female breast: Secondary | ICD-10-CM

## 2013-07-04 DIAGNOSIS — C50912 Malignant neoplasm of unspecified site of left female breast: Secondary | ICD-10-CM

## 2013-07-04 DIAGNOSIS — C50919 Malignant neoplasm of unspecified site of unspecified female breast: Secondary | ICD-10-CM

## 2013-07-04 LAB — CBC WITH DIFFERENTIAL/PLATELET
BASO%: 1.2 % (ref 0.0–2.0)
Basophils Absolute: 0.1 10*3/uL (ref 0.0–0.1)
EOS%: 4 % (ref 0.0–7.0)
Eosinophils Absolute: 0.3 10*3/uL (ref 0.0–0.5)
HCT: 38.8 % (ref 34.8–46.6)
HGB: 12.9 g/dL (ref 11.6–15.9)
LYMPH%: 22.5 % (ref 14.0–49.7)
MCH: 29.7 pg (ref 25.1–34.0)
MCHC: 33.2 g/dL (ref 31.5–36.0)
MCV: 89.4 fL (ref 79.5–101.0)
MONO#: 0.5 10*3/uL (ref 0.1–0.9)
MONO%: 7.6 % (ref 0.0–14.0)
NEUT#: 4.2 10*3/uL (ref 1.5–6.5)
NEUT%: 64.7 % (ref 38.4–76.8)
Platelets: 188 10*3/uL (ref 145–400)
RBC: 4.34 10*6/uL (ref 3.70–5.45)
RDW: 13.6 % (ref 11.2–14.5)
WBC: 6.5 10*3/uL (ref 3.9–10.3)
lymph#: 1.5 10*3/uL (ref 0.9–3.3)

## 2013-07-04 LAB — COMPREHENSIVE METABOLIC PANEL (CC13)
ALBUMIN: 3.5 g/dL (ref 3.5–5.0)
ALK PHOS: 66 U/L (ref 40–150)
ALT: 15 U/L (ref 0–55)
AST: 39 U/L — AB (ref 5–34)
Anion Gap: 7 mEq/L (ref 3–11)
BUN: 24.1 mg/dL (ref 7.0–26.0)
CO2: 28 mEq/L (ref 22–29)
Calcium: 9.9 mg/dL (ref 8.4–10.4)
Chloride: 106 mEq/L (ref 98–109)
Creatinine: 0.9 mg/dL (ref 0.6–1.1)
Glucose: 79 mg/dl (ref 70–140)
POTASSIUM: 4.5 meq/L (ref 3.5–5.1)
SODIUM: 141 meq/L (ref 136–145)
TOTAL PROTEIN: 6.4 g/dL (ref 6.4–8.3)
Total Bilirubin: 0.52 mg/dL (ref 0.20–1.20)

## 2013-07-04 MED ORDER — LETROZOLE 2.5 MG PO TABS: 2.5000 mg | ORAL_TABLET | Freq: Every day | ORAL | Status: DC

## 2013-07-04 NOTE — Telephone Encounter (Signed)
, °

## 2013-07-04 NOTE — Progress Notes (Signed)
OFFICE PROGRESS NOTE  CC  Cassidy Graff, MD [address] [place] [address] Dr. [name]  DIAGNOSIS: 80 year old female with stage II invasive ductal carcinoma of the left breast measuring 2.2 cm ER +100% PR negative HER-2/neu negative with a proliferation marker 13% status post left mastectomy.  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  Her mastectomy was performed on 08/12/2011.   #2 patient has gone on to receive antiestrogen therapy with letrozole 2.5 mg daily. Thus far she is tolerating it well.    CURRENT THERAPY: Letrozole 2.5 mg daily since 09/01/2011   INTERVAL HISTORY: Cassidy Martinez 80 y.o. female returns for follow up appointment. She is tolerating Letrozole well. Denies any bone pain,myalgia,hot flashes,vaginal discharge or increased fatigue. No nausea, headache, or xerostomia. Activity level is adequate for her age. Last mammogram was performed on 07/12/11, negative. She is due for another one this year. She continues to take her Calcium and Vit D3 on a daily basis.Main complaint relates to the presence of a superficial pruritic rash in all extremities, first noticed about 2 months ago. No open lesions were seen by her. Rest of the 10 point ROS is negative  MEDICAL HISTORY: Past Medical History  Diagnosis Date  . Hyperlipidemia   . PONV (postoperative nausea and vomiting)   . Arthritis     hands  . Heart murmur   . History of UTI     "have had a few over the years; haven't had once since ~ 2007"  . Carpal tunnel syndrome, bilateral   . Migraines     "used to have them severe; stopped when I took self off estrogen"  . Breast CA     left;; S/P mastectomy 08/12/11    ALLERGIES:  is allergic to sulfa antibiotics;  macrobid; and penicillins.  MEDICATIONS:  Current Outpatient Prescriptions  Medication Sig Dispense Refill  . Ascorbic Acid (VITAMIN C PO) Take 600 mg by mouth daily.      Marland Kitchen azithromycin (ZITHROMAX) 250 MG tablet Take 2 tablets today then take 1 tablet daily for the next 4 days.  6 tablet  0  . benzonatate (TESSALON) 200 MG capsule Take 1 capsule (200 mg total) by mouth 3 (three) times daily as needed for cough.  30 capsule  0  . CALCIUM PO Take 1,000 mg by mouth daily.      . carboxymethylcellulose 1 % ophthalmic solution Apply 1 drop to eye 2 (two) times daily.      . Cholecalciferol (VITAMIN D-3 PO) Take 1 capsule by mouth 2 (two) times daily.       Marland Kitchen FOLIC ACID PO Take 1 tablet by mouth daily.      Marland Kitchen letrozole (FEMARA) 2.5 MG tablet Take 1 tablet (2.5 mg total) by mouth daily.  90 tablet  12  . loratadine (CLARITIN) 10 MG tablet Take 1 tablet (10 mg total) by mouth daily.      . NON FORMULARY Take 1 capsule by mouth 4 (four) times daily. Vision Essential      . Red Yeast Rice Extract (RED YEAST RICE PO) Take 1 capsule by mouth 2 (two) times daily.      Marland Kitchen triamcinolone cream (KENALOG) 0.1 % Apply topically 2 (two) times daily.  30 g  0  No current facility-administered medications for this visit.    SURGICAL HISTORY:  Past Surgical History  Procedure Laterality Date  . Nasal sinus surgery    . Carpal tunnel release      right; "I've had it operated on twice"  . Scalp mass removal  2007    "knot; benign"  . Mastectomy complete / simple w/ sentinel node biopsy  08/12/11    axillary SNB; left  . Appendectomy    . Breast lumpectomy  03/2009    "abnormal cells"; right  . Abdominal hysterectomy  ~ 1983  . Eye surgery  2006    repair of tear in retina  . Mastectomy w/ sentinel node biopsy  08/12/2011    Procedure: MASTECTOMY WITH SENTINEL LYMPH NODE BIOPSY;  Surgeon: Adin Hector, MD;  Location: [facility];  Service: General;  Laterality: Left;    REVIEW OF SYSTEMS:  Pertinent items  are noted in HPI.   HEALTH MAINTENANCE:   PHYSICAL EXAMINATION: Blood pressure 136/75, pulse 53, temperature 97.4 F (36.3 C), temperature source Oral, resp. rate 18, height _0  (1.626 m), weight 102 lb 3.2 oz (46.358 kg). Body mass index is 17.53 kg/(m^2). ECOG PERFORMANCE STATUS: 0 - Asymptomatic   General appearance: alert, cooperative and appears stated age Lymph nodes: Cervical, supraclavicular, and axillary nodes normal. Resp: clear to auscultation bilaterally Cardio: regular rate and rhythm GI: soft, non-tender; bowel sounds normal; no masses,  no organomegaly Extremities: extremities normal, atraumatic, no cyanosis or edema Neurologic: Grossly normal   LABORATORY DATA: Lab Results  Component Value Date   WBC 6.5 07/04/2013   HGB 12.9 07/04/2013   HCT 38.8 07/04/2013   MCV 89.4 07/04/2013   PLT 188 07/04/2013      Chemistry      Component Value Date/Time   NA 141 07/04/2013 0953   NA 138 12/09/2011 1454   K 4.5 07/04/2013 0953   K 3.8 12/09/2011 1454   CL 103 07/05/2012 0913   CL 102 12/09/2011 1454   CO2 28 07/04/2013 0953   CO2 28 12/09/2011 1454   BUN 24.1 07/04/2013 0953   BUN 16 12/09/2011 1454   CREATININE 0.9 07/04/2013 0953   CREATININE 0.79 12/09/2011 1454      Component Value Date/Time   CALCIUM 9.9 07/04/2013 0953   CALCIUM 9.8 12/09/2011 1454   ALKPHOS 66 07/04/2013 0953   ALKPHOS 89 12/09/2011 1454   AST 39* 07/04/2013 0953   AST 43* 12/09/2011 1454   ALT 15 07/04/2013 0953   ALT 17 12/09/2011 1454   BILITOT 0.52 07/04/2013 0953   BILITOT 0.3 12/09/2011 1454       RADIOGRAPHIC STUDIES:  No results found.  ASSESSMENT: Cassidy Martinez 80 y.o. female with   #1.stage II invasive ductal carcinoma of the left breast measured 2.2 cm ER positive PR negative HER-2/neu negative with a Ki-67 13%. She is status post mastectomy on 08/12/2011. She was then begun on letrozole 2.5 mg daily adjuvantly. Overall she is tolerating it well without any problems. Patient is still  unsure about reconstruction.   #2. 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 Femara 2.5 mg daily has been reordered with 12 refills  2. Patient is due for yearly mammogram, to be arranged by PCP   3. Last bone density scan was performed on 09/15/11. Continue to take Calcium and Vit D3  of the  All questions were answered. The patient knows to call the clinic with any problems, questions or concerns. We can certainly see the patient much sooner if necessary.  I spent 25 minutes counseling the patient face to face. The total time spent in the appointment was 30 minutes.    Marcy Panning, MD Medical/Oncology [facility] [phone] (beeper) [phone] (Office)

## 2013-07-09 ENCOUNTER — Other Ambulatory Visit (INDEPENDENT_AMBULATORY_CARE_PROVIDER_SITE_OTHER): Payer: Medicare Other

## 2013-07-09 DIAGNOSIS — E7800 Pure hypercholesterolemia, unspecified: Secondary | ICD-10-CM | POA: Diagnosis not present

## 2013-07-09 DIAGNOSIS — K769 Liver disease, unspecified: Secondary | ICD-10-CM

## 2013-07-09 DIAGNOSIS — R634 Abnormal weight loss: Secondary | ICD-10-CM

## 2013-07-09 DIAGNOSIS — C50412 Malignant neoplasm of upper-outer quadrant of left female breast: Secondary | ICD-10-CM

## 2013-07-09 DIAGNOSIS — R945 Abnormal results of liver function studies: Secondary | ICD-10-CM

## 2013-07-09 DIAGNOSIS — C50419 Malignant neoplasm of upper-outer quadrant of unspecified female breast: Secondary | ICD-10-CM

## 2013-07-09 LAB — CBC WITH DIFFERENTIAL/PLATELET
BASOS PCT: 0.9 % (ref 0.0–3.0)
Basophils Absolute: 0.1 10*3/uL (ref 0.0–0.1)
EOS ABS: 0.5 10*3/uL (ref 0.0–0.7)
Eosinophils Relative: 6.6 % — ABNORMAL HIGH (ref 0.0–5.0)
HCT: 40.9 % (ref 36.0–46.0)
Hemoglobin: 13.8 g/dL (ref 12.0–15.0)
Lymphocytes Relative: 26.1 % (ref 12.0–46.0)
Lymphs Abs: 1.8 10*3/uL (ref 0.7–4.0)
MCHC: 33.7 g/dL (ref 30.0–36.0)
MCV: 88.3 fl (ref 78.0–100.0)
MONO ABS: 0.6 10*3/uL (ref 0.1–1.0)
Monocytes Relative: 9 % (ref 3.0–12.0)
NEUTROS PCT: 57.4 % (ref 43.0–77.0)
Neutro Abs: 4 10*3/uL (ref 1.4–7.7)
Platelets: 187 10*3/uL (ref 150.0–400.0)
RBC: 4.63 Mil/uL (ref 3.87–5.11)
RDW: 13.9 % (ref 11.5–14.6)
WBC: 6.9 10*3/uL (ref 4.5–10.5)

## 2013-07-09 LAB — COMPREHENSIVE METABOLIC PANEL
ALBUMIN: 3.8 g/dL (ref 3.5–5.2)
ALK PHOS: 64 U/L (ref 39–117)
ALT: 19 U/L (ref 0–35)
AST: 43 U/L — AB (ref 0–37)
BILIRUBIN TOTAL: 1.1 mg/dL (ref 0.3–1.2)
BUN: 26 mg/dL — AB (ref 6–23)
CO2: 30 mEq/L (ref 19–32)
CREATININE: 0.9 mg/dL (ref 0.4–1.2)
Calcium: 9.9 mg/dL (ref 8.4–10.5)
Chloride: 105 mEq/L (ref 96–112)
GFR: 63.28 mL/min (ref >60.00)
Glucose, Bld: 85 mg/dL (ref 70–99)
POTASSIUM: 4.1 meq/L (ref 3.5–5.1)
Sodium: 140 mEq/L (ref 135–145)
Total Protein: 6.8 g/dL (ref 6.0–8.3)

## 2013-07-09 LAB — LIPID PANEL
CHOL/HDL RATIO: 3
CHOLESTEROL: 281 mg/dL — AB (ref 0–200)
HDL: 87.2 mg/dL (ref >39.00)
TRIGLYCERIDES: 60 mg/dL (ref 0.0–149.0)
VLDL: 12 mg/dL (ref 0.0–40.0)

## 2013-07-09 LAB — LDL CHOLESTEROL, DIRECT: Direct LDL: 174.2 mg/dL

## 2013-07-09 LAB — TSH: TSH: 1.32 u[IU]/mL (ref 0.35–5.50)

## 2013-07-11 ENCOUNTER — Ambulatory Visit (INDEPENDENT_AMBULATORY_CARE_PROVIDER_SITE_OTHER): Payer: Medicare Other | Admitting: Internal Medicine

## 2013-07-11 ENCOUNTER — Encounter: Payer: Self-pay | Admitting: Oncology

## 2013-07-11 VITALS — BP 100/50 | HR 62 | Temp 97.9°F | Ht 64.0 in | Wt 104.2 lb

## 2013-07-11 DIAGNOSIS — K769 Liver disease, unspecified: Secondary | ICD-10-CM

## 2013-07-11 DIAGNOSIS — Z1211 Encounter for screening for malignant neoplasm of colon: Secondary | ICD-10-CM

## 2013-07-11 DIAGNOSIS — E7800 Pure hypercholesterolemia, unspecified: Secondary | ICD-10-CM | POA: Diagnosis not present

## 2013-07-11 DIAGNOSIS — M810 Age-related osteoporosis without current pathological fracture: Secondary | ICD-10-CM | POA: Diagnosis not present

## 2013-07-11 DIAGNOSIS — R945 Abnormal results of liver function studies: Secondary | ICD-10-CM

## 2013-07-11 DIAGNOSIS — C50419 Malignant neoplasm of upper-outer quadrant of unspecified female breast: Secondary | ICD-10-CM

## 2013-07-11 NOTE — Progress Notes (Signed)
Pre-visit discussion using our clinic review tool. No additional management support is needed unless otherwise documented below in the visit note.  

## 2013-07-14 ENCOUNTER — Encounter: Payer: Self-pay | Admitting: Internal Medicine

## 2013-07-14 NOTE — Assessment & Plan Note (Signed)
Worked up and evaluated by Dr Elliot.  He recommended to continue to follow liver panel   Last liver panel revealed stable.   Follow.   

## 2013-07-14 NOTE — Assessment & Plan Note (Signed)
Followed by Dr Chancy Milroy.  Stable.  Last mammogram 07/17/12. Schedule f/u mammogram.

## 2013-07-14 NOTE — Progress Notes (Signed)
Subjective:    Patient ID: Cassidy Martinez, female    DOB: 1932-03-05, 80 y.o.   MRN: [id number]  HPI 80 year old female with past history of breast cancer followed by Dr Chancy Milroy, reoccurring allergy and sinus problems and hypercholesterolemia who comes in today to follow up on these issues as well as for a complete physical exam.  Seeing oncology.  Doing well.  On femara and tolerating.  Due a mammogram.  Eating and drinking well.  No nausea or vomiting.  Bowels stable.  Seeing Dr Tomi Likens for her eyes.  Being followed for macular degeneration.     Past Medical History  Diagnosis Date  . Hyperlipidemia   . PONV (postoperative nausea and vomiting)   . Arthritis     hands  . Heart murmur   . History of UTI     "have had a few over the years; haven't had once since ~ 2007"  . Carpal tunnel syndrome, bilateral   . Migraines     "used to have them severe; stopped when I took self off estrogen"  . Breast CA     left;; S/P mastectomy 08/12/11    Current Outpatient Prescriptions on File Prior to Visit  Medication Sig Dispense Refill  . Ascorbic Acid (VITAMIN C PO) Take 600 mg by mouth daily.      Marland Kitchen CALCIUM PO Take 1,000 mg by mouth daily.      . carboxymethylcellulose 1 % ophthalmic solution Apply 1 drop to eye 2 (two) times daily.      . Cholecalciferol (VITAMIN D-3 PO) Take 1 capsule by mouth 2 (two) times daily.       Marland Kitchen FOLIC ACID PO Take 1 tablet by mouth daily.      Marland Kitchen letrozole (FEMARA) 2.5 MG tablet Take 1 tablet (2.5 mg total) by mouth daily.  90 tablet  12  . NON FORMULARY Take 1 capsule by mouth 4 (four) times daily. Vision Essential      . Red Yeast Rice Extract (RED YEAST RICE PO) Take 1 capsule by mouth 2 (two) times daily.      Marland Kitchen triamcinolone cream (KENALOG) 0.1 % Apply topically 2 (two) times daily.  30 g  0  . benzonatate (TESSALON) 200 MG capsule Take 1 capsule (200 mg total) by mouth 3 (three) times daily as needed for cough.  30 capsule  0   No current facility-administered  medications on file prior to visit.    Review of Systems Patient denies any headache, lightheadedness or dizziness.  No significant sinus or allergy symptoms.  No chest pain, tightness or palpitations.  No increased shortness of breath, cough or congestion.  No nausea or vomiting.  No acid reflux.  No abdominal pain or cramping.  No bowel change, such as diarrhea, constipation, BRBPR or melana.  No urine change.  No vaginal problems.  Tolerating the femara.       Objective:   Physical Exam  Filed Vitals:   07/11/13 1039  BP: 100/50  Pulse: 62  Temp: 97.9 F (59.22 C)   80 year old female in no acute distress.   HEENT:  Nares- clear.  Oropharynx - without lesions. NECK:  Supple.  Nontender.  No audible bruit.  HEART:  Appears to be regular. LUNGS:  No crackles or wheezing audible.  Respirations even and unlabored.  RADIAL PULSE:  Equal bilaterally.    BREASTS:  No nipple discharge or nipple retraction present.  Could not appreciate any distinct nodules or  axillary adenopathy.  ABDOMEN:  Soft, nontender.  Bowel sounds present and normal.  No audible abdominal bruit.  GU:  Not performed.     EXTREMITIES:  No increased edema present.  DP pulses palpable and equal bilaterally.          Assessment & Plan:  CARDIOVASCULAR.  Asymptomatic.  BONE DENSITY.  Given on Femara.  At risk for osteoporosis.  Last mammogram 2013.  Schedule for bone density.    HEALTH MAINTENANCE.  Physical today.  Mammogram 07/17/12 - Birads I  Due f/u mammogram.  Colonoscopy 7/02 - internal hemorrhoids.  Talked with her regarding follow up colonoscopy.  She wants to hold on referral at this point.  Will notify me when agreeable.  IFOB negative 07/19/12.   IFOB given to her today.

## 2013-07-14 NOTE — Assessment & Plan Note (Signed)
She declines medication.  Continue calcium and vitamin D.  Continue weight bearing exercise.  Follow.  Last vitamin D level wnl 01/03/13.

## 2013-07-14 NOTE — Assessment & Plan Note (Signed)
On no medication.  Desires not to take.  Takes red yeast rice.  Follow lipid panel.  LDL increased on this last check.  Discussed with her today.  She wants to continue to monitor.  Follow.  Declines medication.

## 2013-07-23 ENCOUNTER — Other Ambulatory Visit (INDEPENDENT_AMBULATORY_CARE_PROVIDER_SITE_OTHER): Payer: Self-pay | Admitting: General Surgery

## 2013-07-23 ENCOUNTER — Ambulatory Visit
Admission: RE | Admit: 2013-07-23 | Discharge: 2013-07-23 | Disposition: A | Discharge status: Home / Self Care | Payer: Medicare Other | Source: Ambulatory Visit | Attending: General Surgery | Admitting: General Surgery

## 2013-07-23 DIAGNOSIS — Z9012 Acquired absence of left breast and nipple: Secondary | ICD-10-CM

## 2013-07-23 DIAGNOSIS — Z1231 Encounter for screening mammogram for malignant neoplasm of breast: Secondary | ICD-10-CM

## 2013-07-25 ENCOUNTER — Encounter (INDEPENDENT_AMBULATORY_CARE_PROVIDER_SITE_OTHER): Payer: Self-pay | Admitting: General Surgery

## 2013-07-25 ENCOUNTER — Ambulatory Visit (INDEPENDENT_AMBULATORY_CARE_PROVIDER_SITE_OTHER): Payer: Medicare Other | Admitting: General Surgery

## 2013-07-25 VITALS — BP 118/62 | HR 60 | Temp 98.0°F | Resp 18 | Ht 64.0 in | Wt 101.0 lb

## 2013-07-25 DIAGNOSIS — C50419 Malignant neoplasm of upper-outer quadrant of unspecified female breast: Secondary | ICD-10-CM

## 2013-07-25 NOTE — Patient Instructions (Signed)
Examination of your left mastectomy wound, right breast, and all the lymph nodes are normal. There is no evidence of cancer.  Your mammograms have not been read yet, but we will check on that. You should receive a call report on this  Continue to take the letrozole, and keep your regular appointment with Dr.  Humphrey Rolls  Return to see Dr. Dalbert Batman in one year, after your annual mammogram.

## 2013-07-25 NOTE — Progress Notes (Signed)
Patient ID: Cassidy Martinez, female   DOB: 1931-08-10, 80 y.o.   MRN: [id number] History: Cassidy Martinez is a 80 y.o. female. She returns for long-term followup regarding her left breast cancer.  On 08/12/2011 this patient underwent left total mastectomy and sentinel node biopsy. She had a 2.2 cm invasive cancer. 0 of 2 sentinel nodes were positive for a pathologic stage T2, N0 tumor. ER 100%, PR-negative, HER-2-negative, Ki-67 13%. She is on letrozole and is followed by Dr. Humphrey Rolls.  She has no complaints about her right breast and she has no complaints about her left mastectomy wound. Recent right breast mammogram on 07/23/2013 is not read yet.  She has a remote history of a right partial mastectomy for atypical ductal hyperplasia.  Past history, family history, social history, and review of systems are documented on the chart, unchanged, and noncontributory except as described above  Exam: Constitutional: She is oriented to person, place, and time. She appears well-developed and well-nourished. No distress.  HENT:  Head: Normocephalic and atraumatic. Marland Kitchen  Neck: Neck supple. No JVD present. No tracheal deviation present. No thyromegaly present.  Cardiovascular: Normal rate, regular rhythm, normal heart sounds and intact distal pulses.  No murmur heard.  Pulmonary/Chest: Effort normal and breath sounds normal. No respiratory distress. She has no wheezes. She has no rales. She exhibits no tenderness.  Left mastectomy scar well healed. No nodules ulceration or axillary mass. Full range of motion left shoulder. Right breast small, a radial incision at 6:00. No mass or other skin changes. No axillary adenopathy.  Musculoskeletal: She exhibits no edema and no tenderness.  Lymphadenopathy:  She has no cervical adenopathy.  Neurological: She is alert and oriented to person, place, and time. She exhibits normal muscle tone. Coordination normal.  Skin: Skin is warm. No rash noted. She is not diaphoretic. No  erythema. No pallor.  Psychiatric: She has a normal mood and affect. Her behavior is normal. Judgment and thought content normal.   Assessment: Invasive cancer left breast, pathologic stage TII, N0, ER positive, PR-negative, HER-2 negative.  No evidence of recurrence two  years following left total mastectomy and sentinel node biopsy  Remote history right partial mastectomy for ADH   Plan  Continue letrozole and routine followup with Dr. Marcy Panning.  Return to see me in one year after annual right breast mammogram.   Edsel Petrin. Dalbert Batman, M.D., [facility] General and Minimally invasive Surgery Breast and Colorectal Surgery Office:   [phone] Pager:   [phone]

## 2013-08-19 ENCOUNTER — Encounter: Payer: Self-pay | Admitting: Adult Health

## 2013-08-19 ENCOUNTER — Telehealth: Payer: Self-pay | Admitting: Internal Medicine

## 2013-08-19 ENCOUNTER — Ambulatory Visit (INDEPENDENT_AMBULATORY_CARE_PROVIDER_SITE_OTHER): Payer: Medicare Other | Admitting: Adult Health

## 2013-08-19 VITALS — BP 104/60 | HR 77 | Temp 98.7°F | Resp 14 | Wt 99.0 lb

## 2013-08-19 DIAGNOSIS — R059 Cough, unspecified: Secondary | ICD-10-CM | POA: Diagnosis not present

## 2013-08-19 DIAGNOSIS — J029 Acute pharyngitis, unspecified: Secondary | ICD-10-CM

## 2013-08-19 DIAGNOSIS — R05 Cough: Secondary | ICD-10-CM | POA: Diagnosis not present

## 2013-08-19 LAB — POCT RAPID STREP A (OFFICE): Rapid Strep A Screen: NEGATIVE

## 2013-08-19 LAB — POCT INFLUENZA A/B
INFLUENZA A, POC: NEGATIVE
INFLUENZA B, POC: NEGATIVE

## 2013-08-19 MED ORDER — GUAIFENESIN-CODEINE 100-10 MG/5ML PO SOLN: 5.0000 mL | Freq: Three times a day (TID) | ORAL | Status: DC | PRN

## 2013-08-19 MED ORDER — AZITHROMYCIN 250 MG PO TABS: ORAL_TABLET | ORAL | Status: DC

## 2013-08-19 NOTE — Progress Notes (Signed)
Pre visit review using our clinic review tool, if applicable. No additional management support is needed unless otherwise documented below in the visit note. 

## 2013-08-19 NOTE — Patient Instructions (Addendum)
  Start Azithromycin - take 2 tablets today then take 1 tablet daily for the next 4 days.  Robitussin AC for severe cough. This medication has codeine and will cause sedation. Do not drive while taking this medication.  Drink fluids to maintain hydration.  Tylenol for fever or general discomfort.  Salt water gargles for sore throat. You may also use chloraseptic spray or lozenges to help soothe your throat.  Call if symptoms are not improved within 4-5 days or sooner if necessary.

## 2013-08-19 NOTE — Telephone Encounter (Signed)
Pt coming in today @ 3:45 to see Raquel

## 2013-08-19 NOTE — Progress Notes (Signed)
Patient ID: Cassidy Martinez, female   DOB: 24-Nov-1931, 80 y.o.   MRN: [id number]    Subjective:    Patient ID: Cassidy Martinez, female    DOB: [birth year], 80 y.o.   MRN: [id number]  HPI  Pt is a 80 y/o female who presents to clinic with sore throat, chills, HA. Symptoms began on Saturday. She reports that she has not taken anything OTC because she did not know what she should take. Her husband has been sick; although, his symptoms have improved. Denies shortness of breath, wheezing. Cough has been keeping her up at night.   Past Medical History  Diagnosis Date  . Hyperlipidemia   . PONV (postoperative nausea and vomiting)   . Arthritis     hands  . Heart murmur   . History of UTI     "have had a few over the years; haven't had once since ~ 2007"  . Carpal tunnel syndrome, bilateral   . Migraines     "used to have them severe; stopped when I took self off estrogen"  . Breast CA     left;; S/P mastectomy 08/12/11    Current Outpatient Prescriptions on File Prior to Visit  Medication Sig Dispense Refill  . Ascorbic Acid (VITAMIN C PO) Take 600 mg by mouth daily.      . benzonatate (TESSALON) 200 MG capsule Take 1 capsule (200 mg total) by mouth 3 (three) times daily as needed for cough.  30 capsule  0  . CALCIUM PO Take 1,000 mg by mouth daily.      . carboxymethylcellulose 1 % ophthalmic solution Apply 1 drop to eye 2 (two) times daily.      . Cholecalciferol (VITAMIN D-3 PO) Take 1 capsule by mouth 2 (two) times daily.       Marland Kitchen FOLIC ACID PO Take 1 tablet by mouth daily.      Marland Kitchen letrozole (FEMARA) 2.5 MG tablet Take 1 tablet (2.5 mg total) by mouth daily.  90 tablet  12  . NON FORMULARY Take 1 capsule by mouth 4 (four) times daily. Vision Essential      . Red Yeast Rice Extract (RED YEAST RICE PO) Take 1 capsule by mouth 2 (two) times daily.      Marland Kitchen triamcinolone cream (KENALOG) 0.1 % Apply topically 2 (two) times daily.  30 g  0   No current facility-administered medications on file prior  to visit.     Review of Systems  Constitutional: Negative for fever and chills.  HENT: Positive for sore throat.        Ear fullness  Respiratory: Positive for cough.   Gastrointestinal: Negative.   Genitourinary: Negative.   Neurological: Negative for dizziness and light-headedness.  Psychiatric/Behavioral: Negative.   All other systems reviewed and are negative.       Objective:  BP 104/60  Pulse 77  Temp(Src) 98.7 F (37.1 C) (Oral)  Resp 14  Wt 99 lb (44.906 kg)  SpO2 96%   Physical Exam  Constitutional: She is oriented to person, place, and time. She appears well-developed and well-nourished.  Appears acutely ill  HENT:  Head: Normocephalic and atraumatic.  Right Ear: External ear normal.  Left Ear: External ear normal.  Pharyngeal erythema. No exudate.  Cardiovascular: Normal rate, regular rhythm and normal heart sounds.  Exam reveals no gallop.   No murmur heard. Pulmonary/Chest: Effort normal and breath sounds normal. No respiratory distress. She has no wheezes. She has no rales.  Musculoskeletal:  Normal range of motion.  Lymphadenopathy:    She has cervical adenopathy.  Neurological: She is alert and oriented to person, place, and time.  Skin: Skin is warm and dry.  Psychiatric: She has a normal mood and affect. Her behavior is normal. Judgment and thought content normal.       Assessment & Plan:   1. Cough Start Azithromycin. Robitussin AC for severe cough keeping her up at night. RTC if no improvement in 3-4 days. Negative influenza  - POCT Influenza A/B  2. Sore throat Negative for strep. Salt water gargles, chloraseptic lozenges or spray, tylenol for pain, fever or discomfort.  - POCT rapid strep A

## 2013-08-19 NOTE — Telephone Encounter (Signed)
Pt states she has been feeling terrible for 3-4 days.  Has a headache and sore throat.  States she needs to be seen by Dr. Nicki Reaper.

## 2013-08-24 DIAGNOSIS — J069 Acute upper respiratory infection, unspecified: Secondary | ICD-10-CM | POA: Diagnosis not present

## 2013-08-26 ENCOUNTER — Telehealth: Payer: Self-pay | Admitting: Internal Medicine

## 2013-08-26 ENCOUNTER — Encounter: Payer: Self-pay | Admitting: Family Medicine

## 2013-08-26 ENCOUNTER — Ambulatory Visit (INDEPENDENT_AMBULATORY_CARE_PROVIDER_SITE_OTHER): Payer: Medicare Other | Admitting: Family Medicine

## 2013-08-26 VITALS — BP 126/74 | HR 60 | Temp 97.8°F | Wt 97.8 lb

## 2013-08-26 DIAGNOSIS — J209 Acute bronchitis, unspecified: Secondary | ICD-10-CM | POA: Diagnosis not present

## 2013-08-26 NOTE — Patient Instructions (Signed)
I do think you had bronchitis but I think it's slowly improving.  We should see improvement each day. If any fever >101, or worsening cough instead of improving, call me for xray. Continue pushing fluids and rest. May use mucinex 1 pill in the morning with glass of water to help mobilize mucous out. May continue robitussin AC at night time for cough. May continue tessalon perls during the day as needed if bad cough.

## 2013-08-26 NOTE — Progress Notes (Signed)
Pre visit review using our clinic review tool, if applicable. No additional management support is needed unless otherwise documented below in the visit note. 

## 2013-08-26 NOTE — Telephone Encounter (Signed)
Fyi.

## 2013-08-26 NOTE — Telephone Encounter (Signed)
Patient Information:  Caller Name: Cheris  Phone: [phone]  Patient: Cassidy Martinez  Gender: Female  DOB: January 14, 1932  Age: 80 Years  PCP: Einar Pheasant  Office Follow Up:  Does the office need to follow up with this patient?: No  Instructions For The Office: N/A  RN Note:  Seen in office 08/19/13 and given Rx for zpack and Robitussin AC.  States she did not improve, and so went to walk in clinic 08/24/13.  States was told to take Mucinex, and benzonatate. Wants to know if that is safe to take benzonatate.  States she has not improved at all.  Per cough protocol, advised being seen within 72 hours due to failure to improve/cough present > 10 days.  Patient wants to be seen today.  No appts available in [place] office today; appt scheduled per patient request 1530 08/26/13 with Dr. Danise Mina at [facility] office.  krs/can  Symptoms  Reason For Call & Symptoms: medfcation question  Reviewed Health History In EMR: Yes  Reviewed Medications In EMR: Yes  Reviewed Allergies In EMR: Yes  Reviewed Surgeries / Procedures: Yes  Date of Onset of Symptoms: 08/16/2013  Guideline(s) Used:  Cough  Disposition Per Guideline:   See Within 3 Days in Office  Reason For Disposition Reached:   Cough has been present for > 10 days  Advice Given:  N/A  Patient Will Follow Care Advice:  YES  Appointment Scheduled:  08/26/2013 15:30:00 Appointment Scheduled Provider:  N/A

## 2013-08-26 NOTE — Progress Notes (Signed)
BP 126/74  Pulse 60  Temp(Src) 97.8 F (36.6 C) (Oral)  Wt 97 lb 12.8 oz (44.362 kg)  SpO2 98%   CC: cough not better   Subjective:    Patient ID: Cassidy Martinez, female    DOB: 20-Oct-1931, 80 y.o.   MRN: [id number]  HPI: Cassidy Martinez is a 80 y.o. female presenting on 08/26/2013 for Cough   Seen 08/19/2013 by Raquel at [facility].  At that time RST neg and flu swab negative.  Treated with azithromycin and robitussin AC.  Cough not improving. Seen over weekend at walk in clinic where it was suggested she take benzonatate and mucinex. She has finished zpack and had questions about other meds.  sxs started 9 days ago.  Started with severe cough, has progressed to fatigue, chills, some muffled hearing and PNDrainage.  Fatigue and chills improving some.  She has lost 4-5 lbs with this illness.  No fevers, body aches, chest or head congestion, headaches,   Husband sick recently with similar illness. No smokers at home. Flu shot not done this year. No h/o asthma.  Relevant past medical, surgical, family and social history reviewed and updated as indicated.  Allergies and medications reviewed and updated. Current Outpatient Prescriptions on File Prior to Visit  Medication Sig  . Ascorbic Acid (VITAMIN C PO) Take 600 mg by mouth daily.  Marland Kitchen CALCIUM PO Take 1,000 mg by mouth daily.  . carboxymethylcellulose 1 % ophthalmic solution Apply 1 drop to eye 2 (two) times daily.  . Cholecalciferol (VITAMIN D-3 PO) Take 1 capsule by mouth 2 (two) times daily.   Marland Kitchen FOLIC ACID PO Take 1 tablet by mouth daily.  Marland Kitchen letrozole (FEMARA) 2.5 MG tablet Take 1 tablet (2.5 mg total) by mouth daily.  . NON FORMULARY Take 1 capsule by mouth 4 (four) times daily. Vision Essential  . Red Yeast Rice Extract (RED YEAST RICE PO) Take 1 capsule by mouth 2 (two) times daily.  Marland Kitchen guaiFENesin-codeine 100-10 MG/5ML syrup Take 5 mLs by mouth 3 (three) times daily as needed.  . triamcinolone cream (KENALOG) 0.1 % Apply  topically 2 (two) times daily.   No current facility-administered medications on file prior to visit.    Review of Systems Per HPI unless specifically indicated above    Objective:    BP 126/74  Pulse 60  Temp(Src) 97.8 F (36.6 C) (Oral)  Wt 97 lb 12.8 oz (44.362 kg)  SpO2 98%  Physical Exam  Nursing note and vitals reviewed. Constitutional: She appears well-developed and well-nourished. No distress.  HENT:  Head: Normocephalic and atraumatic.  Right Ear: Hearing, tympanic membrane, external ear and ear canal normal.  Left Ear: Hearing, tympanic membrane, external ear and ear canal normal.  Nose: Mucosal edema (mild with nasal congestion) present. No rhinorrhea. Right sinus exhibits no maxillary sinus tenderness and no frontal sinus tenderness. Left sinus exhibits no maxillary sinus tenderness and no frontal sinus tenderness.  Mouth/Throat: Uvula is midline, oropharynx is clear and moist and mucous membranes are normal. No oropharyngeal exudate, posterior oropharyngeal edema, posterior oropharyngeal erythema or tonsillar abscesses.  Eyes: Conjunctivae and EOM are normal. Pupils are equal, round, and reactive to light. No scleral icterus.  Neck: Normal range of motion. Neck supple.  Cardiovascular: Normal rate, regular rhythm, normal heart sounds and intact distal pulses.   No murmur heard. Pulmonary/Chest: Effort normal and breath sounds normal. No respiratory distress. She has no wheezes. She has no rales.  Scattered rhonchi predominantly LLL  Lymphadenopathy:    She has no cervical adenopathy.  Skin: Skin is warm and dry. No rash noted.       Assessment & Plan:   Problem List Items Addressed This Visit   Acute bronchitis - Primary     Anticipate residual of prior bronchitis treated with zpack. Reviewed meds she's on and recommended treatment regimen as outlined in pt instructions. Offered CXR today, pt states she's feeling better today so would like to hold off on cxr,  which is reasonable. Red flags to seek care discussed.   Advised to call us if not improving as expected to obtain CXR and further treatment plan based on results.        Follow up plan: Return if symptoms worsen or fail to improve.

## 2013-08-26 NOTE — Assessment & Plan Note (Signed)
Anticipate residual of prior bronchitis treated with zpack. Reviewed meds she's on and recommended treatment regimen as outlined in pt instructions. Offered CXR today, pt states she's feeling better today so would like to hold off on cxr, which is reasonable. Red flags to seek care discussed.   Advised to call us if not improving as expected to obtain CXR and further treatment plan based on results.

## 2013-08-27 ENCOUNTER — Telehealth: Payer: Self-pay | Admitting: *Deleted

## 2013-08-27 ENCOUNTER — Ambulatory Visit (INDEPENDENT_AMBULATORY_CARE_PROVIDER_SITE_OTHER)
Admission: RE | Admit: 2013-08-27 | Discharge: 2013-08-27 | Disposition: A | Discharge status: Home / Self Care | Payer: Medicare Other | Source: Ambulatory Visit | Attending: Family Medicine | Admitting: Family Medicine

## 2013-08-27 DIAGNOSIS — R059 Cough, unspecified: Secondary | ICD-10-CM

## 2013-08-27 DIAGNOSIS — J209 Acute bronchitis, unspecified: Secondary | ICD-10-CM

## 2013-08-27 DIAGNOSIS — R05 Cough: Secondary | ICD-10-CM

## 2013-08-27 NOTE — Telephone Encounter (Signed)
Patient called and stated that she has decided that she does want a chest x-ray because she is feeling worse today. Patient wants a call back with the time that she needs to come in for the x-ray.

## 2013-08-27 NOTE — Telephone Encounter (Signed)
May come in at anytime today for CXR - order in chart.

## 2013-08-27 NOTE — Telephone Encounter (Signed)
Patient notified and will come now. 

## 2013-08-28 ENCOUNTER — Encounter: Payer: Self-pay | Admitting: Internal Medicine

## 2013-08-28 ENCOUNTER — Ambulatory Visit (INDEPENDENT_AMBULATORY_CARE_PROVIDER_SITE_OTHER): Payer: Medicare Other | Admitting: Internal Medicine

## 2013-08-28 ENCOUNTER — Telehealth: Payer: Self-pay | Admitting: Emergency Medicine

## 2013-08-28 VITALS — BP 130/70 | HR 65 | Temp 97.9°F | Ht 64.0 in | Wt 95.5 lb

## 2013-08-28 DIAGNOSIS — R5383 Other fatigue: Secondary | ICD-10-CM

## 2013-08-28 DIAGNOSIS — R5381 Other malaise: Secondary | ICD-10-CM | POA: Diagnosis not present

## 2013-08-28 DIAGNOSIS — R05 Cough: Secondary | ICD-10-CM | POA: Diagnosis not present

## 2013-08-28 DIAGNOSIS — R059 Cough, unspecified: Secondary | ICD-10-CM | POA: Diagnosis not present

## 2013-08-28 LAB — BASIC METABOLIC PANEL
BUN: 17 mg/dL (ref 6–23)
CALCIUM: 9.3 mg/dL (ref 8.4–10.5)
CHLORIDE: 101 meq/L (ref 96–112)
CO2: 27 mEq/L (ref 19–32)
CREATININE: 1 mg/dL (ref 0.4–1.2)
GFR: 56.73 mL/min — ABNORMAL LOW (ref >60.00)
GLUCOSE: 99 mg/dL (ref 70–99)
Potassium: 3.9 mEq/L (ref 3.5–5.1)
Sodium: 135 mEq/L (ref 135–145)

## 2013-08-28 LAB — CBC WITH DIFFERENTIAL/PLATELET
BASOS ABS: 0 10*3/uL (ref 0.0–0.1)
BASOS PCT: 0.5 % (ref 0.0–3.0)
EOS PCT: 0.8 % (ref 0.0–5.0)
Eosinophils Absolute: 0.1 10*3/uL (ref 0.0–0.7)
HEMATOCRIT: 45 % (ref 36.0–46.0)
HEMOGLOBIN: 14.9 g/dL (ref 12.0–15.0)
LYMPHS ABS: 1.8 10*3/uL (ref 0.7–4.0)
LYMPHS PCT: 22.1 % (ref 12.0–46.0)
MCHC: 33.1 g/dL (ref 30.0–36.0)
MCV: 90.1 fl (ref 78.0–100.0)
MONOS PCT: 6.7 % (ref 3.0–12.0)
Monocytes Absolute: 0.6 10*3/uL (ref 0.1–1.0)
Neutro Abs: 5.8 10*3/uL (ref 1.4–7.7)
Neutrophils Relative %: 69.9 % (ref 43.0–77.0)
Platelets: 255 10*3/uL (ref 150.0–400.0)
RBC: 5 Mil/uL (ref 3.87–5.11)
RDW: 13.8 % (ref 11.5–14.6)
WBC: 8.4 10*3/uL (ref 4.5–10.5)

## 2013-08-28 NOTE — Progress Notes (Signed)
Pre-visit discussion using our clinic review tool. No additional management support is needed unless otherwise documented below in the visit note.  

## 2013-08-28 NOTE — Telephone Encounter (Signed)
See if she can come in at 12:45 today.

## 2013-08-28 NOTE — Telephone Encounter (Signed)
Two weeks ago, pt started with cough, chills, sore throat- patient was seen by Raquel, then she went to see [facility] on Saturday, and then pt seen at [facility] yesterday. Pt states she is feeling pretty weak today and 100x worse today. Patient would like to be seen today. Pt states she has last 6 lbs. Please advise.

## 2013-08-28 NOTE — Patient Instructions (Signed)
Robitussin (plain)

## 2013-08-28 NOTE — Telephone Encounter (Signed)
Pt notified, will be seen at 12:45 today.

## 2013-08-29 ENCOUNTER — Telehealth: Payer: Self-pay | Admitting: Internal Medicine

## 2013-08-29 ENCOUNTER — Encounter: Payer: Self-pay | Admitting: *Deleted

## 2013-08-29 NOTE — Telephone Encounter (Signed)
Pt.notified

## 2013-08-29 NOTE — Telephone Encounter (Signed)
Agree with clear fluids, bland foods advance diet slowly.  She had no pain yesterday and felt better.  If increasing abdominal pain or other problems, will have to be reevaluated.  Stick with bland foods and fluids.  Labs ok.

## 2013-08-29 NOTE — Telephone Encounter (Signed)
Please advise 

## 2013-08-29 NOTE — Telephone Encounter (Signed)
Patient Information:  Caller Name: Caliope  Phone: [phone]  Patient: Cassidy Martinez, Cassidy Martinez  Gender: Female  DOB: Nov 22, 1931  Age: 80 Years  PCP: Einar Pheasant  Office Follow Up:  Does the office need to follow up with this patient?: Yes  Instructions For The Office: Please reviewe and advise .  Patient is concerned cough is returning and Gastric upset.  PLEASE REVIEW AND CONTACT PATIENT.  RN Note:  Please reviewe and advise .  Patient is concerned cough is returning and Gastric upset.  PLEASE REVIEW AND CONTACT PATIENT.  Symptoms  Reason For Call & Symptoms: Patient states she was seen in the office yesterday for recent illness.. She states sick for 3 weeks. She felt like she had the flu. Afebrile. +cough non productive,   She had been on Tessalon pearls for cough. She stopped cough syrup, stopped mucinex and Tessalon pearls and put her on OTC Robitussin.  Patient stats she "I feel so bad:"   stomach is 'very uncomfortable more on left side" Liquid light brown.  +nause but no vomiting but the coughing has now returned.. Chills but no fever.  Reviewed Health History In EMR: Yes  Reviewed Medications In EMR: Yes  Reviewed Allergies In EMR: Yes  Reviewed Surgeries / Procedures: Yes  Date of Onset of Symptoms: 08/28/2013  Treatments Tried: Robitussin x 2 doses  Treatments Tried Worked: No  Guideline(s) Used:  Cough  Abdominal Pain - Female  Disposition Per Guideline:   See Today in Office  Reason For Disposition Reached:   Age > 60 years  Advice Given:  Rest:  Lie down and rest until you feel better.  Fluids:  Sip clear fluids only (e.g., water, flat soft drinks or 1/2 strength fruit juice) until the pain has been gone for over 2 hours. Then slowly return to a regular diet.  Diet:  Slowly advance diet from clear liquids to a bland diet  Avoid alcohol or caffeinated beverages  Avoid greasy or fatty foods.  Call Back If:  Abdominal pain is constant and present for more than 2  hours  You become worse.  RN Overrode Recommendation:  Document Patient  Please reviewe and advise .  Patient is concerned cough is returning and Gastric upset.  PLEASE REVIEW AND CONTACT PATIENT.

## 2013-08-30 ENCOUNTER — Encounter: Payer: Self-pay | Admitting: Internal Medicine

## 2013-08-30 NOTE — Progress Notes (Signed)
Subjective:    Patient ID: Cassidy Martinez, female    DOB: [birth year], 80 y.o.   MRN: [id number]  Cough  80 year old female with past history of breast cancer followed by Dr Chancy Milroy, reoccurring allergy and sinus problems and hypercholesterolemia who comes in today as a work in with concerns regarding persistent cough and not feeling well.  She saw Raquel on 08/19/13.  Had sore throat, chills and headache.  Was given a Zpak and cheratussin.  Started feeling some better initially, but after a week - went to [facility].  Tessalon Perles given.  Persistent symptoms (cough) - evaluated at [facility] a few days ago.  Refer to that note for details.  CXR - negative.  She called in today stating that she did not feel well.  Per our discussion today, she states her cough is better.  Congestion better.  She is not feeling as bad.  Eating some, but appetite not back to normal.  Some decreased energy.   Bowels stable.     Past Medical History  Diagnosis Date  . Hyperlipidemia   . PONV (postoperative nausea and vomiting)   . Arthritis     hands  . Heart murmur   . History of UTI     "have had a few over the years; haven't had once since ~ 2007"  . Carpal tunnel syndrome, bilateral   . Migraines     "used to have them severe; stopped when I took self off estrogen"  . Breast CA     left;; S/P mastectomy 08/12/11    Current Outpatient Prescriptions on File Prior to Visit  Medication Sig Dispense Refill  . Ascorbic Acid (VITAMIN C PO) Take 600 mg by mouth daily.      Marland Kitchen CALCIUM PO Take 1,000 mg by mouth daily.      . carboxymethylcellulose 1 % ophthalmic solution Apply 1 drop to eye 2 (two) times daily.      . Cholecalciferol (VITAMIN D-3 PO) Take 1 capsule by mouth 2 (two) times daily.       Marland Kitchen FOLIC ACID PO Take 1 tablet by mouth daily.      Marland Kitchen letrozole (FEMARA) 2.5 MG tablet Take 1 tablet (2.5 mg total) by mouth daily.  90 tablet  12  . NON FORMULARY Take 1 capsule by mouth 4 (four) times daily. Vision  Essential      . Red Yeast Rice Extract (RED YEAST RICE PO) Take 1 capsule by mouth 2 (two) times daily.      Marland Kitchen triamcinolone cream (KENALOG) 0.1 % Apply topically 2 (two) times daily.  30 g  0  . guaiFENesin-codeine 100-10 MG/5ML syrup Take 5 mLs by mouth 3 (three) times daily as needed.  120 mL  0   No current facility-administered medications on file prior to visit.    Review of Systems  Respiratory: Positive for cough.   Patient denies any headache, lightheadedness or dizziness.  No sinus pressure and head fullness.  Some minimal nasal congestion.  Improved.   No chest pain, tightness or palpitations.  No increased shortness of breath.  Some persistent cough, but states improved.   No nausea or vomiting.  No acid reflux.  Some decreased appetite.  No abdominal pain or cramping.  No bowel change, such as diarrhea.       Objective:   Physical Exam  Filed Vitals:   08/28/13 1243  BP: 130/70  Pulse: 65  Temp: 97.9 F (36.6 C)  80 year old female in no acute distress.   HEENT:  Nares- clear.  Oropharynx - without lesions. NECK:  Supple.  Nontender.   HEART:  Appears to be regular. LUNGS:  No crackles or wheezing audible.  Respirations even and unlabored.  Increased air movement.  Good breath sounds bilaterally.   RADIAL PULSE:  Equal bilaterally.  ABDOMEN:  Soft, nontender.  Bowel sounds present and normal.  No audible abdominal bruit.         Assessment & Plan:  CARDIOVASCULAR.  Asymptomatic.

## 2013-08-30 NOTE — Assessment & Plan Note (Signed)
Persistent.  Per her report better.  Had to stop [employer]  Made her hyper.  She has not taken Cheratussin in a few days.  Given cough and congestion better, would continue to hold cheratussin.  Can use Robitussin if needed.  Rest.  Fluids.  Encourage increased po intake.  Follow closely.  Given her fatigue and recent persistent sickness, will check cbc and metabolic panel - to confirm normal.  Energy should gradually improve.  Follow.

## 2013-09-02 ENCOUNTER — Telehealth: Payer: Self-pay | Admitting: Internal Medicine

## 2013-09-02 DIAGNOSIS — R059 Cough, unspecified: Secondary | ICD-10-CM | POA: Diagnosis not present

## 2013-09-02 DIAGNOSIS — R5381 Other malaise: Secondary | ICD-10-CM | POA: Diagnosis not present

## 2013-09-02 DIAGNOSIS — R05 Cough: Secondary | ICD-10-CM | POA: Diagnosis not present

## 2013-09-02 NOTE — Telephone Encounter (Signed)
I tried to reach patient again, no answer. I left a detailed message to see if patient was planning on going to the ER. I asked pt to return my call with an update on where she went.

## 2013-09-02 NOTE — Telephone Encounter (Signed)
Noted. Thanks.

## 2013-09-02 NOTE — Telephone Encounter (Signed)
I tried to reach patient a few times & line is busy

## 2013-09-02 NOTE — Telephone Encounter (Signed)
Have her restart the mucinex.  This will help to thin the mucus.  Bland foods.  I can see her tomorrow at 12:00 if she would like for me to reevaluate.  If acute worsening symptoms, evaluate today.

## 2013-09-02 NOTE — Telephone Encounter (Signed)
Noted.  Please call later and confirm went to ER.  Thanks.

## 2013-09-02 NOTE — Telephone Encounter (Signed)
Patient Information:  Caller Name: [name]  Phone: [phone]  Patient: Cassidy Martinez, Cassidy Martinez  Gender: Female  DOB: December 29, 1931  Age: 80 Years  PCP: Einar Pheasant  Office Follow Up:  Does the office need to follow up with this patient?: No  Instructions For The Office: N/A  RN Note:  Patient states mild cough, Heart rate feels faster than normal, breathing is weaker than normal and her color today is pale. Patient is able to speak in sentences, no wheezing.  When asking if she is having severe difficulty breathing she states yes and that she feels for her she is struggling to get good air exchange. I clarified the question a few times and patient still states yes to severe difficulty breathing.  Instructed 911 and patient declines, explained the risk and patient still declines and states that she will have her husband take her and that it is close by.  Symptoms  Reason For Call & Symptoms: Difficulty Breathing  Reviewed Health History In EMR: Yes  Reviewed Medications In EMR: Yes  Reviewed Allergies In EMR: Yes  Reviewed Surgeries / Procedures: Yes  Date of Onset of Symptoms: 09/01/2013  Treatments Tried: Mouth breathing  Treatments Tried Worked: No  Guideline(s) Used:  Breathing Difficulty  Disposition Per Guideline:   Call EMS 911 Now  Reason For Disposition Reached:   Severe difficulty breathing (e.g., struggling for each breath, speaks in single words, pulse > 120)  Advice Given:  N/A  Patient Refused Recommendation:  Patient Will Go To ED  Patient declines 911 advice and will have husband take to the emergency room.

## 2013-09-02 NOTE — Telephone Encounter (Signed)
Pt returned my call. She did go to the ER (will request records on 09/03/13) & was Rx'd Bromfed. Pt states that she will call us back in a few days with an update if sx's are not resolved or improving.

## 2013-09-02 NOTE — Telephone Encounter (Signed)
Pt called back tosee if dr scott could see her today.   i saw phone note and told her dr Nicki Reaper could see her tomorrow. She stated she was afraid to wait until tomorrow.  i transferred her to triage. i didn't give her any more information from phone note but that dr Nicki Reaper could see her tomorrow

## 2013-09-02 NOTE — Telephone Encounter (Signed)
Pt states she is still having trouble breathing over the last couple of days.  Says she has to keep her mouth open to breathe.  Mucus has thickened up and stomach is queasy.  Is only eating very small amounts of food.  States she was here last week and is asking if anything else can be done for her.

## 2013-09-04 NOTE — Telephone Encounter (Signed)
Faxed received from [facility] recent ER visit was found

## 2013-09-05 ENCOUNTER — Other Ambulatory Visit (INDEPENDENT_AMBULATORY_CARE_PROVIDER_SITE_OTHER): Payer: Medicare Other

## 2013-09-05 DIAGNOSIS — Z1211 Encounter for screening for malignant neoplasm of colon: Secondary | ICD-10-CM

## 2013-09-05 LAB — FECAL OCCULT BLOOD, IMMUNOCHEMICAL: Fecal Occult Bld: NEGATIVE

## 2013-09-06 ENCOUNTER — Encounter: Payer: Self-pay | Admitting: *Deleted

## 2013-09-18 DIAGNOSIS — H259 Unspecified age-related cataract: Secondary | ICD-10-CM | POA: Diagnosis not present

## 2013-09-18 DIAGNOSIS — H43819 Vitreous degeneration, unspecified eye: Secondary | ICD-10-CM | POA: Diagnosis not present

## 2013-09-18 DIAGNOSIS — H35369 Drusen (degenerative) of macula, unspecified eye: Secondary | ICD-10-CM | POA: Insufficient documentation

## 2013-09-18 DIAGNOSIS — Z8669 Personal history of other diseases of the nervous system and sense organs: Secondary | ICD-10-CM | POA: Insufficient documentation

## 2013-09-18 DIAGNOSIS — H3530 Unspecified macular degeneration: Secondary | ICD-10-CM | POA: Diagnosis not present

## 2013-09-18 DIAGNOSIS — H353190 Nonexudative age-related macular degeneration, unspecified eye, stage unspecified: Secondary | ICD-10-CM | POA: Insufficient documentation

## 2013-10-23 DIAGNOSIS — Z8669 Personal history of other diseases of the nervous system and sense organs: Secondary | ICD-10-CM | POA: Diagnosis not present

## 2013-10-23 DIAGNOSIS — H35369 Drusen (degenerative) of macula, unspecified eye: Secondary | ICD-10-CM | POA: Diagnosis not present

## 2013-10-23 DIAGNOSIS — H353190 Nonexudative age-related macular degeneration, unspecified eye, stage unspecified: Secondary | ICD-10-CM | POA: Diagnosis not present

## 2013-10-23 DIAGNOSIS — H259 Unspecified age-related cataract: Secondary | ICD-10-CM | POA: Diagnosis not present

## 2013-10-23 DIAGNOSIS — H353290 Exudative age-related macular degeneration, unspecified eye, stage unspecified: Secondary | ICD-10-CM | POA: Diagnosis not present

## 2013-10-23 DIAGNOSIS — H43819 Vitreous degeneration, unspecified eye: Secondary | ICD-10-CM | POA: Diagnosis not present

## 2013-11-27 DIAGNOSIS — H35369 Drusen (degenerative) of macula, unspecified eye: Secondary | ICD-10-CM | POA: Diagnosis not present

## 2013-11-27 DIAGNOSIS — H353190 Nonexudative age-related macular degeneration, unspecified eye, stage unspecified: Secondary | ICD-10-CM | POA: Diagnosis not present

## 2013-11-27 DIAGNOSIS — Z8669 Personal history of other diseases of the nervous system and sense organs: Secondary | ICD-10-CM | POA: Diagnosis not present

## 2013-11-27 DIAGNOSIS — H353290 Exudative age-related macular degeneration, unspecified eye, stage unspecified: Secondary | ICD-10-CM | POA: Diagnosis not present

## 2014-01-07 ENCOUNTER — Other Ambulatory Visit (INDEPENDENT_AMBULATORY_CARE_PROVIDER_SITE_OTHER): Payer: Medicare Other

## 2014-01-07 DIAGNOSIS — R945 Abnormal results of liver function studies: Secondary | ICD-10-CM

## 2014-01-07 DIAGNOSIS — K769 Liver disease, unspecified: Secondary | ICD-10-CM | POA: Diagnosis not present

## 2014-01-07 DIAGNOSIS — E7800 Pure hypercholesterolemia, unspecified: Secondary | ICD-10-CM | POA: Diagnosis not present

## 2014-01-07 LAB — COMPREHENSIVE METABOLIC PANEL
ALBUMIN: 3.8 g/dL (ref 3.5–5.2)
ALK PHOS: 75 U/L (ref 39–117)
ALT: 22 U/L (ref 0–35)
AST: 50 U/L — AB (ref 0–37)
BUN: 19 mg/dL (ref 6–23)
CO2: 29 mEq/L (ref 19–32)
Calcium: 9.5 mg/dL (ref 8.4–10.5)
Chloride: 105 mEq/L (ref 96–112)
Creatinine, Ser: 1 mg/dL (ref 0.4–1.2)
GFR: 56.68 mL/min — ABNORMAL LOW (ref >60.00)
Glucose, Bld: 89 mg/dL (ref 70–99)
POTASSIUM: 4 meq/L (ref 3.5–5.1)
SODIUM: 139 meq/L (ref 135–145)
TOTAL PROTEIN: 6.5 g/dL (ref 6.0–8.3)
Total Bilirubin: 0.8 mg/dL (ref 0.2–1.2)

## 2014-01-07 LAB — LIPID PANEL
CHOL/HDL RATIO: 3
CHOLESTEROL: 231 mg/dL — AB (ref 0–200)
HDL: 80.9 mg/dL (ref >39.00)
LDL Cholesterol: 134 mg/dL — ABNORMAL HIGH (ref 0–99)
NONHDL: 150.1
Triglycerides: 81 mg/dL (ref 0.0–149.0)
VLDL: 16.2 mg/dL (ref 0.0–40.0)

## 2014-01-08 DIAGNOSIS — H353190 Nonexudative age-related macular degeneration, unspecified eye, stage unspecified: Secondary | ICD-10-CM | POA: Diagnosis not present

## 2014-01-08 DIAGNOSIS — H259 Unspecified age-related cataract: Secondary | ICD-10-CM | POA: Diagnosis not present

## 2014-01-08 DIAGNOSIS — H43819 Vitreous degeneration, unspecified eye: Secondary | ICD-10-CM | POA: Diagnosis not present

## 2014-01-08 DIAGNOSIS — H353290 Exudative age-related macular degeneration, unspecified eye, stage unspecified: Secondary | ICD-10-CM | POA: Diagnosis not present

## 2014-01-08 DIAGNOSIS — H35369 Drusen (degenerative) of macula, unspecified eye: Secondary | ICD-10-CM | POA: Diagnosis not present

## 2014-01-09 ENCOUNTER — Encounter: Payer: Self-pay | Admitting: Internal Medicine

## 2014-01-09 ENCOUNTER — Ambulatory Visit (INDEPENDENT_AMBULATORY_CARE_PROVIDER_SITE_OTHER): Payer: Medicare Other | Admitting: Internal Medicine

## 2014-01-09 VITALS — BP 122/70 | HR 63 | Temp 97.9°F | Ht 64.0 in | Wt 96.2 lb

## 2014-01-09 DIAGNOSIS — R221 Localized swelling, mass and lump, neck: Secondary | ICD-10-CM

## 2014-01-09 DIAGNOSIS — Z1211 Encounter for screening for malignant neoplasm of colon: Secondary | ICD-10-CM

## 2014-01-09 DIAGNOSIS — M810 Age-related osteoporosis without current pathological fracture: Secondary | ICD-10-CM

## 2014-01-09 DIAGNOSIS — R5383 Other fatigue: Secondary | ICD-10-CM

## 2014-01-09 DIAGNOSIS — R220 Localized swelling, mass and lump, head: Secondary | ICD-10-CM

## 2014-01-09 DIAGNOSIS — C50419 Malignant neoplasm of upper-outer quadrant of unspecified female breast: Secondary | ICD-10-CM | POA: Diagnosis not present

## 2014-01-09 DIAGNOSIS — E7800 Pure hypercholesterolemia, unspecified: Secondary | ICD-10-CM

## 2014-01-09 DIAGNOSIS — R945 Abnormal results of liver function studies: Secondary | ICD-10-CM

## 2014-01-09 DIAGNOSIS — K769 Liver disease, unspecified: Secondary | ICD-10-CM

## 2014-01-09 DIAGNOSIS — R5381 Other malaise: Secondary | ICD-10-CM

## 2014-01-09 NOTE — Progress Notes (Signed)
Pre visit review using our clinic review tool, if applicable. No additional management support is needed unless otherwise documented below in the visit note. 

## 2014-01-12 ENCOUNTER — Encounter: Payer: Self-pay | Admitting: Internal Medicine

## 2014-01-12 DIAGNOSIS — R221 Localized swelling, mass and lump, neck: Secondary | ICD-10-CM | POA: Insufficient documentation

## 2014-01-12 NOTE — Assessment & Plan Note (Signed)
Worked up and evaluated by Dr Tiffany Kocher.  He recommended to continue to follow liver panel   Last liver panel revealed stable.   Follow.

## 2014-01-12 NOTE — Assessment & Plan Note (Signed)
Left neck fullness.  No evidence if infection.  Refer to ENT for evaluation.

## 2014-01-12 NOTE — Assessment & Plan Note (Signed)
Has been followed by Dr Chancy Milroy.  Stable.  Last mammogram 07/31/13 - Birads I.

## 2014-01-12 NOTE — Assessment & Plan Note (Addendum)
On no medication.  Desires not to take.  Takes red yeast rice.  Follow lipid panel.  Most recent check 01/07/14 revealed total cholesterol 231, triglycerides 81, HDL 81 and LDL 134.  Improved.

## 2014-01-12 NOTE — Progress Notes (Signed)
Subjective:    Patient ID: Cassidy Martinez, female    DOB: Feb 20, 1932, 80 y.o.   MRN: [id number]  HPI 80 year old female with past history of breast cancer followed by Dr Chancy Milroy, reoccurring allergy and sinus problems and hypercholesterolemia who comes in today for a scheduled follow up.  Seeing oncology.  Was followed by Dr Chancy Milroy.  She has moved.  She is going to call and find out who she is scheduled to follow up with now.  Doing well.  On femara and tolerating.  States she is up to date with mammograms.  Eating and drinking well.  No nausea or vomiting.  Bowels stable.  Seeing Dr Tomi Likens for her eyes.  Being followed for macular degeneration.  Had f/u with Dr Dalbert Batman in 2/15.     Past Medical History  Diagnosis Date  . Hyperlipidemia   . PONV (postoperative nausea and vomiting)   . Arthritis     hands  . Heart murmur   . History of UTI     "have had a few over the years; haven't had once since ~ 2007"  . Carpal tunnel syndrome, bilateral   . Migraines     "used to have them severe; stopped when I took self off estrogen"  . Breast CA     left;; S/P mastectomy 08/12/11    Current Outpatient Prescriptions on File Prior to Visit  Medication Sig Dispense Refill  . Ascorbic Acid (VITAMIN C PO) Take 600 mg by mouth daily.      Marland Kitchen CALCIUM PO Take 1,000 mg by mouth daily.      . carboxymethylcellulose 1 % ophthalmic solution Apply 1 drop to eye 2 (two) times daily.      . Cholecalciferol (VITAMIN D-3 PO) Take 1 capsule by mouth 2 (two) times daily.       Marland Kitchen FOLIC ACID PO Take 1 tablet by mouth daily.      Marland Kitchen guaiFENesin (MUCINEX) 600 MG 12 hr tablet Take by mouth 2 (two) times daily.      Marland Kitchen guaiFENesin-codeine 100-10 MG/5ML syrup Take 5 mLs by mouth 3 (three) times daily as needed.  120 mL  0  . letrozole (FEMARA) 2.5 MG tablet Take 1 tablet (2.5 mg total) by mouth daily.  90 tablet  12  . NON FORMULARY Take 1 capsule by mouth 4 (four) times daily. Vision Essential      . Red Yeast Rice Extract (RED  YEAST RICE PO) Take 1 capsule by mouth 2 (two) times daily.      Marland Kitchen triamcinolone cream (KENALOG) 0.1 % Apply topically 2 (two) times daily.  30 g  0   No current facility-administered medications on file prior to visit.    Review of Systems Patient denies any headache, lightheadedness or dizziness.  No significant sinus or allergy symptoms.  No chest pain, tightness or palpitations.  No increased shortness of breath, cough or congestion.  No nausea or vomiting.  No acid reflux.  No abdominal pain or cramping.  No bowel change, such as diarrhea, constipation, BRBPR or melana.  No urine change.  No vaginal problems.  Tolerating the femara.       Objective:   Physical Exam  Filed Vitals:   01/09/14 0801  BP: 122/70  Pulse: 63  Temp: 97.9 F (48.51 C)   80 year old female in no acute distress.   HEENT:  Nares- clear.  Oropharynx - without lesions. NECK:  Supple.  Nontender.  No audible  bruit.  Left neck fullness.  Non tender.   HEART:  Appears to be regular. LUNGS:  No crackles or wheezing audible.  Respirations even and unlabored.  RADIAL PULSE:  Equal bilaterally.  ABDOMEN:  Soft, nontender.  Bowel sounds present and normal.  No audible abdominal bruit.     EXTREMITIES:  No increased edema present.  DP pulses palpable and equal bilaterally.          Assessment & Plan:  CARDIOVASCULAR.  Asymptomatic.  BONE DENSITY.  Given on Femara.  At risk for osteoporosis.  Last mammogram 2013.  Was scheduled for follow up bone density.  Obtain results (if had).     HEALTH MAINTENANCE.  Physical 07/11/13.  Mammogram 07/31/13 - Birads I.  Colonoscopy 7/02 - internal hemorrhoids.  Have talked with her regarding follow up colonoscopy.  She has wanted to hold on referral previously.  Agreeable today.    I spent 25 minutes with the patient and more than 50% of the time was spent in consultation regarding the above.

## 2014-01-12 NOTE — Assessment & Plan Note (Signed)
She declines medication.  Continue calcium and vitamin D.  Continue weight bearing exercise.  Follow.

## 2014-01-23 ENCOUNTER — Telehealth: Payer: Self-pay | Admitting: Hematology and Oncology

## 2014-01-31 DIAGNOSIS — R220 Localized swelling, mass and lump, head: Secondary | ICD-10-CM | POA: Diagnosis not present

## 2014-01-31 DIAGNOSIS — R221 Localized swelling, mass and lump, neck: Secondary | ICD-10-CM | POA: Diagnosis not present

## 2014-02-11 ENCOUNTER — Ambulatory Visit (HOSPITAL_BASED_OUTPATIENT_CLINIC_OR_DEPARTMENT_OTHER): Payer: Medicare Other | Admitting: Hematology and Oncology

## 2014-02-11 ENCOUNTER — Encounter: Payer: Self-pay | Admitting: Internal Medicine

## 2014-02-11 ENCOUNTER — Other Ambulatory Visit: Payer: Self-pay | Admitting: Hematology and Oncology

## 2014-02-11 ENCOUNTER — Encounter: Payer: Self-pay | Admitting: *Deleted

## 2014-02-11 ENCOUNTER — Telehealth: Payer: Self-pay | Admitting: Internal Medicine

## 2014-02-11 ENCOUNTER — Telehealth: Payer: Self-pay | Admitting: Hematology and Oncology

## 2014-02-11 ENCOUNTER — Encounter: Payer: Self-pay | Admitting: Hematology and Oncology

## 2014-02-11 VITALS — BP 131/45 | HR 51 | Temp 97.7°F | Resp 18 | Ht 64.0 in | Wt 97.4 lb

## 2014-02-11 DIAGNOSIS — Z1231 Encounter for screening mammogram for malignant neoplasm of breast: Secondary | ICD-10-CM

## 2014-02-11 DIAGNOSIS — C50419 Malignant neoplasm of upper-outer quadrant of unspecified female breast: Secondary | ICD-10-CM | POA: Diagnosis not present

## 2014-02-11 DIAGNOSIS — Z170 Estrogen receptor positive status [ER+]: Secondary | ICD-10-CM

## 2014-02-11 DIAGNOSIS — C50411 Malignant neoplasm of upper-outer quadrant of right female breast: Secondary | ICD-10-CM

## 2014-02-11 DIAGNOSIS — C50412 Malignant neoplasm of upper-outer quadrant of left female breast: Secondary | ICD-10-CM

## 2014-02-11 NOTE — Telephone Encounter (Signed)
Please notify pt that GI has sent [place] a letter stating they have been trying to contact her to schedule a colonoscopy.  They have been unable to reach her.

## 2014-02-11 NOTE — Telephone Encounter (Signed)
Letter mailed

## 2014-02-11 NOTE — Assessment & Plan Note (Addendum)
Stage II a T2, N0, M0 ER positive PR negative HER-2 negative Ki-67 13% status post mastectomy on the left breast 08/12/2011 currently on letrozole 2.5 mg daily. Tolerating it very well without any major problems or concerns. Her primary care physician ordered a bone density test which is pending currently. She is currently on calcium and vitamin D. Breast exam does not reveal any palpable abnormalities.  Return to clinic in February after undergoing mammograms on the right breast. I ordered today.

## 2014-02-11 NOTE — Telephone Encounter (Signed)
, °

## 2014-02-11 NOTE — Progress Notes (Signed)
Patient Care Team: Einar Pheasant, MD as PCP - General (Unknown Physician Specialty) Deatra Robinson, MD (Hematology and Oncology)  DIAGNOSIS: Cancer of upper-outer quadrant of female breast   Primary site: Breast (Left)   Staging method: AJCC 7th Edition   Pathologic: Stage IIA (T2, N0, cM0) signed by Rulon Eisenmenger, MD on 02/11/2014  8:37 AM   Summary: Stage IIA (T2, N0, cM0)   SUMMARY OF ONCOLOGIC HISTORY:   Cancer of upper-outer quadrant of female breast   08/12/2011 Surgery Left mastectomy 2.2 cm tumor ER 100% positive PR negative HER-2 negative Ki-67 13% . (Prior History of partial right mastectomy for atypical ductal hyperplasia October 2010)   09/01/2011 -  Anti-estrogen oral therapy  letrozole 2.5 mg daily    CHIEF COMPLIANT: Six-month followup with history of breast cancer  INTERVAL HISTORY: Cassidy Martinez is 80 year old Caucasian lady with above-mentioned history of breast cancer she had mastectomy and has been on letrozole therapy since March of 2013. She is tolerating it very well without any problems or concerns. She denies any hot flashes. She gets mammograms every February on the right breast. She denies any lumps or nodules. 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 She would like to continue this as long as she can.   REVIEW OF SYSTEMS:   Constitutional: Denies fevers, chills or abnormal weight loss Eyes: Denies blurriness of vision Ears, nose, mouth, throat, and face: Denies mucositis or sore throat Respiratory: Denies cough, dyspnea or wheezes Cardiovascular: Denies palpitation, chest discomfort or lower extremity swelling Gastrointestinal:  Denies nausea, heartburn or change in bowel habits Skin: Denies abnormal skin rashes Lymphatics: Denies new lymphadenopathy or easy bruising Neurological:Denies numbness, tingling or new weaknesses Behavioral/Psych: Mood is stable, no new changes  Breast:  denies any pain or lumps or nodules in  either breasts All other systems were reviewed with the patient and are negative.  I have reviewed the past medical history, past surgical history, social history and family history with the patient and they are unchanged from previous note.  ALLERGIES:  is allergic to sulfa antibiotics; macrobid; and penicillins.  MEDICATIONS:  Current Outpatient Prescriptions  Medication Sig Dispense Refill  . Ascorbic Acid (VITAMIN C PO) Take 600 mg by mouth daily.      Marland Kitchen CALCIUM PO Take 1,000 mg by mouth daily.      . carboxymethylcellulose 1 % ophthalmic solution Apply 1 drop to eye 2 (two) times daily.      Marland Kitchen letrozole (FEMARA) 2.5 MG tablet Take 1 tablet (2.5 mg total) by mouth daily.  90 tablet  12  . NON FORMULARY Take 1 capsule by mouth 4 (four) times daily. Vision Essential      . Cholecalciferol (VITAMIN D-3 PO) Take 1 capsule by mouth 2 (two) times daily.       Marland Kitchen FOLIC ACID PO Take 1 tablet by mouth daily.      Marland Kitchen guaiFENesin (MUCINEX) 600 MG 12 hr tablet Take by mouth 2 (two) times daily.      Marland Kitchen guaiFENesin-codeine 100-10 MG/5ML syrup Take 5 mLs by mouth 3 (three) times daily as needed.  120 mL  0   No current facility-administered medications for this visit.    PHYSICAL EXAMINATION: ECOG PERFORMANCE STATUS: 0 - Asymptomatic  Filed Vitals:   02/11/14 0847  BP: 131/45  Pulse: 51  Temp: 97.7 F (36.5 C)  Resp: 18   Filed Weights   02/11/14 0847  Weight: 97 lb 6.4 oz (44.18 kg)    GENERAL:alert, no distress and comfortable SKIN: skin color, texture, turgor are normal, no rashes or significant lesions EYES: normal, Conjunctiva are pink and non-injected, sclera clear OROPHARYNX:no exudate, no erythema and lips, buccal mucosa, and tongue normal  NECK: supple, thyroid normal size, non-tender, without nodularity LYMPH:  no palpable lymphadenopathy in the cervical, axillary or inguinal LUNGS: clear to auscultation and percussion with normal breathing effort HEART: regular rate &  rhythm and no murmurs and no lower extremity edema ABDOMEN:abdomen soft, non-tender and normal bowel sounds Musculoskeletal:no cyanosis of digits and no clubbing  NEURO: alert & oriented x 3 with fluent speech, no focal motor/sensory deficits BREAST: No palpable masses lungs or nodules in right breast left breast scar appears to be normal without any nodularity or axillary nodes. No palpable axillary supraclavicular or infraclavicular adenopathy no breast tenderness or nipple discharge.   LABORATORY DATA:  I have reviewed the data as listed   Chemistry      Component Value Date/Time   NA 139 01/07/2014 0820   NA 141 07/04/2013 0953   K 4.0 01/07/2014 0820   K 4.5 07/04/2013 0953   CL 105 01/07/2014 0820   CL 103 07/05/2012 0913   CO2 29 01/07/2014 0820   CO2 28 07/04/2013 0953   BUN 19 01/07/2014 0820   BUN 24.1 07/04/2013 0953   CREATININE 1.0 01/07/2014 0820   CREATININE 0.9 07/04/2013 0953      Component Value Date/Time   CALCIUM 9.5 01/07/2014 0820   CALCIUM 9.9 07/04/2013 0953   ALKPHOS 75 01/07/2014 0820   ALKPHOS 66 07/04/2013 0953   AST 50* 01/07/2014 0820   AST 39* 07/04/2013 0953   ALT 22 01/07/2014 0820   ALT 15 07/04/2013 0953   BILITOT 0.8 01/07/2014 0820   BILITOT 0.52 07/04/2013 0953       Lab Results  Component Value Date   WBC 8.4 08/28/2013   HGB 14.9 08/28/2013   HCT 45.0 08/28/2013   MCV 90.1 08/28/2013   PLT 255.0 08/28/2013   NEUTROABS 5.8 08/28/2013     RADIOGRAPHIC STUDIES: I have personally reviewed the radiology reports and agreed with their findings. No results found.   ASSESSMENT & PLAN:  Cancer of upper-outer quadrant of female breast Stage II a T2, N0, M0 ER positive PR negative HER-2 negative Ki-67 13% status post mastectomy on the left breast 08/12/2011 currently on letrozole 2.5 mg daily. Tolerating it very well without any major problems or concerns. Her primary care physician ordered a bone density test which is pending currently. She is currently on  calcium and vitamin D. Breast exam does not reveal any palpable abnormalities.  Return to clinic in February after undergoing mammograms on the right breast. I ordered today.  Discussed the importance of physical exercise in decreasing the likelihood of breast cancer recurrence. Recommended 30 mins daily 6 days a week of either brisk walking or cycling or swimming. Encouraged patient to eat more fruits and vegetables and decrease red meat.    Orders Placed This Encounter  Procedures  . MM Digital Diagnostic Unilat R    Standing Status: Future     Number of Occurrences:      Standing Expiration Date: 02/11/2015    Order Specific Question:  Reason for Exam (SYMPTOM  OR DIAGNOSIS REQUIRED)    Answer:  History of left breast cancer status post mastectomy and evaluation of right breast    Order Specific Question:  Preferred  imaging location?    Answer:  [facility]  . Comprehensive metabolic panel (Cmet) - [facility]    Standing Status: Future     Number of Occurrences:      Standing Expiration Date: 02/11/2015  . CBC with Differential    Standing Status: Future     Number of Occurrences:      Standing Expiration Date: 02/11/2015   The patient has a good understanding of the overall plan. she agrees with it. She will call with any problems that may develop before her next visit here.  I spent 25 minutes counseling the patient face to face. The total time spent in the appointment was 30 minutes and more than 50% was on counseling and review of test results    Rulon Eisenmenger, MD 02/11/2014 9:01 AM

## 2014-02-12 DIAGNOSIS — H353290 Exudative age-related macular degeneration, unspecified eye, stage unspecified: Secondary | ICD-10-CM | POA: Diagnosis not present

## 2014-02-12 DIAGNOSIS — H259 Unspecified age-related cataract: Secondary | ICD-10-CM | POA: Diagnosis not present

## 2014-02-12 DIAGNOSIS — H35369 Drusen (degenerative) of macula, unspecified eye: Secondary | ICD-10-CM | POA: Diagnosis not present

## 2014-02-12 DIAGNOSIS — H43819 Vitreous degeneration, unspecified eye: Secondary | ICD-10-CM | POA: Diagnosis not present

## 2014-02-12 DIAGNOSIS — H353190 Nonexudative age-related macular degeneration, unspecified eye, stage unspecified: Secondary | ICD-10-CM | POA: Diagnosis not present

## 2014-03-21 ENCOUNTER — Telehealth: Payer: Self-pay | Admitting: Hematology and Oncology

## 2014-03-21 DIAGNOSIS — H3531 Nonexudative age-related macular degeneration: Secondary | ICD-10-CM | POA: Diagnosis not present

## 2014-03-21 DIAGNOSIS — Z79899 Other long term (current) drug therapy: Secondary | ICD-10-CM | POA: Diagnosis not present

## 2014-03-21 NOTE — Telephone Encounter (Signed)
Lvm advising appt chg from 2/16 (md pal) to 08/04/14 @ 10am. Also mailed appt calendar.

## 2014-03-28 ENCOUNTER — Telehealth: Payer: Self-pay | Admitting: Hematology and Oncology

## 2014-03-28 NOTE — Telephone Encounter (Signed)
sw. pt husband adn advised on changed appt d.t per pt request..Marland KitchenMarland Kitchen

## 2014-05-02 DIAGNOSIS — H43813 Vitreous degeneration, bilateral: Secondary | ICD-10-CM | POA: Diagnosis not present

## 2014-05-02 DIAGNOSIS — H35363 Drusen (degenerative) of macula, bilateral: Secondary | ICD-10-CM | POA: Diagnosis not present

## 2014-05-02 DIAGNOSIS — H259 Unspecified age-related cataract: Secondary | ICD-10-CM | POA: Diagnosis not present

## 2014-05-02 DIAGNOSIS — Z8669 Personal history of other diseases of the nervous system and sense organs: Secondary | ICD-10-CM | POA: Diagnosis not present

## 2014-05-02 DIAGNOSIS — H3531 Nonexudative age-related macular degeneration: Secondary | ICD-10-CM | POA: Diagnosis not present

## 2014-05-02 DIAGNOSIS — H3532 Exudative age-related macular degeneration: Secondary | ICD-10-CM | POA: Diagnosis not present

## 2014-05-17 ENCOUNTER — Telehealth: Payer: Self-pay | Admitting: Hematology and Oncology

## 2014-05-17 NOTE — Telephone Encounter (Signed)
s.w. pt and advised on Feb 24 appt time change....pt ok adn aware

## 2014-07-11 ENCOUNTER — Other Ambulatory Visit (INDEPENDENT_AMBULATORY_CARE_PROVIDER_SITE_OTHER): Payer: Medicare Other

## 2014-07-11 ENCOUNTER — Telehealth: Payer: Self-pay | Admitting: Internal Medicine

## 2014-07-11 DIAGNOSIS — K7689 Other specified diseases of liver: Secondary | ICD-10-CM

## 2014-07-11 DIAGNOSIS — R945 Abnormal results of liver function studies: Secondary | ICD-10-CM

## 2014-07-11 DIAGNOSIS — E7800 Pure hypercholesterolemia, unspecified: Secondary | ICD-10-CM

## 2014-07-11 DIAGNOSIS — C50419 Malignant neoplasm of upper-outer quadrant of unspecified female breast: Secondary | ICD-10-CM

## 2014-07-11 DIAGNOSIS — R5383 Other fatigue: Secondary | ICD-10-CM | POA: Diagnosis not present

## 2014-07-11 DIAGNOSIS — M810 Age-related osteoporosis without current pathological fracture: Secondary | ICD-10-CM | POA: Diagnosis not present

## 2014-07-11 LAB — LIPID PANEL
Cholesterol: 245 mg/dL — ABNORMAL HIGH (ref 0–200)
HDL: 78.2 mg/dL (ref >39.00)
LDL CALC: 145 mg/dL — AB (ref 0–99)
NonHDL: 166.8
Total CHOL/HDL Ratio: 3
Triglycerides: 107 mg/dL (ref 0.0–149.0)
VLDL: 21.4 mg/dL (ref 0.0–40.0)

## 2014-07-11 LAB — BASIC METABOLIC PANEL
BUN: 26 mg/dL — ABNORMAL HIGH (ref 6–23)
CALCIUM: 9.3 mg/dL (ref 8.4–10.5)
CO2: 27 meq/L (ref 19–32)
Chloride: 107 mEq/L (ref 96–112)
Creatinine, Ser: 0.95 mg/dL (ref 0.40–1.20)
GFR: 60.06 mL/min (ref >60.00)
Glucose, Bld: 78 mg/dL (ref 70–99)
Potassium: 3.7 mEq/L (ref 3.5–5.1)
Sodium: 141 mEq/L (ref 135–145)

## 2014-07-11 LAB — HEPATIC FUNCTION PANEL
ALK PHOS: 85 U/L (ref 39–117)
ALT: 15 U/L (ref 0–35)
AST: 35 U/L (ref 0–37)
Albumin: 3.8 g/dL (ref 3.5–5.2)
BILIRUBIN TOTAL: 0.4 mg/dL (ref 0.2–1.2)
Bilirubin, Direct: 0.1 mg/dL (ref 0.0–0.3)
Total Protein: 6.3 g/dL (ref 6.0–8.3)

## 2014-07-11 LAB — VITAMIN D 25 HYDROXY (VIT D DEFICIENCY, FRACTURES): VITD: 43.01 ng/mL (ref 30.00–100.00)

## 2014-07-11 LAB — TSH: TSH: 1.22 u[IU]/mL (ref 0.35–4.50)

## 2014-07-11 NOTE — Telephone Encounter (Signed)
Pt request a 3D mammogram. No order in system/msn

## 2014-07-11 NOTE — Telephone Encounter (Signed)
She has been followed by oncology given her history of breast cancer.   Does she want me to order now?   Where does she want to get her mammogram.

## 2014-07-14 NOTE — Telephone Encounter (Signed)
Left message for pt to return my call.

## 2014-07-15 ENCOUNTER — Encounter: Payer: Self-pay | Admitting: Internal Medicine

## 2014-07-15 ENCOUNTER — Ambulatory Visit (INDEPENDENT_AMBULATORY_CARE_PROVIDER_SITE_OTHER): Payer: Medicare Other | Admitting: Internal Medicine

## 2014-07-15 VITALS — BP 100/60 | HR 57 | Temp 97.8°F | Ht 64.0 in | Wt 97.2 lb

## 2014-07-15 DIAGNOSIS — Z0000 Encounter for general adult medical examination without abnormal findings: Secondary | ICD-10-CM | POA: Diagnosis not present

## 2014-07-15 DIAGNOSIS — K7689 Other specified diseases of liver: Secondary | ICD-10-CM

## 2014-07-15 DIAGNOSIS — C50411 Malignant neoplasm of upper-outer quadrant of right female breast: Secondary | ICD-10-CM

## 2014-07-15 DIAGNOSIS — M810 Age-related osteoporosis without current pathological fracture: Secondary | ICD-10-CM | POA: Diagnosis not present

## 2014-07-15 DIAGNOSIS — Z1211 Encounter for screening for malignant neoplasm of colon: Secondary | ICD-10-CM | POA: Diagnosis not present

## 2014-07-15 DIAGNOSIS — E7800 Pure hypercholesterolemia, unspecified: Secondary | ICD-10-CM

## 2014-07-15 DIAGNOSIS — R945 Abnormal results of liver function studies: Secondary | ICD-10-CM

## 2014-07-15 NOTE — Telephone Encounter (Signed)
Will discuss with patient @ her visit today

## 2014-07-15 NOTE — Progress Notes (Signed)
Pre visit review using our clinic review tool, if applicable. No additional management support is needed unless otherwise documented below in the visit note. 

## 2014-07-15 NOTE — Progress Notes (Signed)
Patient ID: Cassidy Martinez, female   DOB: 20-Aug-1931, 81 y.o.   MRN: [id number]   Subjective:    Patient ID: Cassidy Martinez, female    DOB: 10/28/1931, 81 y.o.   MRN: [id number]  HPI  Patient here for her physical exam.  Has a history of breast cancer, hypercholesterolemia and abnormal liver function tests.  States she is doing well.  Feels good.  Taking vision essentials for her eyes.  Sees Dr Tomi Likens.  Still working.  Stays active.  No cardiac symptoms with increased activity or exertion.  Still seeing oncology for her breast cancer.     Past Medical History  Diagnosis Date  . Hyperlipidemia   . PONV (postoperative nausea and vomiting)   . Arthritis     hands  . Heart murmur   . History of UTI     "have had a few over the years; haven't had once since ~ 2007"  . Carpal tunnel syndrome, bilateral   . Migraines     "used to have them severe; stopped when I took self off estrogen"  . Breast CA     left;; S/P mastectomy 08/12/11    Current Outpatient Prescriptions on File Prior to Visit  Medication Sig Dispense Refill  . Cholecalciferol (VITAMIN D-3 PO) Take 1 capsule by mouth 2 (two) times daily.      No current facility-administered medications on file prior to visit.    Review of Systems  Constitutional: Negative for fatigue and unexpected weight change.  HENT: Negative for congestion, sinus pressure and sore throat.   Eyes: Negative for pain and redness.  Respiratory: Negative for cough, chest tightness and shortness of breath.   Cardiovascular: Negative for chest pain, palpitations and leg swelling.  Gastrointestinal: Negative for nausea, vomiting, abdominal pain, diarrhea and constipation.  Genitourinary: Negative for frequency and difficulty urinating.  Musculoskeletal: Negative for back pain and joint swelling.  Skin: Negative for color change and rash.  Neurological: Negative for dizziness and headaches.  Hematological: Negative for adenopathy. Does not bruise/bleed easily.    Psychiatric/Behavioral: Negative for dysphoric mood and decreased concentration.       Objective:     Pulse recheck 60  Physical Exam  Constitutional: She is oriented to person, place, and time. She appears well-developed and well-nourished.  HENT:  Nose: Nose normal.  Mouth/Throat: Oropharynx is clear and moist.  Eyes: Right eye exhibits no discharge. Left eye exhibits no discharge. No scleral icterus.  Neck: Neck supple. No thyromegaly present.  Cardiovascular: Normal rate and regular rhythm.   Pulmonary/Chest: Breath sounds normal. No accessory muscle usage. No tachypnea. No respiratory distress. She has no decreased breath sounds. She has no wheezes. She has no rhonchi. Right breast exhibits no inverted nipple, no mass, no nipple discharge and no tenderness (no axillary adenopathy). Tenderness: no axilarry adenopathy.  Abdominal: Soft. Bowel sounds are normal. There is no tenderness.  Musculoskeletal: She exhibits no edema or tenderness.  Lymphadenopathy:    She has no cervical adenopathy.  Neurological: She is alert and oriented to person, place, and time.  Skin: Skin is warm. No rash noted.  Psychiatric: She has a normal mood and affect. Her behavior is normal.    BP 100/60 mmHg  Pulse 57  Temp(Src) 97.8 F (36.6 C) (Oral)  Ht 5\' 4"  (1.626 m)  Wt 97 lb 4 oz (44.112 kg)  BMI 16.68 kg/m2  SpO2 98% Wt Readings from Last 3 Encounters:  07/15/14 97 lb 4 oz (44.112 kg)  02/11/14 97 lb 6.4 oz (44.18 kg)  01/09/14 96 lb 4 oz (43.659 kg)     Lab Results  Component Value Date   WBC 8.4 08/28/2013   HGB 14.9 08/28/2013   HCT 45.0 08/28/2013   PLT 255.0 08/28/2013   GLUCOSE 78 07/11/2014   CHOL 245* 07/11/2014   TRIG 107.0 07/11/2014   HDL 78.20 07/11/2014   LDLDIRECT 174.2 07/09/2013   LDLCALC 145* 07/11/2014   ALT 15 07/11/2014   AST 35 07/11/2014   NA 141 07/11/2014   K 3.7 07/11/2014   CL 107 07/11/2014   CREATININE 0.95 07/11/2014   BUN 26* 07/11/2014    CO2 27 07/11/2014   TSH 1.22 07/11/2014    Dg Chest 2 View  08/27/2013   CLINICAL DATA:  Cough.  EXAM: CHEST  2 VIEW  COMPARISON:  August 04, 2011.  FINDINGS: The heart size and mediastinal contours are within normal limits. Both lungs are clear. The visualized skeletal structures are unremarkable.  IMPRESSION: No acute cardiopulmonary abnormality seen.   Electronically Signed   By: Sabino Dick M.D.   On: 08/27/2013 14:04       Assessment & Plan:   Problem List Items Addressed This Visit    Abnormal liver function    Recheck liver panel.   Worked and evaluated by Dr Tiffany Kocher.  He recommended to follow liver panel.        Cancer of upper-outer quadrant of female breast    Followed by oncology.  Has her mammogram scheduled.        Relevant Medications   letrozole (FEMARA) 2.5 MG tablet   Health care maintenance   Hypercholesterolemia    Low cholesterol diet and exercise.  Check lipid panel.  She declines cholesterol medication.        Osteoporosis - Primary    Continue calcium and vitamin D.  Continue weight bearing exercise.  She declines medication.        Relevant Medications   CALCIUM PO   Other Relevant Orders   DG Bone Density    Other Visit Diagnoses    Screening for colon cancer        Relevant Orders    Fecal occult blood, imunochemical      I spent 25 minutes with the patient and more than 50% of the time was spent in consultation regarding the above.     Einar Pheasant, MD

## 2014-07-17 ENCOUNTER — Other Ambulatory Visit: Payer: Self-pay | Admitting: Oncology

## 2014-07-17 ENCOUNTER — Encounter: Payer: Self-pay | Admitting: Internal Medicine

## 2014-07-17 DIAGNOSIS — Z0000 Encounter for general adult medical examination without abnormal findings: Secondary | ICD-10-CM | POA: Insufficient documentation

## 2014-07-17 NOTE — Assessment & Plan Note (Signed)
Continue calcium and vitamin D.  Continue weight bearing exercise.  She declines medication.

## 2014-07-17 NOTE — Assessment & Plan Note (Signed)
Recheck liver panel.   Worked and evaluated by Dr Tiffany Kocher.  He recommended to follow liver panel.

## 2014-07-17 NOTE — Assessment & Plan Note (Signed)
Low cholesterol diet and exercise.  Check lipid panel.  She declines cholesterol medication.

## 2014-07-17 NOTE — Assessment & Plan Note (Signed)
Followed by oncology.  Has her mammogram scheduled.

## 2014-07-22 ENCOUNTER — Other Ambulatory Visit (INDEPENDENT_AMBULATORY_CARE_PROVIDER_SITE_OTHER): Payer: Medicare Other

## 2014-07-22 DIAGNOSIS — Z1211 Encounter for screening for malignant neoplasm of colon: Secondary | ICD-10-CM | POA: Diagnosis not present

## 2014-07-22 LAB — FECAL OCCULT BLOOD, IMMUNOCHEMICAL: FECAL OCCULT BLD: NEGATIVE

## 2014-07-23 ENCOUNTER — Encounter: Payer: Self-pay | Admitting: *Deleted

## 2014-07-24 ENCOUNTER — Ambulatory Visit
Admission: RE | Admit: 2014-07-24 | Discharge: 2014-07-24 | Disposition: A | Discharge status: Home / Self Care | Payer: Medicare Other | Source: Ambulatory Visit | Attending: Hematology and Oncology | Admitting: Hematology and Oncology

## 2014-07-24 DIAGNOSIS — Z853 Personal history of malignant neoplasm of breast: Secondary | ICD-10-CM | POA: Diagnosis not present

## 2014-07-24 DIAGNOSIS — Z1231 Encounter for screening mammogram for malignant neoplasm of breast: Secondary | ICD-10-CM | POA: Diagnosis not present

## 2014-07-29 ENCOUNTER — Ambulatory Visit: Payer: Medicare Other | Admitting: Hematology and Oncology

## 2014-07-29 ENCOUNTER — Encounter: Payer: Self-pay | Admitting: Internal Medicine

## 2014-07-29 ENCOUNTER — Other Ambulatory Visit: Payer: Medicare Other

## 2014-07-29 ENCOUNTER — Ambulatory Visit: Payer: Self-pay | Admitting: Internal Medicine

## 2014-07-29 DIAGNOSIS — M810 Age-related osteoporosis without current pathological fracture: Secondary | ICD-10-CM | POA: Diagnosis not present

## 2014-07-29 LAB — HM DEXA SCAN

## 2014-08-04 ENCOUNTER — Other Ambulatory Visit: Payer: Medicare Other

## 2014-08-04 ENCOUNTER — Ambulatory Visit: Payer: Medicare Other | Admitting: Hematology and Oncology

## 2014-08-06 ENCOUNTER — Ambulatory Visit (HOSPITAL_BASED_OUTPATIENT_CLINIC_OR_DEPARTMENT_OTHER): Payer: Medicare Other | Admitting: Hematology and Oncology

## 2014-08-06 ENCOUNTER — Other Ambulatory Visit: Payer: Medicare Other

## 2014-08-06 ENCOUNTER — Other Ambulatory Visit (HOSPITAL_BASED_OUTPATIENT_CLINIC_OR_DEPARTMENT_OTHER): Payer: Medicare Other

## 2014-08-06 ENCOUNTER — Ambulatory Visit: Payer: Medicare Other | Admitting: Hematology and Oncology

## 2014-08-06 ENCOUNTER — Telehealth: Payer: Self-pay | Admitting: Hematology and Oncology

## 2014-08-06 VITALS — BP 149/70 | HR 72 | Temp 98.0°F | Resp 18 | Ht 64.0 in | Wt 97.5 lb

## 2014-08-06 DIAGNOSIS — C50812 Malignant neoplasm of overlapping sites of left female breast: Secondary | ICD-10-CM

## 2014-08-06 DIAGNOSIS — C50412 Malignant neoplasm of upper-outer quadrant of left female breast: Secondary | ICD-10-CM

## 2014-08-06 DIAGNOSIS — C50411 Malignant neoplasm of upper-outer quadrant of right female breast: Secondary | ICD-10-CM

## 2014-08-06 LAB — COMPREHENSIVE METABOLIC PANEL (CC13)
ALT: 17 U/L (ref 0–55)
ANION GAP: 9 meq/L (ref 3–11)
AST: 40 U/L — ABNORMAL HIGH (ref 5–34)
Albumin: 3.6 g/dL (ref 3.5–5.0)
Alkaline Phosphatase: 103 U/L (ref 40–150)
BUN: 24.9 mg/dL (ref 7.0–26.0)
CALCIUM: 9.6 mg/dL (ref 8.4–10.4)
CO2: 26 mEq/L (ref 22–29)
CREATININE: 1.1 mg/dL (ref 0.6–1.1)
Chloride: 106 mEq/L (ref 98–109)
EGFR: 50 mL/min/{1.73_m2} — AB (ref >90)
GLUCOSE: 96 mg/dL (ref 70–140)
POTASSIUM: 4.4 meq/L (ref 3.5–5.1)
Sodium: 141 mEq/L (ref 136–145)
Total Bilirubin: 0.41 mg/dL (ref 0.20–1.20)
Total Protein: 6.5 g/dL (ref 6.4–8.3)

## 2014-08-06 LAB — CBC WITH DIFFERENTIAL/PLATELET
BASO%: 1 % (ref 0.0–2.0)
Basophils Absolute: 0.1 10*3/uL (ref 0.0–0.1)
EOS ABS: 0.2 10*3/uL (ref 0.0–0.5)
EOS%: 2.1 % (ref 0.0–7.0)
HCT: 42.9 % (ref 34.8–46.6)
HGB: 13.6 g/dL (ref 11.6–15.9)
LYMPH#: 1.7 10*3/uL (ref 0.9–3.3)
LYMPH%: 21.7 % (ref 14.0–49.7)
MCH: 29 pg (ref 25.1–34.0)
MCHC: 31.8 g/dL (ref 31.5–36.0)
MCV: 91.3 fL (ref 79.5–101.0)
MONO#: 0.7 10*3/uL (ref 0.1–0.9)
MONO%: 8.1 % (ref 0.0–14.0)
NEUT#: 5.4 10*3/uL (ref 1.5–6.5)
NEUT%: 67.1 % (ref 38.4–76.8)
Platelets: 194 10*3/uL (ref 145–400)
RBC: 4.7 10*6/uL (ref 3.70–5.45)
RDW: 13.7 % (ref 11.2–14.5)
WBC: 8.1 10*3/uL (ref 3.9–10.3)

## 2014-08-06 NOTE — Assessment & Plan Note (Addendum)
Stage II a T2, N0, M0 ER positive PR negative HER-2 negative Ki-67 13% status post mastectomy on the left breast 08/12/2011 currently on letrozole 2.5 mg daily  Letrozole toxicities: No major side effects to treatment, being monitored for osteoporosis currently takes calcium and vitamin D, Patient had a T score of -2.3 in 2013 and recently had another bone density recently.I recommended that she take Bisphosphonates and she will discuss with her PCP regarding this.  Breast cancer surveillance: 1. Breast exam September 2015 is normal  2. Right breast mammogram 07/24/2014 is normal  Return to clinic in 1 yr for follow-up

## 2014-08-06 NOTE — Telephone Encounter (Signed)
per pof to sch pt appt-gave pt copy of sch °

## 2014-08-06 NOTE — Progress Notes (Signed)
Patient Care Team: Einar Pheasant, MD as PCP - General (Unknown Physician Specialty) Deatra Robinson, MD (Hematology and Oncology)  DIAGNOSIS: Primary cancer of upper outer quadrant of left female breast   Staging form: Breast, AJCC 7th Edition     Clinical: No stage assigned - Unsigned     Pathologic: Stage IIA (T2, N0, cM0) - Signed by Rulon Eisenmenger, MD on 02/11/2014   SUMMARY OF ONCOLOGIC HISTORY:   Primary cancer of upper outer quadrant of left female breast   08/12/2011 Surgery Left mastectomy 2.2 cm tumor ER 100% positive PR negative HER-2 negative Ki-67 13% . (Prior History of partial right mastectomy for atypical ductal hyperplasia October 2010)   09/01/2011 -  Anti-estrogen oral therapy  letrozole 2.5 mg daily    CHIEF COMPLIANT: follow-up of breast cancer  INTERVAL HISTORY: Cassidy Martinez is a 81 year old with above-mentioned history of left mastectomy and on antiestrogen therapy with letrozole since March 2013. She is tolerating it fairly well without any major problems or concerns. She recently had a bone density test which I do not have the copy of the results. She denies any new lumps or nodules in the breast.  REVIEW OF SYSTEMS:   Constitutional: Denies fevers, chills or abnormal weight loss Eyes: Denies blurriness of vision Ears, nose, mouth, throat, and face: Denies mucositis or sore throat Respiratory: Denies cough, dyspnea or wheezes Cardiovascular: Denies palpitation, chest discomfort or lower extremity swelling Gastrointestinal:  Denies nausea, heartburn or change in bowel habits Skin: Denies abnormal skin rashes Lymphatics: Denies new lymphadenopathy or easy bruising Neurological:Denies numbness, tingling or new weaknesses Behavioral/Psych: Mood is stable, no new changes  Breast:  denies any pain or lumps or nodules in right breast. Left breast had mastectomy All other systems were reviewed with the patient and are negative.  I have reviewed the past medical  history, past surgical history, social history and family history with the patient and they are unchanged from previous note.  ALLERGIES:  is allergic to sulfa antibiotics; macrobid; and penicillins.  MEDICATIONS:  Current Outpatient Prescriptions  Medication Sig Dispense Refill  . CALCIUM PO Take 1,000 mg by mouth daily.    . Cholecalciferol (VITAMIN D-3 PO) Take 1 capsule by mouth 2 (two) times daily.     Marland Kitchen letrozole (FEMARA) 2.5 MG tablet TAKE 1 TABLET (2.5 MG TOTAL) BY MOUTH DAILY. 90 tablet 2  . Multiple Vitamins-Minerals (VISION VITAMINS PO) Take by mouth. Vision Essential     No current facility-administered medications for this visit.    PHYSICAL EXAMINATION: ECOG PERFORMANCE STATUS: 0 - Asymptomatic  Filed Vitals:   08/06/14 1328  BP: 149/70  Pulse: 72  Temp: 98 F (36.7 C)  Resp: 18   Filed Weights   08/06/14 1328  Weight: 97 lb 8 oz (44.226 kg)    GENERAL:alert, no distress and comfortable SKIN: skin color, texture, turgor are normal, no rashes or significant lesions EYES: normal, Conjunctiva are pink and non-injected, sclera clear OROPHARYNX:no exudate, no erythema and lips, buccal mucosa, and tongue normal  NECK: supple, thyroid normal size, non-tender, without nodularity LYMPH:  no palpable lymphadenopathy in the cervical, axillary or inguinal LUNGS: clear to auscultation and percussion with normal breathing effort HEART: regular rate & rhythm and no murmurs and no lower extremity edema ABDOMEN:abdomen soft, non-tender and normal bowel sounds Musculoskeletal:no cyanosis of digits and no clubbing  NEURO: alert & oriented x 3 with fluent speech, no focal motor/sensory deficits BREAST: No palpable masses or nodules in either  right breasts. Left chest wall is without nodularity.No palpable axillary supraclavicular or infraclavicular adenopathy no breast tenderness or nipple discharge. (exam performed in the presence of a chaperone)  LABORATORY DATA:  I have  reviewed the data as listed   Chemistry      Component Value Date/Time   NA 141 07/11/2014 0802   NA 141 07/04/2013 0953   K 3.7 07/11/2014 0802   K 4.5 07/04/2013 0953   CL 107 07/11/2014 0802   CL 103 07/05/2012 0913   CO2 27 07/11/2014 0802   CO2 28 07/04/2013 0953   BUN 26* 07/11/2014 0802   BUN 24.1 07/04/2013 0953   CREATININE 0.95 07/11/2014 0802   CREATININE 0.9 07/04/2013 0953      Component Value Date/Time   CALCIUM 9.3 07/11/2014 0802   CALCIUM 9.9 07/04/2013 0953   ALKPHOS 85 07/11/2014 0802   ALKPHOS 66 07/04/2013 0953   AST 35 07/11/2014 0802   AST 39* 07/04/2013 0953   ALT 15 07/11/2014 0802   ALT 15 07/04/2013 0953   BILITOT 0.4 07/11/2014 0802   BILITOT 0.52 07/04/2013 0953       Lab Results  Component Value Date   WBC 8.1 08/06/2014   HGB 13.6 08/06/2014   HCT 42.9 08/06/2014   MCV 91.3 08/06/2014   PLT 194 08/06/2014   NEUTROABS 5.4 08/06/2014    ASSESSMENT & PLAN:  Primary cancer of upper outer quadrant of left female breast Stage II a T2, N0, M0 ER positive PR negative HER-2 negative Ki-67 13% status post mastectomy on the left breast 08/12/2011 currently on letrozole 2.5 mg daily  Letrozole toxicities: No major side effects to treatment, being monitored for osteoporosis currently takes calcium and vitamin D, Patient had a T score of -2.3 in 2013 and recently had another bone density recently.I recommended that she take Bisphosphonates and she will discuss with her PCP regarding this.  Breast cancer surveillance: 1. Breast exam September 2015 is normal  2. Right breast mammogram 07/24/2014 is normal  Return to clinic in 1 yr for follow-up    Orders Placed This Encounter  Procedures  . CBC with Differential    Standing Status: Future     Number of Occurrences:      Standing Expiration Date: 08/06/2015  . Comprehensive metabolic panel (Cmet) - [facility]    Standing Status: Future     Number of Occurrences:      Standing Expiration Date:  08/06/2015   The patient has a good understanding of the overall plan. she agrees with it. She will call with any problems that may develop before her next visit here.   Rulon Eisenmenger, MD

## 2014-08-07 ENCOUNTER — Telehealth: Payer: Self-pay | Admitting: Internal Medicine

## 2014-08-07 NOTE — Telephone Encounter (Signed)
Pt notified. appt scheduled.

## 2014-08-07 NOTE — Telephone Encounter (Signed)
LMTCB

## 2014-08-07 NOTE — Telephone Encounter (Signed)
Can schedule appt for pt to discuss.  First available.  (no emergency).  She has declined medication in the past.

## 2014-08-07 NOTE — Telephone Encounter (Signed)
Pt stopped by the office to state that she was seen by the Cancer center and was told that with her density results she should be on medication. Please advise pt/msn

## 2014-08-11 ENCOUNTER — Encounter: Payer: Self-pay | Admitting: *Deleted

## 2014-09-01 DIAGNOSIS — C50912 Malignant neoplasm of unspecified site of left female breast: Secondary | ICD-10-CM | POA: Diagnosis not present

## 2014-09-01 DIAGNOSIS — Z9012 Acquired absence of left breast and nipple: Secondary | ICD-10-CM | POA: Diagnosis not present

## 2014-09-01 DIAGNOSIS — M810 Age-related osteoporosis without current pathological fracture: Secondary | ICD-10-CM | POA: Diagnosis not present

## 2014-09-10 ENCOUNTER — Encounter: Payer: Self-pay | Admitting: Internal Medicine

## 2014-09-10 ENCOUNTER — Ambulatory Visit (INDEPENDENT_AMBULATORY_CARE_PROVIDER_SITE_OTHER): Payer: Medicare Other | Admitting: Internal Medicine

## 2014-09-10 VITALS — BP 100/60 | HR 56 | Temp 97.7°F | Ht 64.0 in | Wt 98.0 lb

## 2014-09-10 DIAGNOSIS — C50412 Malignant neoplasm of upper-outer quadrant of left female breast: Secondary | ICD-10-CM

## 2014-09-10 DIAGNOSIS — M810 Age-related osteoporosis without current pathological fracture: Secondary | ICD-10-CM | POA: Diagnosis not present

## 2014-09-10 MED ORDER — ALENDRONATE SODIUM 70 MG PO TABS: 70.0000 mg | ORAL_TABLET | ORAL | Status: DC

## 2014-09-10 NOTE — Progress Notes (Signed)
Pre visit review using our clinic review tool, if applicable. No additional management support is needed unless otherwise documented below in the visit note. 

## 2014-09-10 NOTE — Patient Instructions (Signed)
caltrate 600mg  plus vitamin D.  Take one tablet 2x/day

## 2014-09-14 ENCOUNTER — Encounter: Payer: Self-pay | Admitting: Internal Medicine

## 2014-09-14 NOTE — Assessment & Plan Note (Signed)
Just evaluated.  Everything checked out fine.  Recommended f/u with Dr Dalbert Batman in one year and Dr Lindi Adie q 6 months.

## 2014-09-14 NOTE — Assessment & Plan Note (Signed)
Discussed treatment options and side effects as outlined.   Will complete form for reclast.  Continue calcium, vitamin D and weight bearing exercise.

## 2014-09-14 NOTE — Progress Notes (Signed)
Patient ID: Cassidy Martinez, female   DOB: [birth year], 81 y.o.   MRN: [id number]   Subjective:    Patient ID: Cassidy Martinez, female    DOB: [birth year], 81 y.o.   MRN: [id number]  HPI  Patient here for a scheduled follow up to discuss her bone density results.  Bone density 07/29/14 revealed T-score of -2.9 - radius and T-score of -2.7 in femur (neck).  Discussed treatment options including nasal spray, evista, bisphosphonates, etc.  We discussed oral bisphosphanates and IV forms.  Discussed possible side effects, etc.  She is overall doing relatively well.  Just saw her surgeon regarding her breast.  States everything checked out fine.  Recommended f/u with Dr Dalbert Batman in one year and Dr Lindi Adie q 6 months.     Past Medical History  Diagnosis Date  . Hyperlipidemia   . PONV (postoperative nausea and vomiting)   . Arthritis     hands  . Heart murmur   . History of UTI     "have had a few over the years; haven't had once since ~ 2007"  . Carpal tunnel syndrome, bilateral   . Migraines     "used to have them severe; stopped when I took self off estrogen"  . Breast CA     left;; S/P mastectomy 08/12/11    Outpatient Encounter Prescriptions as of 09/10/2014  Medication Sig  . CALCIUM PO Take 1,000 mg by mouth daily.  . Cholecalciferol (VITAMIN D-3 PO) Take 1 capsule by mouth 2 (two) times daily.   Marland Kitchen letrozole (FEMARA) 2.5 MG tablet TAKE 1 TABLET (2.5 MG TOTAL) BY MOUTH DAILY.  . Multiple Vitamins-Minerals (VISION VITAMINS PO) Take by mouth. Vision Essential  . alendronate (FOSAMAX) 70 MG tablet Take 1 tablet (70 mg total) by mouth every 7 (seven) days. Take with a full glass of water on an empty stomach.    Review of Systems  Constitutional: Negative for appetite change and unexpected weight change.  HENT: Negative for congestion and sinus pressure.   Respiratory: Negative for cough and shortness of breath.   Cardiovascular: Negative for chest pain, palpitations and leg swelling.    Gastrointestinal: Negative for nausea, vomiting, abdominal pain and diarrhea.       Objective:    Physical Exam  Constitutional: She appears well-developed and well-nourished. No distress.  HENT:  Nose: Nose normal.  Mouth/Throat: Oropharynx is clear and moist.  Neck: Neck supple.  Cardiovascular: Normal rate and regular rhythm.   Pulmonary/Chest: Breath sounds normal. No respiratory distress. She has no wheezes.  Lymphadenopathy:    She has no cervical adenopathy.    BP 100/60 mmHg  Pulse 56  Temp(Src) 97.7 F (36.5 C) (Oral)  Ht 5\' 4"  (1.626 m)  Wt 98 lb (44.453 kg)  BMI 16.81 kg/m2  SpO2 98% Wt Readings from Last 3 Encounters:  09/10/14 98 lb (44.453 kg)  08/06/14 97 lb 8 oz (44.226 kg)  07/15/14 97 lb 4 oz (44.112 kg)     Lab Results  Component Value Date   WBC 8.1 08/06/2014   HGB 13.6 08/06/2014   HCT 42.9 08/06/2014   PLT 194 08/06/2014   GLUCOSE 96 08/06/2014   CHOL 245* 07/11/2014   TRIG 107.0 07/11/2014   HDL 78.20 07/11/2014   LDLDIRECT 174.2 07/09/2013   LDLCALC 145* 07/11/2014   ALT 17 08/06/2014   AST 40* 08/06/2014   NA 141 08/06/2014   K 4.4 08/06/2014   CL 107 07/11/2014   CREATININE 1.1  08/06/2014   BUN 24.9 08/06/2014   CO2 26 08/06/2014   TSH 1.22 07/11/2014       Assessment & Plan:   Problem List Items Addressed This Visit    Osteoporosis - Primary    Discussed treatment options and side effects as outlined.   Will complete form for reclast.  Continue calcium, vitamin D and weight bearing exercise.        Relevant Medications   ALENDRONATE SODIUM 70 MG PO TABS   Primary cancer of upper outer quadrant of left female breast    Just evaluated.  Everything checked out fine.  Recommended f/u with Dr Dalbert Batman in one year and Dr Lindi Adie q 6 months.            Einar Pheasant, MD

## 2014-10-09 ENCOUNTER — Telehealth: Payer: Self-pay | Admitting: *Deleted

## 2014-10-09 DIAGNOSIS — M810 Age-related osteoporosis without current pathological fracture: Secondary | ICD-10-CM

## 2014-10-09 NOTE — Telephone Encounter (Signed)
Ms Coriz notified that urinalysis is needed to completed the Urine Protein dipstick portion of the reclast form. Pt plans to stop by tomorrow. Needs order placed.

## 2014-10-09 NOTE — Telephone Encounter (Signed)
Order placed for urinalysis 

## 2014-10-10 ENCOUNTER — Other Ambulatory Visit (INDEPENDENT_AMBULATORY_CARE_PROVIDER_SITE_OTHER): Payer: Medicare Other

## 2014-10-10 DIAGNOSIS — M810 Age-related osteoporosis without current pathological fracture: Secondary | ICD-10-CM

## 2014-10-10 LAB — URINALYSIS, ROUTINE W REFLEX MICROSCOPIC
Bilirubin Urine: NEGATIVE
HGB URINE DIPSTICK: NEGATIVE
Ketones, ur: NEGATIVE
Leukocytes, UA: NEGATIVE
Nitrite: NEGATIVE
SPECIFIC GRAVITY, URINE: 1.02 (ref 1.000–1.030)
Total Protein, Urine: NEGATIVE
URINE GLUCOSE: NEGATIVE
UROBILINOGEN UA: 0.2 (ref 0.0–1.0)
pH: 5.5 (ref 5.0–8.0)

## 2014-11-04 DIAGNOSIS — M810 Age-related osteoporosis without current pathological fracture: Secondary | ICD-10-CM | POA: Diagnosis not present

## 2014-12-12 ENCOUNTER — Telehealth: Payer: Self-pay | Admitting: Internal Medicine

## 2014-12-12 DIAGNOSIS — L209 Atopic dermatitis, unspecified: Secondary | ICD-10-CM | POA: Diagnosis not present

## 2014-12-12 NOTE — Telephone Encounter (Signed)
Patient Name: Cassidy Martinez  DOB: Mar 02, 1932    Initial Comment Caller states breaking out on her legs down to her feet, itching   Nurse Assessment  Nurse: Mallie Mussel, RN, Alveta Heimlich Date/Time (Eastern Time): 12/12/2014 9:34:30 AM  Confirm and document reason for call. If symptomatic, describe symptoms. ---Caller states that she has red welts on both of her legs which began a couple weeks ago and keeps getting worse. She rates her itching as severe. The left leg is worse than the right. Denies fever.  Has the patient traveled out of the country within the last 30 days? ---No  Does the patient require triage? ---Yes  Related visit to physician within the last 2 weeks? ---No  Does the PT have any chronic conditions? (i.e. diabetes, asthma, etc.) ---Yes  List chronic conditions. ---Low Na+     Guidelines    Guideline Title Affirmed Question Affirmed Notes  Hives Joint swelling swelling in hands are worse   Final Disposition User   See Physician within [facility], RN, Alveta Heimlich    Comments  I tried to make an appointment for her, but there were none available. Once I told her this, she states that she will just go to the [facility], she has been there before.

## 2014-12-12 NOTE — Telephone Encounter (Signed)
Please f/u and see if pt was evaluated.

## 2014-12-12 NOTE — Telephone Encounter (Signed)
Spoke with pt, she states she was seen.  She further states she was prescribed a lotion and an ointment.

## 2014-12-12 NOTE — Telephone Encounter (Signed)
Noted.  Keep [place] posted and let us know if persistent problems.

## 2014-12-12 NOTE — Telephone Encounter (Signed)
FYI

## 2015-01-09 ENCOUNTER — Other Ambulatory Visit (INDEPENDENT_AMBULATORY_CARE_PROVIDER_SITE_OTHER): Payer: Medicare Other

## 2015-01-09 DIAGNOSIS — E7800 Pure hypercholesterolemia, unspecified: Secondary | ICD-10-CM

## 2015-01-09 DIAGNOSIS — C50411 Malignant neoplasm of upper-outer quadrant of right female breast: Secondary | ICD-10-CM | POA: Diagnosis not present

## 2015-01-09 DIAGNOSIS — K7689 Other specified diseases of liver: Secondary | ICD-10-CM

## 2015-01-09 DIAGNOSIS — R945 Abnormal results of liver function studies: Secondary | ICD-10-CM

## 2015-01-09 LAB — LIPID PANEL
Cholesterol: 274 mg/dL — ABNORMAL HIGH (ref 0–200)
HDL: 96.2 mg/dL (ref >39.00)
LDL Cholesterol: 162 mg/dL — ABNORMAL HIGH (ref 0–99)
NonHDL: 177.71
Total CHOL/HDL Ratio: 3
Triglycerides: 80 mg/dL (ref 0.0–149.0)
VLDL: 16 mg/dL (ref 0.0–40.0)

## 2015-01-09 LAB — BASIC METABOLIC PANEL
BUN: 18 mg/dL (ref 6–23)
CO2: 29 mEq/L (ref 19–32)
Calcium: 9.6 mg/dL (ref 8.4–10.5)
Chloride: 104 mEq/L (ref 96–112)
Creatinine, Ser: 1 mg/dL (ref 0.40–1.20)
GFR: 56.54 mL/min — AB (ref >60.00)
Glucose, Bld: 83 mg/dL (ref 70–99)
POTASSIUM: 4.6 meq/L (ref 3.5–5.1)
Sodium: 140 mEq/L (ref 135–145)

## 2015-01-09 LAB — HEPATIC FUNCTION PANEL
ALT: 17 U/L (ref 0–35)
AST: 43 U/L — ABNORMAL HIGH (ref 0–37)
Albumin: 4.1 g/dL (ref 3.5–5.2)
Alkaline Phosphatase: 68 U/L (ref 39–117)
Bilirubin, Direct: 0.1 mg/dL (ref 0.0–0.3)
TOTAL PROTEIN: 6.9 g/dL (ref 6.0–8.3)
Total Bilirubin: 0.6 mg/dL (ref 0.2–1.2)

## 2015-01-13 ENCOUNTER — Ambulatory Visit (INDEPENDENT_AMBULATORY_CARE_PROVIDER_SITE_OTHER): Payer: Medicare Other | Admitting: Internal Medicine

## 2015-01-13 ENCOUNTER — Encounter: Payer: Self-pay | Admitting: Internal Medicine

## 2015-01-13 VITALS — BP 106/60 | HR 57 | Temp 98.0°F | Ht 64.0 in | Wt 97.1 lb

## 2015-01-13 DIAGNOSIS — R634 Abnormal weight loss: Secondary | ICD-10-CM

## 2015-01-13 DIAGNOSIS — C50412 Malignant neoplasm of upper-outer quadrant of left female breast: Secondary | ICD-10-CM

## 2015-01-13 DIAGNOSIS — M810 Age-related osteoporosis without current pathological fracture: Secondary | ICD-10-CM | POA: Diagnosis not present

## 2015-01-13 DIAGNOSIS — E7800 Pure hypercholesterolemia, unspecified: Secondary | ICD-10-CM

## 2015-01-13 DIAGNOSIS — R221 Localized swelling, mass and lump, neck: Secondary | ICD-10-CM

## 2015-01-13 DIAGNOSIS — K7689 Other specified diseases of liver: Secondary | ICD-10-CM

## 2015-01-13 DIAGNOSIS — R945 Abnormal results of liver function studies: Secondary | ICD-10-CM

## 2015-01-13 DIAGNOSIS — Z0000 Encounter for general adult medical examination without abnormal findings: Secondary | ICD-10-CM

## 2015-01-13 NOTE — Progress Notes (Signed)
Pre visit review using our clinic review tool, if applicable. No additional management support is needed unless otherwise documented below in the visit note. 

## 2015-01-13 NOTE — Progress Notes (Signed)
Patient ID: Cassidy Martinez, female   DOB: 1932/04/13, 81 y.o.   MRN: [id number]   Subjective:    Patient ID: Cassidy Martinez, female    DOB: [birth year], 81 y.o.   MRN: [id number]  HPI  Patient here for a scheduled follow up.  Cholesterol has increased.  She desires not to take cholesterol medication.  Tries to watch her diet.  Stays active.  No cardiac symptoms with increased activity or exertion.  No sob.  No nausea or vomiting.  No bowel change.  Still working.     Past Medical History  Diagnosis Date  . Hyperlipidemia   . PONV (postoperative nausea and vomiting)   . Arthritis     hands  . Heart murmur   . History of UTI     "have had a few over the years; haven't had once since ~ 2007"  . Carpal tunnel syndrome, bilateral   . Migraines     "used to have them severe; stopped when I took self off estrogen"  . Breast CA     left;; S/P mastectomy 08/12/11    Outpatient Encounter Prescriptions as of 01/13/2015  Medication Sig  . alendronate (FOSAMAX) 70 MG tablet Take 1 tablet (70 mg total) by mouth every 7 (seven) days. Take with a full glass of water on an empty stomach.  Marland Kitchen CALCIUM PO Take 1,000 mg by mouth daily.  . Cholecalciferol (VITAMIN D-3 PO) Take 1 capsule by mouth 2 (two) times daily.   . Emollient (CERAVE) LOTN   . letrozole (FEMARA) 2.5 MG tablet TAKE 1 TABLET (2.5 MG TOTAL) BY MOUTH DAILY.  . Multiple Vitamins-Minerals (VISION VITAMINS PO) Take by mouth. Vision Essential  . triamcinolone cream (KENALOG) 0.1 % APPLY TOPICALLY 2 (TWO) TIMES DAILY.   No facility-administered encounter medications on file as of 01/13/2015.    Review of Systems  Constitutional: Positive for fatigue. Negative for fever and appetite change.  HENT: Negative for congestion and sinus pressure.   Respiratory: Negative for cough, chest tightness and shortness of breath.   Cardiovascular: Negative for chest pain, palpitations and leg swelling.  Gastrointestinal: Negative for nausea, vomiting, abdominal  pain and diarrhea.  Genitourinary: Negative for dysuria and difficulty urinating.  Musculoskeletal: Negative for back pain and joint swelling.  Skin: Negative for color change and rash.  Neurological: Negative for dizziness, light-headedness and headaches.  Psychiatric/Behavioral: Negative for dysphoric mood and agitation.       Objective:    Physical Exam  Constitutional: She appears well-developed and well-nourished. No distress.  HENT:  Nose: Nose normal.  Mouth/Throat: Oropharynx is clear and moist.  Neck: Neck supple. No thyromegaly present.  Cardiovascular: Normal rate and regular rhythm.   Pulmonary/Chest: Breath sounds normal. No respiratory distress. She has no wheezes.  Abdominal: Soft. Bowel sounds are normal. There is no tenderness.  Musculoskeletal: She exhibits no edema or tenderness.  Lymphadenopathy:    She has no cervical adenopathy.  Skin: No rash noted. No erythema.  Psychiatric: She has a normal mood and affect. Her behavior is normal.    BP 106/60 mmHg  Pulse 57  Temp(Src) 98 F (36.7 C) (Oral)  Ht 5\' 4"  (1.626 m)  Wt 97 lb 2 oz (44.056 kg)  BMI 16.66 kg/m2  SpO2 97% Wt Readings from Last 3 Encounters:  01/13/15 97 lb 2 oz (44.056 kg)  09/10/14 98 lb (44.453 kg)  08/06/14 97 lb 8 oz (44.226 kg)     Lab Results  Component Value Date  WBC 8.1 08/06/2014   HGB 13.6 08/06/2014   HCT 42.9 08/06/2014   PLT 194 08/06/2014   GLUCOSE 83 01/09/2015   CHOL 274* 01/09/2015   TRIG 80.0 01/09/2015   HDL 96.20 01/09/2015   LDLDIRECT 174.2 07/09/2013   LDLCALC 162* 01/09/2015   ALT 17 01/09/2015   AST 43* 01/09/2015   NA 140 01/09/2015   K 4.6 01/09/2015   CL 104 01/09/2015   CREATININE 1.00 01/09/2015   BUN 18 01/09/2015   CO2 29 01/09/2015   TSH 1.22 07/11/2014       Assessment & Plan:   Problem List Items Addressed This Visit    Abnormal liver function    Worked up by Dr Tiffany Kocher.  Liver function tests stable.  Follow.        Relevant  Orders   Hepatic function panel   Health care maintenance    Last colonoscopy 12/2000.  She declines colonoscopy.  IFOB negative 07/22/14.  Mammogram 2/11/6 - Birads I.        Hypercholesterolemia    Cholesterol increased from last check.  She wants to hold on starting cholesterol medication.  Had issues with increased LFTs previously.  Follow.        Relevant Orders   Lipid panel   Neck fullness    Saw ENT.  Felt variant in anatomy.  Felt no further w/up warranted.        Osteoporosis - Primary    Have discussed treatment options and side effects.  Continue calcium, vitamin D and exercise.  Follow.        Relevant Orders   Vit D  25 hydroxy (rtn osteoporosis monitoring)   Primary cancer of upper outer quadrant of left female breast    Followed by oncology.  Mammogram 07/24/14 - Birads I.       Relevant Orders   CBC with Differential/Platelet   Basic metabolic panel    Other Visit Diagnoses    Loss of weight        Relevant Orders    TSH      I spent 25 minutes with the patient and more than 50% of the time was spent in consultation regarding the above.     Einar Pheasant, MD

## 2015-01-14 ENCOUNTER — Encounter: Payer: Self-pay | Admitting: Internal Medicine

## 2015-01-14 NOTE — Assessment & Plan Note (Addendum)
Followed by oncology.  Mammogram 07/24/14 - Birads I.

## 2015-01-14 NOTE — Assessment & Plan Note (Signed)
Saw ENT.  Felt variant in anatomy.  Felt no further w/up warranted.

## 2015-01-14 NOTE — Assessment & Plan Note (Signed)
Last colonoscopy 12/2000.  She declines colonoscopy.  IFOB negative 07/22/14.  Mammogram 2/11/6 - Birads I.

## 2015-01-14 NOTE — Assessment & Plan Note (Signed)
Worked up by Dr Tiffany Kocher.  Liver function tests stable.  Follow.

## 2015-01-14 NOTE — Assessment & Plan Note (Signed)
Cholesterol increased from last check.  She wants to hold on starting cholesterol medication.  Had issues with increased LFTs previously.  Follow.

## 2015-01-14 NOTE — Assessment & Plan Note (Signed)
Have discussed treatment options and side effects.  Continue calcium, vitamin D and exercise.  Follow.

## 2015-02-04 DIAGNOSIS — M722 Plantar fascial fibromatosis: Secondary | ICD-10-CM | POA: Diagnosis not present

## 2015-03-13 DIAGNOSIS — J069 Acute upper respiratory infection, unspecified: Secondary | ICD-10-CM | POA: Diagnosis not present

## 2015-03-25 DIAGNOSIS — M722 Plantar fascial fibromatosis: Secondary | ICD-10-CM | POA: Diagnosis not present

## 2015-04-11 DIAGNOSIS — M79672 Pain in left foot: Secondary | ICD-10-CM | POA: Diagnosis not present

## 2015-04-11 DIAGNOSIS — M79675 Pain in left toe(s): Secondary | ICD-10-CM | POA: Diagnosis not present

## 2015-04-15 DIAGNOSIS — M79675 Pain in left toe(s): Secondary | ICD-10-CM | POA: Diagnosis not present

## 2015-04-19 ENCOUNTER — Other Ambulatory Visit: Payer: Self-pay | Admitting: Hematology and Oncology

## 2015-04-20 ENCOUNTER — Other Ambulatory Visit: Payer: Self-pay | Admitting: *Deleted

## 2015-04-20 DIAGNOSIS — C50412 Malignant neoplasm of upper-outer quadrant of left female breast: Secondary | ICD-10-CM

## 2015-04-20 MED ORDER — LETROZOLE 2.5 MG PO TABS: ORAL_TABLET | ORAL | Status: DC

## 2015-04-28 ENCOUNTER — Telehealth: Payer: Self-pay | Admitting: Internal Medicine

## 2015-04-28 ENCOUNTER — Encounter: Payer: Self-pay | Admitting: Internal Medicine

## 2015-04-28 ENCOUNTER — Ambulatory Visit (INDEPENDENT_AMBULATORY_CARE_PROVIDER_SITE_OTHER): Payer: Medicare Other | Admitting: Internal Medicine

## 2015-04-28 VITALS — BP 132/66 | HR 71 | Temp 98.5°F | Wt 97.0 lb

## 2015-04-28 DIAGNOSIS — J329 Chronic sinusitis, unspecified: Secondary | ICD-10-CM | POA: Diagnosis not present

## 2015-04-28 MED ORDER — AZITHROMYCIN 250 MG PO TABS: ORAL_TABLET | ORAL | Status: DC

## 2015-04-28 NOTE — Telephone Encounter (Signed)
Dr. Scott please advise.

## 2015-04-28 NOTE — Patient Instructions (Signed)
Saline nasal spray - flush nose at least 2-3x/day  nasacort nasal spray - 2 sprays each nostril one time per day.  Do this in the evening.    mucinex in the am and robitussin in the evening.  

## 2015-04-28 NOTE — Progress Notes (Signed)
Patient ID: Cassidy Martinez, female   DOB: 1931/06/18, 81 y.o.   MRN: [id number]   Subjective:    Patient ID: Cassidy Martinez, female    DOB: [birth year], 81 y.o.   MRN: [id number]  HPI  Patient with past history of hypercholesterolemia and breast cancer.  She comes in today as a work in with concerns regarding a possible sinus infection.  States symptoms started over a week ago.  Increased sinus pressure.  Yellow mucus production and blood tinged mucus.  Scratchy throat.  Increased throat congestion, from drainage.  No chest congestion.  No sob.  Cough.  Cough is productive.  Some blood tinged mucus.  Feels coming from drainage.  No chest congestion.  No sob.  No chest pain.  Eating and drinking.  No vomiting.  No diarrhea.  Increased stress with her husband's illness.  He recently had a heart attack.     Past Medical History  Diagnosis Date  . Hyperlipidemia   . PONV (postoperative nausea and vomiting)   . Arthritis     hands  . Heart murmur   . History of UTI     "have had a few over the years; haven't had once since ~ 2007"  . Carpal tunnel syndrome, bilateral   . Migraines     "used to have them severe; stopped when I took self off estrogen"  . Breast CA ([place])     left;; S/P mastectomy 08/12/11   Past Surgical History  Procedure Laterality Date  . Nasal sinus surgery    . Carpal tunnel release      right; "I've had it operated on twice"  . Scalp mass removal  2007    "knot; benign"  . Mastectomy complete / simple w/ sentinel node biopsy  08/12/11    axillary SNB; left  . Appendectomy    . Breast lumpectomy  03/2009    "abnormal cells"; right  . Abdominal hysterectomy  ~ 1983  . Eye surgery  2006    repair of tear in retina  . Mastectomy w/ sentinel node biopsy  08/12/2011    Procedure: MASTECTOMY WITH SENTINEL LYMPH NODE BIOPSY;  Surgeon: Adin Hector, MD;  Location: [facility];  Service: General;  Laterality: Left;   Family History  Problem Relation Age of Onset  . Stroke Mother     . Cancer Mother     ovarian  . Arthritis Father   . Cancer Maternal Aunt     breast  . Cancer Paternal Aunt     breast  . Anesthesia problems Neg Hx    Social History   Social History  . Marital Status: Married    Spouse Name: N/A  . Number of Children: 0  . Years of Education: N/A   Social History Main Topics  . Smoking status: Never Smoker   . Smokeless tobacco: Never Used  . Alcohol Use: No  . Drug Use: No  . Sexual Activity: Yes   Other Topics Concern  . None   Social History Narrative    Outpatient Encounter Prescriptions as of 04/28/2015  Medication Sig  . alendronate (FOSAMAX) 70 MG tablet Take 1 tablet (70 mg total) by mouth every 7 (seven) days. Take with a full glass of water on an empty stomach.  Marland Kitchen CALCIUM PO Take 1,000 mg by mouth daily.  . Cholecalciferol (VITAMIN D-3 PO) Take 1 capsule by mouth 2 (two) times daily.   . Emollient (CERAVE) LOTN   . letrozole [facility])  2.5 MG tablet TAKE 1 TABLET (2.5 MG TOTAL) BY MOUTH DAILY.  . Multiple Vitamins-Minerals (VISION VITAMINS PO) Take by mouth. Vision Essential  . triamcinolone cream (KENALOG) 0.1 % APPLY TOPICALLY 2 (TWO) TIMES DAILY.  Marland Kitchen azithromycin (ZITHROMAX) 250 MG tablet Take two tablets x 1 day and then one per day for four more days.   No facility-administered encounter medications on file as of 04/28/2015.    Review of Systems  Constitutional: Negative for fever and chills.  HENT: Positive for congestion, postnasal drip and sinus pressure.   Eyes: Negative for pain and discharge.  Respiratory: Positive for cough. Negative for chest tightness and shortness of breath.   Cardiovascular: Negative for chest pain and leg swelling.  Gastrointestinal: Negative for nausea, vomiting, abdominal pain and diarrhea.  Skin: Negative for color change and rash.  Neurological: Negative for dizziness, light-headedness and headaches.  Psychiatric/Behavioral:       Handling stress with her husband's medical issues.          Objective:    Physical Exam  Constitutional: She appears well-developed and well-nourished. No distress.  HENT:  Mouth/Throat: Oropharynx is clear and moist.  Nares - erythematous turbinates.    Eyes: Conjunctivae are normal. Right eye exhibits no discharge. Left eye exhibits no discharge.  Neck: Neck supple.  Cardiovascular: Normal rate and regular rhythm.   Pulmonary/Chest: Breath sounds normal. No respiratory distress. She has no wheezes.  Abdominal: Soft. Bowel sounds are normal.  Musculoskeletal: She exhibits no edema.  Lymphadenopathy:    She has no cervical adenopathy.  Skin: No rash noted. No pallor.  Psychiatric: She has a normal mood and affect. Her behavior is normal.    BP 132/66 mmHg  Pulse 71  Temp(Src) 98.5 F (36.9 C) (Oral)  Wt 97 lb (43.999 kg)  SpO2 97% Wt Readings from Last 3 Encounters:  04/28/15 97 lb (43.999 kg)  01/13/15 97 lb 2 oz (44.056 kg)  09/10/14 98 lb (44.453 kg)     Lab Results  Component Value Date   WBC 8.1 08/06/2014   HGB 13.6 08/06/2014   HCT 42.9 08/06/2014   PLT 194 08/06/2014   GLUCOSE 83 01/09/2015   CHOL 274* 01/09/2015   TRIG 80.0 01/09/2015   HDL 96.20 01/09/2015   LDLDIRECT 174.2 07/09/2013   LDLCALC 162* 01/09/2015   ALT 17 01/09/2015   AST 43* 01/09/2015   NA 140 01/09/2015   K 4.6 01/09/2015   CL 104 01/09/2015   CREATININE 1.00 01/09/2015   BUN 18 01/09/2015   CO2 29 01/09/2015   TSH 1.22 07/11/2014       Assessment & Plan:   Problem List Items Addressed This Visit    Sinusitis - Primary    Symptoms as outlined.  Appears to be c/w sinusitis/uri.  Treat with zpak as directed.  Saline nasal spray and nasacort nasal spray as directed.  mucinex and robitussin as directed.  Rest.  Fluids.  Follow.  Notify me if symptoms persists.       Relevant Medications   azithromycin (ZITHROMAX) 250 MG tablet       Einar Pheasant, MD

## 2015-04-28 NOTE — Progress Notes (Signed)
Pre visit review using our clinic review tool, if applicable. No additional management support is needed unless otherwise documented below in the visit note. 

## 2015-04-28 NOTE — Telephone Encounter (Signed)
Pt has been sick for about a week and her husband has had a heart attack. Pt states her weight has gone down to 90 pounds. Pt has chest congestion and a cold. No appt avail to sch. Pt just requested to speak to Dr Nicki Reaper. Thank You!

## 2015-04-28 NOTE — Telephone Encounter (Signed)
See if she can come in today at 4:15 to be worked in for this

## 2015-05-04 ENCOUNTER — Encounter: Payer: Self-pay | Admitting: Internal Medicine

## 2015-05-04 DIAGNOSIS — J329 Chronic sinusitis, unspecified: Secondary | ICD-10-CM | POA: Insufficient documentation

## 2015-05-04 NOTE — Assessment & Plan Note (Signed)
Symptoms as outlined.  Appears to be c/w sinusitis/uri.  Treat with zpak as directed.  Saline nasal spray and nasacort nasal spray as directed.  mucinex and robitussin as directed.  Rest.  Fluids.  Follow.  Notify me if symptoms persists.

## 2015-07-15 ENCOUNTER — Other Ambulatory Visit: Payer: Self-pay | Admitting: Internal Medicine

## 2015-08-10 NOTE — Assessment & Plan Note (Signed)
Stage II a T2, N0, M0 ER positive PR negative HER-2 negative Ki-67 13% status post mastectomy on the left breast 08/12/2011 currently on letrozole 2.5 mg daily  Letrozole toxicities: No major side effects to treatment, being monitored for osteoporosis currently takes calcium and vitamin D, Patient had a T score of -2.3 in 2013 and recently had another bone density recently.I recommended that she take Bisphosphonates and she will discuss with her PCP regarding this.  Breast cancer surveillance: 1. Breast exam September 2015 is normal  2. Right breast mammogram 07/24/2014 is normal  Return to clinic in 1 yr for follow-up

## 2015-08-11 ENCOUNTER — Encounter: Payer: Self-pay | Admitting: Hematology and Oncology

## 2015-08-11 ENCOUNTER — Ambulatory Visit (HOSPITAL_BASED_OUTPATIENT_CLINIC_OR_DEPARTMENT_OTHER): Payer: Medicare Other | Admitting: Hematology and Oncology

## 2015-08-11 ENCOUNTER — Telehealth: Payer: Self-pay

## 2015-08-11 VITALS — BP 129/55 | HR 55 | Temp 97.7°F | Resp 18 | Ht 64.0 in | Wt 96.9 lb

## 2015-08-11 DIAGNOSIS — Z79811 Long term (current) use of aromatase inhibitors: Secondary | ICD-10-CM

## 2015-08-11 DIAGNOSIS — C50412 Malignant neoplasm of upper-outer quadrant of left female breast: Secondary | ICD-10-CM | POA: Diagnosis not present

## 2015-08-11 DIAGNOSIS — Z170 Estrogen receptor positive status [ER+]: Secondary | ICD-10-CM | POA: Diagnosis not present

## 2015-08-11 NOTE — Telephone Encounter (Signed)
Spoke with patient and she is aware of her visit

## 2015-08-11 NOTE — Progress Notes (Signed)
Patient Care Team: Einar Pheasant, MD as PCP - General (Unknown Physician Specialty) Consuela Mimes, MD (Hematology and Oncology)  DIAGNOSIS: Primary cancer of upper outer quadrant of left female breast [facility])   Staging form: Breast, AJCC 7th Edition     Clinical: No stage assigned - Unsigned     Pathologic: Stage IIA (T2, N0, cM0) - Signed by Rulon Eisenmenger, MD on 02/11/2014  SUMMARY OF ONCOLOGIC HISTORY:   Primary cancer of upper outer quadrant of left female breast ([place])   08/12/2011 Surgery Left mastectomy 2.2 cm tumor ER 100% positive PR negative HER-2 negative Ki-67 13% . (Prior History of partial right mastectomy for atypical ductal hyperplasia October 2010)   09/01/2011 -  Anti-estrogen oral therapy  letrozole 2.5 mg daily    CHIEF COMPLIANT:  Follow-up of breast cancer on letrozole  INTERVAL HISTORY: Cassidy Martinez is a  82 year old with above-mentioned history of left breast cancer who had left mastectomy and is currently on letrozole therapy. She is been on it for the past 4 years. She reports no major problems or concerns. Denies any hot flashes or myalgias. Her husband recently had a heart attack and is recovering from it. She denies any new lumps or nodules in the breasts.  REVIEW OF SYSTEMS:   Constitutional: Denies fevers, chills or abnormal weight loss Eyes: Denies blurriness of vision Ears, nose, mouth, throat, and face: Denies mucositis or sore throat Respiratory: Denies cough, dyspnea or wheezes Cardiovascular: Denies palpitation, chest discomfort Gastrointestinal:  Denies nausea, heartburn or change in bowel habits Skin: Denies abnormal skin rashes Lymphatics: Denies new lymphadenopathy or easy bruising Neurological:Denies numbness, tingling or new weaknesses Behavioral/Psych: Mood is stable, no new changes  Extremities: No lower extremity edema Breast:  denies any pain or lumps or nodules in either breasts All other systems were reviewed with the patient and are  negative.  I have reviewed the past medical history, past surgical history, social history and family history with the patient and they are unchanged from previous note.  ALLERGIES:  is allergic to sulfa antibiotics; macrobid; and penicillins.  MEDICATIONS:  Current Outpatient Prescriptions  Medication Sig Dispense Refill  . alendronate (FOSAMAX) 70 MG tablet TAKE 1 TABLET BY MOUTH EVERY 7 DAYS WITHA FULL GLASS OF WATER ON AN EMPTY STOMACH. 4 tablet 5  . CALCIUM PO Take 1,000 mg by mouth daily.    . Cholecalciferol (VITAMIN D-3 PO) Take 1 capsule by mouth 2 (two) times daily.     . Emollient (CERAVE) LOTN     . letrozole (FEMARA) 2.5 MG tablet TAKE 1 TABLET (2.5 MG TOTAL) BY MOUTH DAILY. 90 tablet 3  . Multiple Vitamins-Minerals (VISION VITAMINS PO) Take by mouth. Vision Essential    . Red Yeast Rice Extract 600 MG CAPS Take 600 mg by mouth.    . triamcinolone cream (KENALOG) 0.1 % APPLY TOPICALLY 2 (TWO) TIMES DAILY.  0   No current facility-administered medications for this visit.    PHYSICAL EXAMINATION: ECOG PERFORMANCE STATUS: 0 - Asymptomatic  Filed Vitals:   08/11/15 1026  BP: 129/55  Pulse: 55  Temp: 97.7 F (36.5 C)  Resp: 18   Filed Weights   08/11/15 1026  Weight: 96 lb 14.4 oz (43.954 kg)    GENERAL:alert, no distress and comfortable SKIN: skin color, texture, turgor are normal, no rashes or significant lesions EYES: normal, Conjunctiva are pink and non-injected, sclera clear OROPHARYNX:no exudate, no erythema and lips, buccal mucosa, and tongue normal  NECK: supple,  thyroid normal size, non-tender, without nodularity LYMPH:  no palpable lymphadenopathy in the cervical, axillary or inguinal LUNGS: clear to auscultation and percussion with normal breathing effort HEART: regular rate & rhythm and no murmurs and no lower extremity edema ABDOMEN:abdomen soft, non-tender and normal bowel sounds MUSCULOSKELETAL:no cyanosis of digits and no clubbing  NEURO: alert &  oriented x 3 with fluent speech, no focal motor/sensory deficits EXTREMITIES: No lower extremity edema BREAST: No palpable masses or nodules in either right or left breasts. No palpable axillary supraclavicular or infraclavicular adenopathy no breast tenderness or nipple discharge. (exam performed in the presence of a chaperone)  LABORATORY DATA:  I have reviewed the data as listed   Chemistry      Component Value Date/Time   NA 140 01/09/2015 0815   NA 141 08/06/2014 1319   K 4.6 01/09/2015 0815   K 4.4 08/06/2014 1319   CL 104 01/09/2015 0815   CL 103 07/05/2012 0913   CO2 29 01/09/2015 0815   CO2 26 08/06/2014 1319   BUN 18 01/09/2015 0815   BUN 24.9 08/06/2014 1319   CREATININE 1.00 01/09/2015 0815   CREATININE 1.1 08/06/2014 1319      Component Value Date/Time   CALCIUM 9.6 01/09/2015 0815   CALCIUM 9.6 08/06/2014 1319   ALKPHOS 68 01/09/2015 0815   ALKPHOS 103 08/06/2014 1319   AST 43* 01/09/2015 0815   AST 40* 08/06/2014 1319   ALT 17 01/09/2015 0815   ALT 17 08/06/2014 1319   BILITOT 0.6 01/09/2015 0815   BILITOT 0.41 08/06/2014 1319       Lab Results  Component Value Date   WBC 8.1 08/06/2014   HGB 13.6 08/06/2014   HCT 42.9 08/06/2014   MCV 91.3 08/06/2014   PLT 194 08/06/2014   NEUTROABS 5.4 08/06/2014   ASSESSMENT & PLAN:  Primary cancer of upper outer quadrant of left female breast Stage II a T2, N0, M0 ER positive PR negative HER-2 negative Ki-67 13% status post mastectomy on the left breast 08/12/2011 currently on letrozole 2.5 mg daily  Letrozole toxicities: No major side effects to treatment, being monitored for osteoporosis currently takes calcium and vitamin D, Patient had a T score of -2.3 in 2013 and recently had another bone density recently.I recommended that she take Bisphosphonates and she will discuss with her PCP regarding this.  Breast cancer surveillance: 1. Breast exam September 2015 is normal  2. Right breast mammogram 07/24/2014  is normal  Return to clinic in 1 yr for follow-up   No orders of the defined types were placed in this encounter.   The patient has a good understanding of the overall plan. she agrees with it. she will call with any problems that may develop before the next visit here.   Rulon Eisenmenger, MD 08/11/2015

## 2015-08-20 ENCOUNTER — Encounter: Payer: Self-pay | Admitting: Internal Medicine

## 2015-08-20 ENCOUNTER — Ambulatory Visit (INDEPENDENT_AMBULATORY_CARE_PROVIDER_SITE_OTHER): Payer: Medicare Other | Admitting: Internal Medicine

## 2015-08-20 VITALS — BP 120/70 | HR 62 | Temp 97.9°F | Resp 18 | Ht 64.0 in | Wt 96.5 lb

## 2015-08-20 DIAGNOSIS — Z1239 Encounter for other screening for malignant neoplasm of breast: Secondary | ICD-10-CM

## 2015-08-20 DIAGNOSIS — E7800 Pure hypercholesterolemia, unspecified: Secondary | ICD-10-CM | POA: Diagnosis not present

## 2015-08-20 DIAGNOSIS — F439 Reaction to severe stress, unspecified: Secondary | ICD-10-CM

## 2015-08-20 DIAGNOSIS — K7689 Other specified diseases of liver: Secondary | ICD-10-CM

## 2015-08-20 DIAGNOSIS — R945 Abnormal results of liver function studies: Secondary | ICD-10-CM

## 2015-08-20 DIAGNOSIS — C50412 Malignant neoplasm of upper-outer quadrant of left female breast: Secondary | ICD-10-CM | POA: Diagnosis not present

## 2015-08-20 DIAGNOSIS — Z658 Other specified problems related to psychosocial circumstances: Secondary | ICD-10-CM

## 2015-08-20 DIAGNOSIS — R21 Rash and other nonspecific skin eruption: Secondary | ICD-10-CM

## 2015-08-20 NOTE — Progress Notes (Signed)
Patient ID: Cassidy Martinez, female   DOB: [birth year], 82 y.o.   MRN: [id number]   Subjective:    Patient ID: Cassidy Martinez, female    DOB: [birth year], 82 y.o.   MRN: [id number]  HPI  Patient with past history of hypercholesterolemia and breast cancer.  She comes in today for a scheduled follow up.  She has been under increased stress with her husband's medical issues.  She feel she is coping relatively well.  Desires no further intervention at this time.  Stays active.  No cardiac symptoms with increased activity or exertion.  No sob.  No acid reflux.  No abdominal pain or cramping.  Bowels stable.   Rash - left lower leg.     Past Medical History  Diagnosis Date  . Hyperlipidemia   . PONV (postoperative nausea and vomiting)   . Arthritis     hands  . Heart murmur   . History of UTI     "have had a few over the years; haven't had once since ~ 2007"  . Carpal tunnel syndrome, bilateral   . Migraines     "used to have them severe; stopped when I took self off estrogen"  . Breast CA ([place])     left;; S/P mastectomy 08/12/11   Past Surgical History  Procedure Laterality Date  . Nasal sinus surgery    . Carpal tunnel release      right; "I've had it operated on twice"  . Scalp mass removal  2007    "knot; benign"  . Mastectomy complete / simple w/ sentinel node biopsy  08/12/11    axillary SNB; left  . Appendectomy    . Breast lumpectomy  03/2009    "abnormal cells"; right  . Abdominal hysterectomy  ~ 1983  . Eye surgery  2006    repair of tear in retina  . Mastectomy w/ sentinel node biopsy  08/12/2011    Procedure: MASTECTOMY WITH SENTINEL LYMPH NODE BIOPSY;  Surgeon: Adin Hector, MD;  Location: [facility];  Service: General;  Laterality: Left;   Family History  Problem Relation Age of Onset  . Stroke Mother   . Cancer Mother     ovarian  . Arthritis Father   . Cancer Maternal Aunt     breast  . Cancer Paternal Aunt     breast  . Anesthesia problems Neg Hx    Social History    Social History  . Marital Status: Married    Spouse Name: N/A  . Number of Children: 0  . Years of Education: N/A   Social History Main Topics  . Smoking status: Never Smoker   . Smokeless tobacco: Never Used  . Alcohol Use: No  . Drug Use: No  . Sexual Activity: Yes   Other Topics Concern  . None   Social History Narrative    Outpatient Encounter Prescriptions as of 08/20/2015  Medication Sig  . alendronate (FOSAMAX) 70 MG tablet TAKE 1 TABLET BY MOUTH EVERY 7 DAYS WITHA FULL GLASS OF WATER ON AN EMPTY STOMACH.  Marland Kitchen CALCIUM PO Take 1,000 mg by mouth daily.  . Cholecalciferol (VITAMIN D-3 PO) Take 1 capsule by mouth 2 (two) times daily.   . Emollient (CERAVE) LOTN   . letrozole (FEMARA) 2.5 MG tablet TAKE 1 TABLET (2.5 MG TOTAL) BY MOUTH DAILY.  . Multiple Vitamins-Minerals (VISION VITAMINS PO) Take by mouth. Vision Essential  . Red Yeast Rice Extract 600 MG CAPS Take 600 mg by  mouth.  . triamcinolone cream (KENALOG) 0.1 % APPLY TOPICALLY 2 (TWO) TIMES DAILY.   No facility-administered encounter medications on file as of 08/20/2015.    Review of Systems  Constitutional: Negative for appetite change and unexpected weight change.  HENT: Negative for congestion and sinus pressure.   Respiratory: Negative for cough, chest tightness and shortness of breath.   Cardiovascular: Negative for chest pain, palpitations and leg swelling.  Gastrointestinal: Negative for nausea, vomiting, abdominal pain and diarrhea.  Genitourinary: Negative for dysuria and difficulty urinating.  Musculoskeletal: Negative for back pain and joint swelling.  Skin: Negative for color change and rash.  Neurological: Negative for dizziness, light-headedness and headaches.  Psychiatric/Behavioral: Negative for dysphoric mood and agitation.       Objective:    Physical Exam  Constitutional: She appears well-developed and well-nourished. No distress.  HENT:  Nose: Nose normal.  Mouth/Throat: Oropharynx is  clear and moist.  Eyes: Conjunctivae are normal. Right eye exhibits no discharge. Left eye exhibits no discharge.  Neck: Neck supple. No thyromegaly present.  Cardiovascular: Normal rate and regular rhythm.   Pulmonary/Chest: Breath sounds normal. No respiratory distress. She has no wheezes.  Abdominal: Soft. Bowel sounds are normal. There is no tenderness.  Musculoskeletal: She exhibits no edema or tenderness.  Lymphadenopathy:    She has no cervical adenopathy.  Skin: No rash noted. No erythema.  Psychiatric: She has a normal mood and affect. Her behavior is normal.    BP 120/70 mmHg  Pulse 62  Temp(Src) 97.9 F (36.6 C) (Oral)  Resp 18  Ht 5\' 4"  (1.626 m)  Wt 96 lb 8 oz (43.772 kg)  BMI 16.56 kg/m2  SpO2 94% Wt Readings from Last 3 Encounters:  08/20/15 96 lb 8 oz (43.772 kg)  08/11/15 96 lb 14.4 oz (43.954 kg)  04/28/15 97 lb (43.999 kg)     Lab Results  Component Value Date   WBC 8.1 08/06/2014   HGB 13.6 08/06/2014   HCT 42.9 08/06/2014   PLT 194 08/06/2014   GLUCOSE 83 01/09/2015   CHOL 274* 01/09/2015   TRIG 80.0 01/09/2015   HDL 96.20 01/09/2015   LDLDIRECT 174.2 07/09/2013   LDLCALC 162* 01/09/2015   ALT 17 01/09/2015   AST 43* 01/09/2015   NA 140 01/09/2015   K 4.6 01/09/2015   CL 104 01/09/2015   CREATININE 1.00 01/09/2015   BUN 18 01/09/2015   CO2 29 01/09/2015   TSH 1.22 07/11/2014       Assessment & Plan:   Problem List Items Addressed This Visit    Abnormal liver function    Worked up by Dr Tiffany Kocher.  Follow liver function tests.        Hypercholesterolemia    Low cholesterol diet and exercise.  Follow lipid panel.  Desires not to take cholesterol medication.  Follow.        Primary cancer of upper outer quadrant of left female breast ([place])    Followed by oncology.  On femara.  Scheduled for mammogram.        Rash    Rash on lower leg.  TCC .1%.        Stress    Increased stress as outlined.  Feels she is handling things  relatively well.  Desires no further intervention.  Has good support.         Other Visit Diagnoses    Screening breast examination    -  Primary        Chiquetta Langner,  Randell Patient, MD

## 2015-08-20 NOTE — Progress Notes (Signed)
Pre-visit discussion using our clinic review tool. No additional management support is needed unless otherwise documented below in the visit note.  

## 2015-08-21 ENCOUNTER — Other Ambulatory Visit: Payer: Self-pay | Admitting: Internal Medicine

## 2015-08-21 DIAGNOSIS — Z1231 Encounter for screening mammogram for malignant neoplasm of breast: Secondary | ICD-10-CM

## 2015-08-24 ENCOUNTER — Ambulatory Visit (INDEPENDENT_AMBULATORY_CARE_PROVIDER_SITE_OTHER): Payer: Medicare Other

## 2015-08-24 ENCOUNTER — Ambulatory Visit: Payer: Medicare Other

## 2015-08-24 ENCOUNTER — Encounter: Payer: Self-pay | Admitting: Internal Medicine

## 2015-08-24 VITALS — BP 128/60 | HR 60 | Temp 97.4°F | Resp 14 | Ht 64.0 in | Wt 97.4 lb

## 2015-08-24 DIAGNOSIS — R21 Rash and other nonspecific skin eruption: Secondary | ICD-10-CM | POA: Insufficient documentation

## 2015-08-24 DIAGNOSIS — Z0000 Encounter for general adult medical examination without abnormal findings: Secondary | ICD-10-CM | POA: Diagnosis not present

## 2015-08-24 DIAGNOSIS — F439 Reaction to severe stress, unspecified: Secondary | ICD-10-CM | POA: Insufficient documentation

## 2015-08-24 NOTE — Patient Instructions (Addendum)
Cassidy Martinez , Thank you for taking time to come for your Medicare Wellness Visit. I appreciate your ongoing commitment to your health goals. Please review the following plan we discussed and let me know if I can assist you in the future.     This is a list of the screening recommended for you and due dates:  Health Maintenance  Topic Date Due  . Pneumonia vaccines (1 of 2 - PCV13) 08/23/2016*  . Shingles Vaccine  07/15/2024*  . Tetanus Vaccine  07/15/2024*  . DEXA scan (bone density measurement)  07/29/2016  . Mammogram  08/23/2016  *Topic was postponed. The date shown is not the original due date.    Mammogram A mammogram is an X-ray of the breasts that is done to check for abnormal changes. This procedure can screen for and detect any changes that may suggest breast cancer. A mammogram can also identify other changes and variations in the breast, such as:  Inflammation of the breast tissue (mastitis).  An infected area that contains a collection of pus (abscess).  A fluid-filled sac (cyst).  Fibrocystic changes. This is when breast tissue becomes denser, which can make the tissue feel rope-like or uneven under the skin.  Tumors that are not cancerous (benign). LET [facility] CARE PROVIDER KNOW ABOUT:  Any allergies you have.  If you have breast implants.  If you have had previous breast disease, biopsy, or surgery.  If you are breastfeeding.  Any possibility that you could be pregnant, if this applies.  If you are younger than age 14.  If you have a family history of breast cancer. RISKS AND COMPLICATIONS Generally, this is a safe procedure. However, problems may occur, including:  Exposure to radiation. Radiation levels are very low with this test.  The results being misinterpreted.  The need for further tests.  The inability of the mammogram to detect certain cancers. BEFORE THE PROCEDURE  Schedule your test about 1-2 weeks after your menstrual period. This  is usually when your breasts are the least tender.  If you have had a mammogram done at a different facility in the past, get the mammogram X-rays or have them sent to your current exam facility in order to compare them.  Wash your breasts and under your arms the day of the test.  Do not wear deodorants, perfumes, lotions, or powders anywhere on your body on the day of the test.  Remove any jewelry from your neck.  Wear clothes that you can change into and out of easily. PROCEDURE  You will undress from the waist up and put on a gown.  You will stand in front of the X-ray machine.  Each breast will be placed between two plastic or glass plates. The plates will compress your breast for a few seconds. Try to stay as relaxed as possible during the procedure. This does not cause any harm to your breasts and any discomfort you feel will be very brief.  X-rays will be taken from different angles of each breast. The procedure may vary among health care providers and hospitals. AFTER THE PROCEDURE  The mammogram will be examined by a specialist (radiologist).  You may need to repeat certain parts of the test, depending on the quality of the images. This is commonly done if the radiologist needs a better view of the breast tissue.  Ask when your test results will be ready. Make sure you get your test results.  You may resume your normal activities.  This information is not intended to replace advice given to you by your health care provider. Make sure you discuss any questions you have with your health care provider.   Document Released: 05/27/2000 Document Revised: 02/18/2015 Document Reviewed: 08/08/2014 Elsevier Interactive Patient Education [employer].

## 2015-08-24 NOTE — Assessment & Plan Note (Signed)
Rash on lower leg.  TCC .1%.

## 2015-08-24 NOTE — Assessment & Plan Note (Signed)
Low cholesterol diet and exercise.  Follow lipid panel.  Desires not to take cholesterol medication.  Follow.

## 2015-08-24 NOTE — Progress Notes (Signed)
Subjective:   Cassidy Martinez is a 82 y.o. female who presents for Medicare Annual (Subsequent) preventive examination.  Review of Systems:  No ROS.  Medicare Wellness Visit.  Cardiac Risk Factors include: advanced age (>35mn, >>46women)     Objective:     Vitals: BP 128/60 mmHg  Pulse 60  Temp(Src) 97.4 F (36.3 C) (Oral)  Resp 14  Ht '5\' 4"'  (1.626 m)  Wt 97 lb 6.4 oz (44.18 kg)  BMI 16.71 kg/m2  SpO2 95%  Tobacco History  Smoking status  . Never Smoker   Smokeless tobacco  . Never Used     Counseling given: Not Answered   Past Medical History  Diagnosis Date  . Hyperlipidemia   . PONV (postoperative nausea and vomiting)   . Arthritis     hands  . Heart murmur   . History of UTI     "have had a few over the years; haven't had once since ~ 2007"  . Carpal tunnel syndrome, bilateral   . Migraines     "used to have them severe; stopped when I took self off estrogen"  . Breast CA ([place]     left;; S/P mastectomy 08/12/11   Past Surgical History  Procedure Laterality Date  . Nasal sinus surgery    . Carpal tunnel release      right; "I've had it operated on twice"  . Scalp mass removal  2007    "knot; benign"  . Mastectomy complete / simple w/ sentinel node biopsy  08/12/11    axillary SNB; left  . Appendectomy    . Breast lumpectomy  03/2009    "abnormal cells"; right  . Abdominal hysterectomy  ~ 1983  . Eye surgery  2006    repair of tear in retina  . Mastectomy w/ sentinel node biopsy  08/12/2011    Procedure: MASTECTOMY WITH SENTINEL LYMPH NODE BIOPSY;  Surgeon: HAdin Hector MD;  [place] [place]  Service: General;  Laterality: Left;   Family History  Problem Relation Age of Onset  . Stroke Mother   . Cancer Mother     ovarian  . Arthritis Father   . Cancer Maternal Aunt     breast  . Cancer Paternal Aunt     breast  . Anesthesia problems Neg Hx    History  Sexual Activity  . Sexual Activity: Yes    Outpatient Encounter Prescriptions  as of 08/24/2015  Medication Sig  . alendronate (FOSAMAX) 70 MG tablet TAKE 1 TABLET BY MOUTH EVERY 7 DAYS WITHA FULL GLASS OF WATER ON AN EMPTY STOMACH.  .Marland KitchenCALCIUM PO Take 1,000 mg by mouth daily.  . Cholecalciferol (VITAMIN D-3 PO) Take 1 capsule by mouth 2 (two) times daily.   . Emollient (CERAVE) LOTN   . letrozole (FEMARA) 2.5 MG tablet TAKE 1 TABLET (2.5 MG TOTAL) BY MOUTH DAILY.  . Multiple Vitamins-Minerals (VISION VITAMINS PO) Take by mouth. Vision Essential  . Red Yeast Rice Extract 600 MG CAPS Take 600 mg by mouth.  . triamcinolone cream (KENALOG) 0.1 % APPLY TOPICALLY 2 (TWO) TIMES DAILY.   No facility-administered encounter medications on file as of 08/24/2015.    Activities of Daily Living In your present state of health, do you have any difficulty performing the following activities: 08/24/2015  Hearing? N  Vision? N  Difficulty concentrating or making decisions? N  Walking or climbing stairs? N  Dressing or bathing? N  Doing errands, shopping? N  Preparing  Food and eating ? N  Using the Toilet? N  In the past six months, have you accidently leaked urine? N  Do you have problems with loss of bowel control? N  Managing your Medications? N  Managing your Finances? N  Housekeeping or managing your Housekeeping? N    Patient Care Team: Einar Pheasant, MD as PCP - General (Unknown Physician Specialty) Consuela Mimes, MD (Hematology and Oncology)    Assessment:    This is a routine wellness examination for Cassidy Martinez. The goal of the wellness visit is to assist the patient how to close the gaps in care and create a preventative care plan for the patient.   Taking VIT D3 Calcium as appropriate/Osteoporosis risk reviewed.  Medications reviewed; taking without issues or barriers.  Safety issues reviewed; smoke detectors in the home. No firearms in the home.  Wears seatbelts when driving or riding with others. No violence in the home.  No identified risk were noted; The  patient was oriented x 3; appropriate in dress and manner and no objective failures at ADL's or IADL's.   Dis porphyrin metab-stable and followed by PCP.  Patient Concerns:  None at this time.  Follow up with PCP as needed.   Exercise Activities and Dietary recommendations Current Exercise Habits: Home exercise routine (Does yard work), Type of exercise: walking, Time (Minutes): 30, Intensity: Mild  Goals    . Healthy Lifestyle     Stay hydrated! Drink plenty of fluids. Choose lean meats, fruits and vegetables    . Increase physical activity     Start attending Tai-Chi 2-3 times weekly classes when possible      Fall Risk Fall Risk  08/24/2015 07/15/2014 02/11/2014 07/11/2013 07/08/2012  Falls in the past year? No No No Yes No  Number falls in past yr: - - - 1 -  Injury with Fall? - - - No -   Depression Screen PHQ 2/9 Scores 08/24/2015 07/15/2014 07/11/2013 07/08/2012  PHQ - 2 Score 0 1 0 0     Cognitive Testing MMSE - Mini Mental State Exam 08/24/2015  Orientation to time 5  Orientation to Place 5  Registration 3  Attention/ Calculation 5  Recall 3  Language- name 2 objects 2  Language- repeat 1  Language- follow 3 step command 3  Language- read & follow direction 1  Write a sentence 1  Copy design 1  Total score 30     There is no immunization history on file for this patient. Screening Tests Health Maintenance  Topic Date Due  . PNA vac Low Risk Adult (1 of 2 - PCV13) 08/23/2016 (Originally 11/20/1998)  . ZOSTAVAX  07/15/2024 (Originally 11/19/1993)  . TETANUS/TDAP  07/15/2024 (Originally 11/19/1950)  . DEXA SCAN  07/29/2016  . MAMMOGRAM  08/23/2016      Plan:   End of life planning; Advance aging; Advanced directives discussed. Copy of current HCPOA/Living Will requested.   During the course of the visit the patient was educated and counseled about the following appropriate screening and preventive services:   Vaccines to include Pneumoccal, Influenza, Hepatitis B,  Td, Zostavax, HCV  Electrocardiogram  Cardiovascular Disease  Colorectal cancer screening  Bone density screening  Diabetes screening  Glaucoma screening  Mammography/PAP  Nutrition counseling   Patient Instructions (the written plan) was given to the patient.   Varney Biles, LPN  10/05/9561   Reviewed above information.  Agree with plan.    Dr Nicki Reaper

## 2015-08-24 NOTE — Assessment & Plan Note (Signed)
Worked up by Dr Tiffany Kocher.  Follow liver function tests.

## 2015-08-24 NOTE — Assessment & Plan Note (Signed)
Followed by oncology.  On femara.  Scheduled for mammogram.

## 2015-08-24 NOTE — Assessment & Plan Note (Signed)
Increased stress as outlined.  Feels she is handling things relatively well.  Desires no further intervention.  Has good support.

## 2015-08-31 ENCOUNTER — Ambulatory Visit
Admission: RE | Admit: 2015-08-31 | Discharge: 2015-08-31 | Disposition: A | Discharge status: Home / Self Care | Payer: Medicare Other | Source: Ambulatory Visit | Attending: Internal Medicine | Admitting: Internal Medicine

## 2015-08-31 DIAGNOSIS — Z1231 Encounter for screening mammogram for malignant neoplasm of breast: Secondary | ICD-10-CM

## 2015-09-02 DIAGNOSIS — M810 Age-related osteoporosis without current pathological fracture: Secondary | ICD-10-CM | POA: Diagnosis not present

## 2015-09-02 DIAGNOSIS — C50912 Malignant neoplasm of unspecified site of left female breast: Secondary | ICD-10-CM | POA: Diagnosis not present

## 2015-09-02 DIAGNOSIS — Z9012 Acquired absence of left breast and nipple: Secondary | ICD-10-CM | POA: Diagnosis not present

## 2015-09-03 ENCOUNTER — Other Ambulatory Visit (INDEPENDENT_AMBULATORY_CARE_PROVIDER_SITE_OTHER): Payer: Medicare Other

## 2015-09-03 DIAGNOSIS — C50412 Malignant neoplasm of upper-outer quadrant of left female breast: Secondary | ICD-10-CM | POA: Diagnosis not present

## 2015-09-03 DIAGNOSIS — R634 Abnormal weight loss: Secondary | ICD-10-CM

## 2015-09-03 DIAGNOSIS — E7800 Pure hypercholesterolemia, unspecified: Secondary | ICD-10-CM

## 2015-09-03 DIAGNOSIS — R945 Abnormal results of liver function studies: Secondary | ICD-10-CM

## 2015-09-03 DIAGNOSIS — M810 Age-related osteoporosis without current pathological fracture: Secondary | ICD-10-CM | POA: Diagnosis not present

## 2015-09-03 DIAGNOSIS — K7689 Other specified diseases of liver: Secondary | ICD-10-CM | POA: Diagnosis not present

## 2015-09-03 LAB — CBC WITH DIFFERENTIAL/PLATELET
BASOS PCT: 0.8 % (ref 0.0–3.0)
Basophils Absolute: 0.1 10*3/uL (ref 0.0–0.1)
EOS ABS: 0.2 10*3/uL (ref 0.0–0.7)
Eosinophils Relative: 3 % (ref 0.0–5.0)
HCT: 43.4 % (ref 36.0–46.0)
HEMOGLOBIN: 14.5 g/dL (ref 12.0–15.0)
Lymphocytes Relative: 20.9 % (ref 12.0–46.0)
Lymphs Abs: 1.6 10*3/uL (ref 0.7–4.0)
MCHC: 33.4 g/dL (ref 30.0–36.0)
MCV: 89 fl (ref 78.0–100.0)
MONO ABS: 0.6 10*3/uL (ref 0.1–1.0)
Monocytes Relative: 8.2 % (ref 3.0–12.0)
Neutro Abs: 5.1 10*3/uL (ref 1.4–7.7)
Neutrophils Relative %: 67.1 % (ref 43.0–77.0)
PLATELETS: 212 10*3/uL (ref 150.0–400.0)
RBC: 4.87 Mil/uL (ref 3.87–5.11)
RDW: 13.9 % (ref 11.5–15.5)
WBC: 7.6 10*3/uL (ref 4.0–10.5)

## 2015-09-03 LAB — BASIC METABOLIC PANEL
BUN: 27 mg/dL — ABNORMAL HIGH (ref 6–23)
CO2: 30 meq/L (ref 19–32)
Calcium: 9.7 mg/dL (ref 8.4–10.5)
Chloride: 105 mEq/L (ref 96–112)
Creatinine, Ser: 1.05 mg/dL (ref 0.40–1.20)
GFR: 53.36 mL/min — ABNORMAL LOW (ref >60.00)
Glucose, Bld: 81 mg/dL (ref 70–99)
POTASSIUM: 4.1 meq/L (ref 3.5–5.1)
Sodium: 142 mEq/L (ref 135–145)

## 2015-09-03 LAB — LIPID PANEL
CHOL/HDL RATIO: 3
Cholesterol: 317 mg/dL — ABNORMAL HIGH (ref 0–200)
HDL: 99.4 mg/dL (ref >39.00)
LDL CALC: 201 mg/dL — AB (ref 0–99)
NonHDL: 217.15
TRIGLYCERIDES: 80 mg/dL (ref 0.0–149.0)
VLDL: 16 mg/dL (ref 0.0–40.0)

## 2015-09-03 LAB — HEPATIC FUNCTION PANEL
ALT: 22 U/L (ref 0–35)
AST: 46 U/L — ABNORMAL HIGH (ref 0–37)
Albumin: 4.1 g/dL (ref 3.5–5.2)
Alkaline Phosphatase: 60 U/L (ref 39–117)
BILIRUBIN DIRECT: 0.1 mg/dL (ref 0.0–0.3)
BILIRUBIN TOTAL: 0.7 mg/dL (ref 0.2–1.2)
Total Protein: 6.7 g/dL (ref 6.0–8.3)

## 2015-09-04 LAB — TSH: TSH: 0.94 u[IU]/mL (ref 0.35–4.50)

## 2015-09-04 LAB — VITAMIN D 25 HYDROXY (VIT D DEFICIENCY, FRACTURES): VITD: 50.47 ng/mL (ref 30.00–100.00)

## 2015-09-06 ENCOUNTER — Other Ambulatory Visit: Payer: Self-pay | Admitting: Internal Medicine

## 2015-09-06 DIAGNOSIS — R945 Abnormal results of liver function studies: Secondary | ICD-10-CM

## 2015-09-06 DIAGNOSIS — R7989 Other specified abnormal findings of blood chemistry: Secondary | ICD-10-CM

## 2015-09-06 NOTE — Progress Notes (Signed)
Order placed for f/u liver panel.  

## 2015-09-07 ENCOUNTER — Other Ambulatory Visit: Payer: Self-pay | Admitting: *Deleted

## 2015-09-07 MED ORDER — ROSUVASTATIN CALCIUM 5 MG PO TABS: 5.0000 mg | ORAL_TABLET | Freq: Every day | ORAL | Status: DC

## 2015-09-07 NOTE — Telephone Encounter (Signed)
New Rx sent to pharmacy-see lab result

## 2015-10-06 DIAGNOSIS — L237 Allergic contact dermatitis due to plants, except food: Secondary | ICD-10-CM | POA: Diagnosis not present

## 2015-10-07 ENCOUNTER — Other Ambulatory Visit (INDEPENDENT_AMBULATORY_CARE_PROVIDER_SITE_OTHER): Payer: Medicare Other

## 2015-10-07 DIAGNOSIS — R945 Abnormal results of liver function studies: Secondary | ICD-10-CM

## 2015-10-07 DIAGNOSIS — R7989 Other specified abnormal findings of blood chemistry: Secondary | ICD-10-CM

## 2015-10-07 LAB — HEPATIC FUNCTION PANEL
ALBUMIN: 4.2 g/dL (ref 3.5–5.2)
ALK PHOS: 58 U/L (ref 39–117)
ALT: 20 U/L (ref 0–35)
AST: 44 U/L — AB (ref 0–37)
BILIRUBIN TOTAL: 0.8 mg/dL (ref 0.2–1.2)
Bilirubin, Direct: 0.1 mg/dL (ref 0.0–0.3)
Total Protein: 6.7 g/dL (ref 6.0–8.3)

## 2015-10-08 ENCOUNTER — Other Ambulatory Visit: Payer: Self-pay | Admitting: Internal Medicine

## 2015-10-08 ENCOUNTER — Encounter: Payer: Self-pay | Admitting: *Deleted

## 2015-10-08 ENCOUNTER — Other Ambulatory Visit: Payer: Self-pay

## 2015-10-08 DIAGNOSIS — R7989 Other specified abnormal findings of blood chemistry: Secondary | ICD-10-CM

## 2015-10-08 DIAGNOSIS — R945 Abnormal results of liver function studies: Secondary | ICD-10-CM

## 2015-10-08 NOTE — Progress Notes (Signed)
Order placed for f/u lab.   

## 2015-10-16 ENCOUNTER — Telehealth: Payer: Self-pay | Admitting: *Deleted

## 2015-10-21 DIAGNOSIS — M722 Plantar fascial fibromatosis: Secondary | ICD-10-CM | POA: Diagnosis not present

## 2015-10-21 DIAGNOSIS — M79675 Pain in left toe(s): Secondary | ICD-10-CM | POA: Diagnosis not present

## 2015-11-05 ENCOUNTER — Other Ambulatory Visit (INDEPENDENT_AMBULATORY_CARE_PROVIDER_SITE_OTHER): Payer: Medicare Other

## 2015-11-05 DIAGNOSIS — R7989 Other specified abnormal findings of blood chemistry: Secondary | ICD-10-CM

## 2015-11-05 DIAGNOSIS — R945 Abnormal results of liver function studies: Secondary | ICD-10-CM

## 2015-11-05 LAB — HEPATIC FUNCTION PANEL
ALT: 21 U/L (ref 0–35)
AST: 45 U/L — AB (ref 0–37)
Albumin: 4 g/dL (ref 3.5–5.2)
Alkaline Phosphatase: 56 U/L (ref 39–117)
BILIRUBIN DIRECT: 0.1 mg/dL (ref 0.0–0.3)
TOTAL PROTEIN: 6.4 g/dL (ref 6.0–8.3)
Total Bilirubin: 0.7 mg/dL (ref 0.2–1.2)

## 2015-11-06 ENCOUNTER — Telehealth: Payer: Self-pay | Admitting: *Deleted

## 2015-11-06 ENCOUNTER — Other Ambulatory Visit: Payer: Self-pay | Admitting: Internal Medicine

## 2015-11-06 DIAGNOSIS — E7800 Pure hypercholesterolemia, unspecified: Secondary | ICD-10-CM

## 2015-11-06 DIAGNOSIS — R945 Abnormal results of liver function studies: Secondary | ICD-10-CM

## 2015-11-06 NOTE — Telephone Encounter (Signed)
Yes a medical clearance letter for exercise

## 2015-11-06 NOTE — Telephone Encounter (Signed)
Does she just need a letter clearing her medically - that she is ok to exercise and that there is no contraindication.  Just need clarification.  Have never had to give letter for spouse participation previously.

## 2015-11-06 NOTE — Progress Notes (Signed)
Orders placed for f/u labs.  

## 2015-11-06 NOTE — Telephone Encounter (Signed)
Patient's husband recently had a heart attack.  He is now doing PT.  She would like to know if she can have a letter so that she can excise with him.

## 2015-11-07 ENCOUNTER — Encounter: Payer: Self-pay | Admitting: Internal Medicine

## 2015-11-07 NOTE — Telephone Encounter (Signed)
Letter typed and marked to print.

## 2015-11-10 NOTE — Telephone Encounter (Signed)
Letter signed and placed in your box.

## 2015-11-10 NOTE — Telephone Encounter (Signed)
Letter mailed & husbands request

## 2015-11-10 NOTE — Telephone Encounter (Signed)
Letter printed & placed in quick sign folder

## 2015-11-23 ENCOUNTER — Ambulatory Visit (INDEPENDENT_AMBULATORY_CARE_PROVIDER_SITE_OTHER): Payer: Medicare Other

## 2015-11-23 ENCOUNTER — Ambulatory Visit (INDEPENDENT_AMBULATORY_CARE_PROVIDER_SITE_OTHER): Payer: Medicare Other | Admitting: Podiatry

## 2015-11-23 ENCOUNTER — Encounter: Payer: Self-pay | Admitting: Podiatry

## 2015-11-23 VITALS — BP 152/73 | HR 72 | Resp 16

## 2015-11-23 DIAGNOSIS — M722 Plantar fascial fibromatosis: Secondary | ICD-10-CM

## 2015-11-23 DIAGNOSIS — Q828 Other specified congenital malformations of skin: Secondary | ICD-10-CM | POA: Diagnosis not present

## 2015-11-23 NOTE — Progress Notes (Signed)
   Subjective:    Patient ID: Cassidy Martinez, female    DOB: Nov 09, 1931, 82 y.o.   MRN: [id number]  HPI: She presents today with consistent heel pain to the right heel. She states it has been present for several months she has had previous surgery to the right foot by a the orthopedic department. She states that she is also having pain to her fifth digit right foot. She denies any trauma to the foot.    Review of Systems  Musculoskeletal: Positive for gait problem.  Skin: Positive for rash.  All other systems reviewed and are negative.      Objective:   Physical Exam: Vital signs are stable alert and oriented 3 no erythema edema cellulitis drainage and odor. Pulses are palpable. Neurologic sensorium is intact. Tendon reflexes are intact. Muscle strength is normal. Orthopedic evaluation demonstrates pain on palpation medial calcaneal tubercle of the right calcaneus. Radiographs do demonstrate soft tissue increase in density of the plantar fascia calcaneal insertion site consistent with plantar fasciitis. Cutaneous evaluation demonstrates a well hydrated cutis no open lesions or wounds.            Assessment & Plan:  Plantar fasciitis.  Plan: Injected the right heel today placed her into a plantar fascia brace follow-up with her 1 month.

## 2015-12-02 DIAGNOSIS — L309 Dermatitis, unspecified: Secondary | ICD-10-CM | POA: Diagnosis not present

## 2015-12-02 DIAGNOSIS — S90562A Insect bite (nonvenomous), left ankle, initial encounter: Secondary | ICD-10-CM | POA: Diagnosis not present

## 2015-12-08 DIAGNOSIS — D692 Other nonthrombocytopenic purpura: Secondary | ICD-10-CM | POA: Diagnosis not present

## 2015-12-08 DIAGNOSIS — S90562D Insect bite (nonvenomous), left ankle, subsequent encounter: Secondary | ICD-10-CM | POA: Diagnosis not present

## 2015-12-14 ENCOUNTER — Other Ambulatory Visit: Payer: Self-pay | Admitting: Surgical

## 2015-12-14 MED ORDER — ROSUVASTATIN CALCIUM 5 MG PO TABS: 5.0000 mg | ORAL_TABLET | Freq: Every day | ORAL | Status: DC

## 2015-12-23 DIAGNOSIS — L820 Inflamed seborrheic keratosis: Secondary | ICD-10-CM | POA: Diagnosis not present

## 2015-12-23 DIAGNOSIS — L84 Corns and callosities: Secondary | ICD-10-CM | POA: Diagnosis not present

## 2015-12-23 DIAGNOSIS — L309 Dermatitis, unspecified: Secondary | ICD-10-CM | POA: Diagnosis not present

## 2016-01-04 ENCOUNTER — Encounter: Payer: Self-pay | Admitting: Podiatry

## 2016-01-04 ENCOUNTER — Ambulatory Visit (INDEPENDENT_AMBULATORY_CARE_PROVIDER_SITE_OTHER): Payer: Medicare Other | Admitting: Podiatry

## 2016-01-04 DIAGNOSIS — M779 Enthesopathy, unspecified: Secondary | ICD-10-CM

## 2016-01-04 DIAGNOSIS — M722 Plantar fascial fibromatosis: Secondary | ICD-10-CM | POA: Diagnosis not present

## 2016-01-04 DIAGNOSIS — M778 Other enthesopathies, not elsewhere classified: Secondary | ICD-10-CM

## 2016-01-04 DIAGNOSIS — M7752 Other enthesopathy of left foot: Secondary | ICD-10-CM | POA: Diagnosis not present

## 2016-01-04 NOTE — Progress Notes (Signed)
She presents today for follow-up of plantar fasciitis to her left foot. She states that the heel is doing much better relating approximately an 80% improvement. She does however have pain on range of motion unknown palpation of the first metatarsophalangeal joint of the left foot.  Objective: Vital signs are stable alert and oriented 3. Pulses are palpable. Neurologic sensorium is intact. Deep tendon reflexes are intact. Anal range of motion and on palpation of the first metatarsophalangeal joint continues to have some tenderness on palpation of the medial calcaneal tubercle of the left heel.  Assessment: Pain in limb secondary to plantar fasciitis left and capsulitis with osteoarthritis first metatarsophalangeal joint left foot.  Plan: I injected the first metatarsophalangeal joint left foot today with Kenalog and local anesthetic. I also injected the left heel today with Kenalog and local anesthetic. We'll follow-up with her in a few months.  Roselind Messier DPM

## 2016-01-06 ENCOUNTER — Other Ambulatory Visit (INDEPENDENT_AMBULATORY_CARE_PROVIDER_SITE_OTHER): Payer: Medicare Other

## 2016-01-06 DIAGNOSIS — E7800 Pure hypercholesterolemia, unspecified: Secondary | ICD-10-CM

## 2016-01-06 DIAGNOSIS — K7689 Other specified diseases of liver: Secondary | ICD-10-CM | POA: Diagnosis not present

## 2016-01-06 DIAGNOSIS — R945 Abnormal results of liver function studies: Secondary | ICD-10-CM

## 2016-01-06 LAB — BASIC METABOLIC PANEL
BUN: 17 mg/dL (ref 6–23)
CO2: 31 mEq/L (ref 19–32)
Calcium: 9.5 mg/dL (ref 8.4–10.5)
Chloride: 105 mEq/L (ref 96–112)
Creatinine, Ser: 0.99 mg/dL (ref 0.40–1.20)
GFR: 57.06 mL/min — AB (ref >60.00)
GLUCOSE: 75 mg/dL (ref 70–99)
POTASSIUM: 3.6 meq/L (ref 3.5–5.1)
SODIUM: 141 meq/L (ref 135–145)

## 2016-01-06 LAB — LIPID PANEL
CHOL/HDL RATIO: 2
CHOLESTEROL: 210 mg/dL — AB (ref 0–200)
HDL: 87.9 mg/dL (ref >39.00)
LDL CALC: 103 mg/dL — AB (ref 0–99)
NonHDL: 122.49
Triglycerides: 98 mg/dL (ref 0.0–149.0)
VLDL: 19.6 mg/dL (ref 0.0–40.0)

## 2016-01-06 LAB — HEPATIC FUNCTION PANEL
ALT: 20 U/L (ref 0–35)
AST: 43 U/L — ABNORMAL HIGH (ref 0–37)
Albumin: 3.8 g/dL (ref 3.5–5.2)
Alkaline Phosphatase: 51 U/L (ref 39–117)
BILIRUBIN TOTAL: 0.6 mg/dL (ref 0.2–1.2)
Bilirubin, Direct: 0.1 mg/dL (ref 0.0–0.3)
Total Protein: 6.4 g/dL (ref 6.0–8.3)

## 2016-01-07 ENCOUNTER — Encounter: Payer: Self-pay | Admitting: *Deleted

## 2016-01-07 ENCOUNTER — Telehealth: Payer: Self-pay | Admitting: Internal Medicine

## 2016-01-07 NOTE — Telephone Encounter (Signed)
Ok Your welcome! °

## 2016-01-07 NOTE — Telephone Encounter (Signed)
[facility] called with a question regarding medications folic acid (FOLVITE) 1 MG tablet, CALCIUM PO, Cyanocobalamin (RA VITAMIN B-12 TR) 1000 MCG TBCR, Red Yeast Rice Extract 600 MG CAPS and rosuvastatin (CRESTOR) 5 MG tablet.   Arbie Cookey wants to know if pt is on these medications? [phone]. Thank you!

## 2016-01-07 NOTE — Telephone Encounter (Signed)
Spoke with Cassidy Martinez at the pharmacy and reviewed her medications. thanks

## 2016-02-17 ENCOUNTER — Encounter: Payer: Self-pay | Admitting: Podiatry

## 2016-02-17 ENCOUNTER — Ambulatory Visit (INDEPENDENT_AMBULATORY_CARE_PROVIDER_SITE_OTHER): Payer: Medicare Other | Admitting: Podiatry

## 2016-02-17 DIAGNOSIS — M722 Plantar fascial fibromatosis: Secondary | ICD-10-CM | POA: Diagnosis not present

## 2016-02-17 NOTE — Progress Notes (Signed)
She presents today stating that after the last injection her left heel she was doing much much better and now her right foot has started to hurt.  Objective: Vital signs stable she is alert and 3 pulses remain palpable. She has no pain on palpation medially continue tubercle of the left heel but she does have similar symptoms to the right heel as she did one month ago.  Assessment: Plantar fasciitis right foot resolving left foot.  Plan: Injected the right heel.

## 2016-02-22 ENCOUNTER — Ambulatory Visit (INDEPENDENT_AMBULATORY_CARE_PROVIDER_SITE_OTHER): Payer: Medicare Other | Admitting: Internal Medicine

## 2016-02-22 ENCOUNTER — Encounter: Payer: Self-pay | Admitting: Internal Medicine

## 2016-02-22 DIAGNOSIS — M810 Age-related osteoporosis without current pathological fracture: Secondary | ICD-10-CM

## 2016-02-22 DIAGNOSIS — Z0000 Encounter for general adult medical examination without abnormal findings: Secondary | ICD-10-CM

## 2016-02-22 DIAGNOSIS — F439 Reaction to severe stress, unspecified: Secondary | ICD-10-CM

## 2016-02-22 DIAGNOSIS — E7800 Pure hypercholesterolemia, unspecified: Secondary | ICD-10-CM | POA: Diagnosis not present

## 2016-02-22 DIAGNOSIS — C50412 Malignant neoplasm of upper-outer quadrant of left female breast: Secondary | ICD-10-CM

## 2016-02-22 DIAGNOSIS — R945 Abnormal results of liver function studies: Secondary | ICD-10-CM

## 2016-02-22 DIAGNOSIS — K7689 Other specified diseases of liver: Secondary | ICD-10-CM

## 2016-02-22 DIAGNOSIS — Z658 Other specified problems related to psychosocial circumstances: Secondary | ICD-10-CM | POA: Diagnosis not present

## 2016-02-22 DIAGNOSIS — H353290 Exudative age-related macular degeneration, unspecified eye, stage unspecified: Secondary | ICD-10-CM

## 2016-02-22 NOTE — Progress Notes (Signed)
Pre visit review using our clinic review tool, if applicable. No additional management support is needed unless otherwise documented below in the visit note. 

## 2016-02-22 NOTE — Progress Notes (Signed)
Patient ID: Cassidy Martinez, female   DOB: 07/15/1931, 82 y.o.   MRN: [id number]   Subjective:    Patient ID: Cassidy Martinez, female    DOB: 03-Nov-1931, 82 y.o.   MRN: [id number]  HPI  Patient with past history of breast cancer and hypercholesterolemia.  She comes in today to follow up on these issues and for her physical exam.  She was evaluated by Dr Dalbert Batman 09/02/15.  On letrozole.  Had right breast mammogram 08/31/15 - ok.  Recommended yearly f/u.  Weight is stable.  No chest pain.  No sob.  No acid reflux.  No abdominal pain or cramping.  Bowels stable.  She was just evaluated by podiatry for plantar fasciitis.     Past Medical History:  Diagnosis Date  . Arthritis    hands  . Breast CA ([place])    left;; S/P mastectomy 08/12/11  . Carpal tunnel syndrome, bilateral   . Heart murmur   . History of UTI    "have had a few over the years; haven't had once since ~ 2007"  . Hyperlipidemia   . Migraines    "used to have them severe; stopped when I took self off estrogen"  . PONV (postoperative nausea and vomiting)    Past Surgical History:  Procedure Laterality Date  . ABDOMINAL HYSTERECTOMY  ~ 1983  . APPENDECTOMY    . BREAST LUMPECTOMY  03/2009   "abnormal cells"; right  . CARPAL TUNNEL RELEASE     right; "I've had it operated on twice"  . EYE SURGERY  2006   repair of tear in retina  . MASTECTOMY COMPLETE / SIMPLE W/ SENTINEL NODE BIOPSY  08/12/11   axillary SNB; left  . MASTECTOMY W/ SENTINEL NODE BIOPSY  08/12/2011   Procedure: MASTECTOMY WITH SENTINEL LYMPH NODE BIOPSY;  Surgeon: Adin Hector, MD;  Location: [facility];  Service: General;  Laterality: Left;  . NASAL SINUS SURGERY    . SCALP MASS REMOVAL  2007   "knot; benign"   Family History  Problem Relation Age of Onset  . Stroke Mother   . Cancer Mother     ovarian  . Arthritis Father   . Cancer Maternal Aunt     breast  . Cancer Paternal Aunt     breast  . Anesthesia problems Neg Hx    Social History   Social History  .  Marital status: Married    Spouse name: N/A  . Number of children: 0  . Years of education: N/A   Social History Main Topics  . Smoking status: Never Smoker  . Smokeless tobacco: Never Used  . Alcohol use No  . Drug use: No  . Sexual activity: Yes   Other Topics Concern  . None   Social History Narrative  . None    Outpatient Encounter Prescriptions as of 02/22/2016  Medication Sig  . CALCIUM PO Take 1,000 mg by mouth daily.  . Cholecalciferol (VITAMIN D-3 PO) Take 1 capsule by mouth 2 (two) times daily.   Marland Kitchen letrozole (FEMARA) 2.5 MG tablet TAKE 1 TABLET (2.5 MG TOTAL) BY MOUTH DAILY.  . Red Yeast Rice Extract 600 MG CAPS Take 600 mg by mouth.  . rosuvastatin (CRESTOR) 5 MG tablet Take 1 tablet (5 mg total) by mouth daily.  . [DISCONTINUED] Multiple Vitamins-Minerals (VISION VITAMINS PO) Take by mouth. Vision Essential  . alendronate (FOSAMAX) 70 MG tablet TAKE 1 TABLET BY MOUTH EVERY 7 DAYS WITHA FULL GLASS OF WATER  ON AN EMPTY STOMACH. (Patient not taking: Reported on 02/22/2016)  . Multiple Vitamins-Minerals (PRESERVISION AREDS 2 PO) Take by mouth.  . [DISCONTINUED] Cyanocobalamin (RA VITAMIN B-12 TR) 1000 MCG TBCR Take by mouth.  . [DISCONTINUED] Emollient (CERAVE) LOTN   . [DISCONTINUED] folic acid (FOLVITE) 1 MG tablet Take by mouth.  . [DISCONTINUED] meloxicam (MOBIC) 15 MG tablet   . [DISCONTINUED] predniSONE (DELTASONE) 5 MG tablet   . [DISCONTINUED] triamcinolone cream (KENALOG) 0.1 % APPLY TOPICALLY 2 (TWO) TIMES DAILY.  . [DISCONTINUED] VOLTAREN 1 % GEL    No facility-administered encounter medications on file as of 02/22/2016.     Review of Systems  Constitutional: Negative for appetite change and unexpected weight change.  HENT: Negative for congestion and sinus pressure.   Eyes: Negative for pain and visual disturbance.  Respiratory: Negative for cough, chest tightness and shortness of breath.   Cardiovascular: Negative for chest pain, palpitations and leg  swelling.  Gastrointestinal: Negative for abdominal pain, diarrhea, nausea and vomiting.  Genitourinary: Negative for difficulty urinating and dysuria.  Musculoskeletal: Negative for back pain and joint swelling.       Foot pain as outlined.    Skin: Negative for color change and rash.  Neurological: Negative for dizziness, light-headedness and headaches.  Hematological: Negative for adenopathy. Does not bruise/bleed easily.  Psychiatric/Behavioral: Negative for agitation and dysphoric mood.       Objective:     Blood pressure rechecked by me:  138/78  Physical Exam  Constitutional: She appears well-developed and well-nourished. No distress.  HENT:  Nose: Nose normal.  Mouth/Throat: Oropharynx is clear and moist.  Eyes: Conjunctivae are normal. Right eye exhibits no discharge. Left eye exhibits no discharge.  Neck: Neck supple. No thyromegaly present.  Cardiovascular: Normal rate and regular rhythm.   Pulmonary/Chest: Breath sounds normal. No respiratory distress. She has no wheezes.  S/p left breast mastectomy.  Right breast - no nipple discharge or nipple retraction present.  Could not appreciate any distinct nodules or axillary adenopathy.    Abdominal: Soft. Bowel sounds are normal. There is no tenderness.  Genitourinary:  Genitourinary Comments: Not performed.    Musculoskeletal: She exhibits no edema or tenderness.  Lymphadenopathy:    She has no cervical adenopathy.  Skin: No rash noted. No erythema.  Psychiatric: She has a normal mood and affect. Her behavior is normal.    BP 138/78   Pulse (!) 55   Temp 97.6 F (36.4 C) (Oral)   Ht 5' 3.5" (1.613 m)   Wt 98 lb 6.4 oz (44.6 kg)   SpO2 97%   BMI 17.16 kg/m  Wt Readings from Last 3 Encounters:  02/22/16 98 lb 6.4 oz (44.6 kg)  08/24/15 97 lb 6.4 oz (44.2 kg)  08/20/15 96 lb 8 oz (43.8 kg)     Lab Results  Component Value Date   WBC 7.6 09/03/2015   HGB 14.5 09/03/2015   HCT 43.4 09/03/2015   PLT 212.0  09/03/2015   GLUCOSE 75 01/06/2016   CHOL 210 (H) 01/06/2016   TRIG 98.0 01/06/2016   HDL 87.90 01/06/2016   LDLDIRECT 174.2 07/09/2013   LDLCALC 103 (H) 01/06/2016   ALT 20 01/06/2016   AST 43 (H) 01/06/2016   NA 141 01/06/2016   K 3.6 01/06/2016   CL 105 01/06/2016   CREATININE 0.99 01/06/2016   BUN 17 01/06/2016   CO2 31 01/06/2016   TSH 0.94 09/03/2015    Mm Screening Breast Tomo Uni R  Result Date:  08/31/2015 CLINICAL DATA:  Screening. EXAM: 2D DIGITAL SCREENING UNILATERAL RIGHT MAMMOGRAM WITH CAD AND ADJUNCT TOMO COMPARISON:  Previous exam(s). ACR Breast Density Category c: The breast tissue is heterogeneously dense, which may obscure small masses. FINDINGS: The patient has had a left mastectomy. There are no findings suspicious for malignancy. Images were processed with CAD. IMPRESSION: No mammographic evidence of malignancy. A result letter of this screening mammogram will be mailed directly to the patient. RECOMMENDATION: Screening mammogram in one year.  (Code:SM-R-87M) BI-RADS CATEGORY  1: Negative. Electronically Signed   By: Ammie Ferrier M.D.   On: 08/31/2015 10:49       Assessment & Plan:   Problem List Items Addressed This Visit    Abnormal liver function    Recent liver panel stable.  Follow.        AMD (age-related macular degeneration), wet ([place])    Followed by opthalmology.  On eye vitamins.       Health care maintenance    Physical today 02/22/16.  Last colonoscopy 2002.  She declined f/u colonoscopy.  Mammogram 08/31/15 - [facility] I      Hypercholesterolemia    On low dose crestor.  Last lipid panel much improved.  Follow lipid panel and liver function tests.        Relevant Orders   Lipid panel   Hepatic function panel   Basic metabolic panel   Osteoporosis    Was on fosamax.  Appears to not be taking now.  Was being followed by oncology.  Have her confirm if taking.  Follow.        Primary cancer of upper outer quadrant of left female breast  [facility])    Followed by oncology and Dr Dalbert Batman.  Follow.  Mammogram 08/31/15 - [facility] I.  (right)      Stress    Increased stress with her husband's health issues.  Discussed with her today.  Does not feel need any further intervention.  Follow.         Other Visit Diagnoses   None.      Einar Pheasant, MD

## 2016-02-28 ENCOUNTER — Encounter: Payer: Self-pay | Admitting: Internal Medicine

## 2016-02-28 NOTE — Assessment & Plan Note (Signed)
Physical today 02/22/16.  Last colonoscopy 2002.  She declined f/u colonoscopy.  Mammogram 08/31/15 - [facility] I

## 2016-02-28 NOTE — Assessment & Plan Note (Signed)
Followed by oncology and Dr Dalbert Batman.  Follow.  Mammogram 08/31/15 - [facility] I.  (right)

## 2016-02-28 NOTE — Assessment & Plan Note (Signed)
Increased stress with her husband's health issues.  Discussed with her today.  Does not feel need any further intervention.  Follow.

## 2016-02-28 NOTE — Assessment & Plan Note (Addendum)
Recent liver panel stable.  Follow.

## 2016-02-28 NOTE — Assessment & Plan Note (Signed)
On low dose crestor.  Last lipid panel much improved.  Follow lipid panel and liver function tests.

## 2016-02-28 NOTE — Assessment & Plan Note (Signed)
Followed by opthalmology.  On eye vitamins.

## 2016-02-28 NOTE — Assessment & Plan Note (Signed)
Was on fosamax.  Appears to not be taking now.  Was being followed by oncology.  Have her confirm if taking.  Follow.

## 2016-03-23 ENCOUNTER — Ambulatory Visit (INDEPENDENT_AMBULATORY_CARE_PROVIDER_SITE_OTHER): Payer: Medicare Other | Admitting: Podiatry

## 2016-03-23 ENCOUNTER — Encounter: Payer: Self-pay | Admitting: Podiatry

## 2016-03-23 DIAGNOSIS — M722 Plantar fascial fibromatosis: Secondary | ICD-10-CM

## 2016-03-23 DIAGNOSIS — M2580 Other specified joint disorders, unspecified joint: Secondary | ICD-10-CM | POA: Diagnosis not present

## 2016-03-23 NOTE — Progress Notes (Signed)
She presents today for follow-up of pain to the right plantar aspect of the foot. States that her heel is doing much better but she still has pain right in here she points to the plantar aspect of the first metatarsophalangeal joint. She states that her hers with ambulation.  Objective: Ulcers remain palpable no pain on palpation of the heel however she does have tenderness on palpation at the plantar aspect just proximal to the sesamoids but no pain on palpation or range of motion.  Assessment: Sesamoiditis capsulitis just proximal to the sesamoidal apparatus right foot.  Plan: I injected the area today with Kenalog and local anesthetic. This should help alleviate her symptoms. All with her as needed.

## 2016-04-09 ENCOUNTER — Other Ambulatory Visit: Payer: Self-pay | Admitting: Hematology and Oncology

## 2016-04-09 ENCOUNTER — Other Ambulatory Visit: Payer: Self-pay | Admitting: Internal Medicine

## 2016-04-09 DIAGNOSIS — C50412 Malignant neoplasm of upper-outer quadrant of left female breast: Secondary | ICD-10-CM

## 2016-05-02 ENCOUNTER — Encounter: Payer: Self-pay | Admitting: Podiatry

## 2016-05-02 ENCOUNTER — Ambulatory Visit (INDEPENDENT_AMBULATORY_CARE_PROVIDER_SITE_OTHER): Payer: Medicare Other | Admitting: Podiatry

## 2016-05-02 DIAGNOSIS — M722 Plantar fascial fibromatosis: Secondary | ICD-10-CM

## 2016-05-02 NOTE — Progress Notes (Signed)
She presents today with a chief complaint of pain to the right heel.  Objective: Vital signs are stable she is alert and oriented 3. Pulses are palpable area she still has pain on palpation medial continue tubercle and the posterior Achilles area.  Assessment: Plantar fasciitis Achilles tendinitis right.  Plan: Injected the right heel today with Kenalog and local anesthetic. Follow-up with [place] as needed. Plan:

## 2016-05-04 ENCOUNTER — Other Ambulatory Visit (INDEPENDENT_AMBULATORY_CARE_PROVIDER_SITE_OTHER): Payer: Medicare Other

## 2016-05-04 ENCOUNTER — Other Ambulatory Visit: Payer: Medicare Other

## 2016-05-04 DIAGNOSIS — E7800 Pure hypercholesterolemia, unspecified: Secondary | ICD-10-CM

## 2016-05-04 LAB — BASIC METABOLIC PANEL
BUN: 19 mg/dL (ref 6–23)
CHLORIDE: 104 meq/L (ref 96–112)
CO2: 29 mEq/L (ref 19–32)
Calcium: 9.6 mg/dL (ref 8.4–10.5)
Creatinine, Ser: 0.93 mg/dL (ref 0.40–1.20)
GFR: 61.28 mL/min (ref >60.00)
Glucose, Bld: 83 mg/dL (ref 70–99)
POTASSIUM: 3.8 meq/L (ref 3.5–5.1)
SODIUM: 141 meq/L (ref 135–145)

## 2016-05-04 LAB — LIPID PANEL
CHOL/HDL RATIO: 2
CHOLESTEROL: 221 mg/dL — AB (ref 0–200)
HDL: 92.5 mg/dL (ref >39.00)
LDL CALC: 107 mg/dL — AB (ref 0–99)
NonHDL: 128.44
TRIGLYCERIDES: 105 mg/dL (ref 0.0–149.0)
VLDL: 21 mg/dL (ref 0.0–40.0)

## 2016-05-04 LAB — HEPATIC FUNCTION PANEL
ALBUMIN: 4.1 g/dL (ref 3.5–5.2)
ALT: 33 U/L (ref 0–35)
AST: 53 U/L — ABNORMAL HIGH (ref 0–37)
Alkaline Phosphatase: 63 U/L (ref 39–117)
Bilirubin, Direct: 0.1 mg/dL (ref 0.0–0.3)
TOTAL PROTEIN: 6.6 g/dL (ref 6.0–8.3)
Total Bilirubin: 0.8 mg/dL (ref 0.2–1.2)

## 2016-05-09 ENCOUNTER — Ambulatory Visit: Payer: Medicare Other | Admitting: Podiatry

## 2016-05-11 DIAGNOSIS — M159 Polyosteoarthritis, unspecified: Secondary | ICD-10-CM | POA: Diagnosis not present

## 2016-05-11 DIAGNOSIS — M79671 Pain in right foot: Secondary | ICD-10-CM | POA: Diagnosis not present

## 2016-05-11 DIAGNOSIS — M19071 Primary osteoarthritis, right ankle and foot: Secondary | ICD-10-CM | POA: Diagnosis not present

## 2016-05-25 DIAGNOSIS — M722 Plantar fascial fibromatosis: Secondary | ICD-10-CM | POA: Diagnosis not present

## 2016-05-25 DIAGNOSIS — M7731 Calcaneal spur, right foot: Secondary | ICD-10-CM | POA: Diagnosis not present

## 2016-06-01 ENCOUNTER — Encounter: Payer: Self-pay | Admitting: Internal Medicine

## 2016-06-01 ENCOUNTER — Ambulatory Visit (INDEPENDENT_AMBULATORY_CARE_PROVIDER_SITE_OTHER): Payer: Medicare Other | Admitting: Internal Medicine

## 2016-06-01 VITALS — BP 132/58 | HR 63 | Temp 97.8°F | Ht 64.0 in | Wt 96.8 lb

## 2016-06-01 DIAGNOSIS — R945 Abnormal results of liver function studies: Secondary | ICD-10-CM

## 2016-06-01 DIAGNOSIS — F439 Reaction to severe stress, unspecified: Secondary | ICD-10-CM

## 2016-06-01 DIAGNOSIS — E7800 Pure hypercholesterolemia, unspecified: Secondary | ICD-10-CM

## 2016-06-01 DIAGNOSIS — K7689 Other specified diseases of liver: Secondary | ICD-10-CM

## 2016-06-01 DIAGNOSIS — R221 Localized swelling, mass and lump, neck: Secondary | ICD-10-CM | POA: Diagnosis not present

## 2016-06-01 DIAGNOSIS — C50412 Malignant neoplasm of upper-outer quadrant of left female breast: Secondary | ICD-10-CM

## 2016-06-01 DIAGNOSIS — M810 Age-related osteoporosis without current pathological fracture: Secondary | ICD-10-CM

## 2016-06-01 NOTE — Progress Notes (Signed)
Patient ID: Cassidy Martinez, female   DOB: 23-Feb-1932, 82 y.o.   MRN: [id number]   Subjective:    Patient ID: Cassidy Martinez, female    DOB: 1931/07/30, 82 y.o.   MRN: [id number]  HPI  Patient here for a scheduled follow up.  She is seeing Dr Dalbert Batman - on letrozole.  Mammogram 08/31/15 - Bir[facility] I.  Has been having pain in her right foot.  Has seen podiatry. S/p injections.  Just saw Dr Cleda Mccreedy 05/25/16.  Had her foot wrapped.  She is still with increased pain.  Limits her activity at times.  States other than her foot pain, she is doing well.  Breathing stable.  Eating and drinking well.  No nausea or vomiting.  Bowels stable.     Past Medical History:  Diagnosis Date  . Arthritis    hands  . Breast CA ([place])    left;; S/P mastectomy 08/12/11  . Carpal tunnel syndrome, bilateral   . Heart murmur   . History of UTI    "have had a few over the years; haven't had once since ~ 2007"  . Hyperlipidemia   . Migraines    "used to have them severe; stopped when I took self off estrogen"  . PONV (postoperative nausea and vomiting)    Past Surgical History:  Procedure Laterality Date  . ABDOMINAL HYSTERECTOMY  ~ 1983  . APPENDECTOMY    . BREAST LUMPECTOMY  03/2009   "abnormal cells"; right  . CARPAL TUNNEL RELEASE     right; "I've had it operated on twice"  . EYE SURGERY  2006   repair of tear in retina  . MASTECTOMY COMPLETE / SIMPLE W/ SENTINEL NODE BIOPSY  08/12/11   axillary SNB; left  . MASTECTOMY W/ SENTINEL NODE BIOPSY  08/12/2011   Procedure: MASTECTOMY WITH SENTINEL LYMPH NODE BIOPSY;  Surgeon: Adin Hector, MD;  Location: [facility];  Service: General;  Laterality: Left;  . NASAL SINUS SURGERY    . SCALP MASS REMOVAL  2007   "knot; benign"   Family History  Problem Relation Age of Onset  . Stroke Mother   . Cancer Mother     ovarian  . Arthritis Father   . Cancer Maternal Aunt     breast  . Cancer Paternal Aunt     breast  . Anesthesia problems Neg Hx    Social History   Social  History  . Marital status: Married    Spouse name: N/A  . Number of children: 0  . Years of education: N/A   Social History Main Topics  . Smoking status: Never Smoker  . Smokeless tobacco: Never Used  . Alcohol use No  . Drug use: No  . Sexual activity: Yes   Other Topics Concern  . None   Social History Narrative  . None    Outpatient Encounter Prescriptions as of 06/01/2016  Medication Sig  . acetaminophen (TYLENOL) 500 MG tablet Take 500 mg by mouth every 6 (six) hours as needed.  Marland Kitchen CALCIUM PO Take 1,000 mg by mouth daily.  . Cholecalciferol (VITAMIN D-3 PO) Take 1 capsule by mouth 2 (two) times daily.   Marland Kitchen letrozole (FEMARA) 2.5 MG tablet TAKE ONE TABLET BY MOUTH EVERY DAY  . meloxicam (MOBIC) 15 MG tablet Take by mouth.  . Multiple Vitamins-Minerals (PRESERVISION AREDS 2 PO) Take by mouth.  . Red Yeast Rice Extract 600 MG CAPS Take 600 mg by mouth.  . rosuvastatin (CRESTOR) 5 MG  tablet Take 1 tablet (5 mg total) by mouth daily.  Marland Kitchen alendronate (FOSAMAX) 70 MG tablet TAKE 1 TABLET EVERY 7 DAYS WITH A FULL GLASS OF WATER ON AN EMPTY STOMACH DO NOT LIE DOWN FOR AT LEAST 30 MIN (Patient not taking: Reported on 06/01/2016)   No facility-administered encounter medications on file as of 06/01/2016.     Review of Systems  Constitutional: Negative for appetite change and unexpected weight change.  HENT: Negative for congestion and sinus pressure.   Respiratory: Negative for cough, chest tightness and shortness of breath.   Cardiovascular: Negative for chest pain, palpitations and leg swelling.  Gastrointestinal: Negative for abdominal pain, diarrhea, nausea and vomiting.  Genitourinary: Negative for difficulty urinating and dysuria.  Musculoskeletal: Negative for back pain and joint swelling.       Right foot pain as outlined.    Skin: Negative for color change and rash.  Neurological: Negative for dizziness, light-headedness and headaches.  Psychiatric/Behavioral: Negative  for agitation and dysphoric mood.       Objective:    Physical Exam  Constitutional: She appears well-developed and well-nourished. No distress.  HENT:  Nose: Nose normal.  Mouth/Throat: Oropharynx is clear and moist.  Neck: Neck supple. No thyromegaly present.  Cardiovascular: Normal rate and regular rhythm.   Pulmonary/Chest: Breath sounds normal. No respiratory distress. She has no wheezes.  Abdominal: Soft. Bowel sounds are normal. There is no tenderness.  Musculoskeletal: She exhibits no edema or tenderness.  Lymphadenopathy:    She has no cervical adenopathy.  Skin: No rash noted. No erythema.  Psychiatric: She has a normal mood and affect. Her behavior is normal.    BP (!) 132/58   Pulse 63   Temp 97.8 F (36.6 C) (Oral)   Ht 5\' 4"  (1.626 m)   Wt 96 lb 12.8 oz (43.9 kg)   SpO2 94%   BMI 16.62 kg/m  Wt Readings from Last 3 Encounters:  06/01/16 96 lb 12.8 oz (43.9 kg)  02/22/16 98 lb 6.4 oz (44.6 kg)  08/24/15 97 lb 6.4 oz (44.2 kg)     Lab Results  Component Value Date   WBC 7.6 09/03/2015   HGB 14.5 09/03/2015   HCT 43.4 09/03/2015   PLT 212.0 09/03/2015   GLUCOSE 83 05/04/2016   CHOL 221 (H) 05/04/2016   TRIG 105.0 05/04/2016   HDL 92.50 05/04/2016   LDLDIRECT 174.2 07/09/2013   LDLCALC 107 (H) 05/04/2016   ALT 33 05/04/2016   AST 53 (H) 05/04/2016   NA 141 05/04/2016   K 3.8 05/04/2016   CL 104 05/04/2016   CREATININE 0.93 05/04/2016   BUN 19 05/04/2016   CO2 29 05/04/2016   TSH 0.94 09/03/2015    Mm Screening Breast Tomo Uni R  Result Date: 08/31/2015 CLINICAL DATA:  Screening. EXAM: 2D DIGITAL SCREENING UNILATERAL RIGHT MAMMOGRAM WITH CAD AND ADJUNCT TOMO COMPARISON:  Previous exam(s). ACR Breast Density Category c: The breast tissue is heterogeneously dense, which may obscure small masses. FINDINGS: The patient has had a left mastectomy. There are no findings suspicious for malignancy. Images were processed with CAD. IMPRESSION: No  mammographic evidence of malignancy. A result letter of this screening mammogram will be mailed directly to the patient. RECOMMENDATION: Screening mammogram in one year.  (Code:SM-R-49M) BI-RADS CATEGORY  1: Negative. Electronically Signed   By: Ammie Ferrier M.D.   On: 08/31/2015 10:49       Assessment & Plan:   Problem List Items Addressed This Visit  Abnormal liver function    Recent liver panel stable.  Follow liver function tests.        Relevant Orders   Hepatic function panel   Hypercholesterolemia    On crestor.  Follow lipid panel and liver function tests.   Lab Results  Component Value Date   CHOL 221 (H) 05/04/2016   HDL 92.50 05/04/2016   LDLCALC 107 (H) 05/04/2016   LDLDIRECT 174.2 07/09/2013   TRIG 105.0 05/04/2016   CHOLHDL 2 05/04/2016        Relevant Orders   Lipid panel   TSH   Basic metabolic panel   Neck nodule    Neck nodule noted on exam.  Have ENT evaluate.        Relevant Orders   Ambulatory referral to ENT   CBC with Differential/Platelet   Osteoporosis - Primary   Relevant Orders   VITAMIN D 25 Hydroxy (Vit-D Deficiency, Fractures)   Primary cancer of upper outer quadrant of left female breast [facility])    Followed by oncology and Dr Dalbert Batman.  Mammogram 08/31/15 - [facility] I.  Follow.        Stress    Increased stress with her husband's health issues.  Does not feel she needs anything more at this time.  Follow.            Einar Pheasant, MD

## 2016-06-01 NOTE — Progress Notes (Signed)
Pre visit review using our clinic review tool, if applicable. No additional management support is needed unless otherwise documented below in the visit note. 

## 2016-06-07 ENCOUNTER — Encounter: Payer: Self-pay | Admitting: Internal Medicine

## 2016-06-07 NOTE — Assessment & Plan Note (Signed)
Followed by oncology and Dr Dalbert Batman.  Mammogram 08/31/15 - [facility] I.  Follow.

## 2016-06-07 NOTE — Assessment & Plan Note (Signed)
Neck nodule noted on exam.  Have ENT evaluate.

## 2016-06-07 NOTE — Assessment & Plan Note (Signed)
On crestor.  Follow lipid panel and liver function tests.   Lab Results  Component Value Date   CHOL 221 (H) 05/04/2016   HDL 92.50 05/04/2016   LDLCALC 107 (H) 05/04/2016   LDLDIRECT 174.2 07/09/2013   TRIG 105.0 05/04/2016   CHOLHDL 2 05/04/2016

## 2016-06-07 NOTE — Assessment & Plan Note (Signed)
Recent liver panel stable.  Follow liver function tests.

## 2016-06-07 NOTE — Assessment & Plan Note (Signed)
Increased stress with her husband's health issues.  Does not feel she needs anything more at this time.  Follow.

## 2016-06-20 DIAGNOSIS — R221 Localized swelling, mass and lump, neck: Secondary | ICD-10-CM | POA: Diagnosis not present

## 2016-06-22 DIAGNOSIS — M722 Plantar fascial fibromatosis: Secondary | ICD-10-CM | POA: Diagnosis not present

## 2016-07-07 ENCOUNTER — Other Ambulatory Visit: Payer: Self-pay | Admitting: Internal Medicine

## 2016-07-13 DIAGNOSIS — M722 Plantar fascial fibromatosis: Secondary | ICD-10-CM | POA: Diagnosis not present

## 2016-07-26 ENCOUNTER — Other Ambulatory Visit: Payer: Self-pay | Admitting: Internal Medicine

## 2016-07-26 DIAGNOSIS — Z1231 Encounter for screening mammogram for malignant neoplasm of breast: Secondary | ICD-10-CM

## 2016-08-11 ENCOUNTER — Ambulatory Visit (HOSPITAL_BASED_OUTPATIENT_CLINIC_OR_DEPARTMENT_OTHER): Payer: Medicare Other | Admitting: Hematology and Oncology

## 2016-08-11 ENCOUNTER — Encounter: Payer: Self-pay | Admitting: Hematology and Oncology

## 2016-08-11 DIAGNOSIS — Z853 Personal history of malignant neoplasm of breast: Secondary | ICD-10-CM | POA: Diagnosis not present

## 2016-08-11 DIAGNOSIS — C50412 Malignant neoplasm of upper-outer quadrant of left female breast: Secondary | ICD-10-CM

## 2016-08-11 DIAGNOSIS — M810 Age-related osteoporosis without current pathological fracture: Secondary | ICD-10-CM | POA: Diagnosis not present

## 2016-08-11 NOTE — Progress Notes (Signed)
Patient Care Team: Einar Pheasant, MD as PCP - General (Unknown Physician Specialty) Marcy Panning, MD (Hematology and Oncology)  DIAGNOSIS:  Encounter Diagnosis  Name Primary?  . Primary cancer of upper outer quadrant of left female breast ([place])     SUMMARY OF ONCOLOGIC HISTORY:   Primary cancer of upper outer quadrant of left female breast ([place])   08/12/2011 Surgery    Left mastectomy 2.2 cm tumor ER 100% positive PR negative HER-2 negative Ki-67 13% . (Prior History of partial right mastectomy for atypical ductal hyperplasia October 2010)      09/01/2011 - 08/11/2016 Anti-estrogen oral therapy     letrozole 2.5 mg daily       CHIEF COMPLIANT: Follow-up after completion of letrozole therapy  INTERVAL HISTORY: Cassidy Martinez is a 83 year old with above-mentioned history left breast cancer treated with mastectomy and is currently on oral letrozole. She finished 5 years of letrozole therapy. She can come off this medication currently. She has had osteoporosis and is currently on Fosamax and calcium and vitamin D. She appears to be tolerating that fairly well.  REVIEW OF SYSTEMS:   Constitutional: Denies fevers, chills or abnormal weight loss Eyes: Denies blurriness of vision Ears, nose, mouth, throat, and face: Denies mucositis or sore throat Respiratory: Denies cough, dyspnea or wheezes Cardiovascular: Denies palpitation, chest discomfort Gastrointestinal:  Denies nausea, heartburn or change in bowel habits Skin: Denies abnormal skin rashes Lymphatics: Denies new lymphadenopathy or easy bruising Neurological:Denies numbness, tingling or new weaknesses Behavioral/Psych: Mood is stable, no new changes  Extremities: No lower extremity edema Breast:  denies any pain or lumps or nodules in either breasts All other systems were reviewed with the patient and are negative.  I have reviewed the past medical history, past surgical history, social history and family history with the  patient and they are unchanged from previous note.  ALLERGIES:  is allergic to sulfa antibiotics; macrobid [nitrofurantoin macrocrystal]; other; and penicillins.  MEDICATIONS:  Current Outpatient Prescriptions  Medication Sig Dispense Refill  . acetaminophen (TYLENOL) 500 MG tablet Take 500 mg by mouth every 6 (six) hours as needed.    Marland Kitchen alendronate (FOSAMAX) 70 MG tablet TAKE 1 TABLET EVERY 7 DAYS WITH A FULL GLASS OF WATER ON AN EMPTY STOMACH DO NOT LIE DOWN FOR AT LEAST 30 MIN 4 tablet 1  . CALCIUM PO Take 1,000 mg by mouth daily.    . Cholecalciferol (VITAMIN D-3 PO) Take 1 capsule by mouth 2 (two) times daily.     . Multiple Vitamins-Minerals (PRESERVISION AREDS 2 PO) Take by mouth.    . Red Yeast Rice Extract 600 MG CAPS Take 600 mg by mouth.    . rosuvastatin (CRESTOR) 5 MG tablet Take 1 tablet (5 mg total) by mouth daily. 90 tablet 1   No current facility-administered medications for this visit.     PHYSICAL EXAMINATION: ECOG PERFORMANCE STATUS: 0 - Asymptomatic  Vitals:   08/11/16 0851  BP: 91/79  Pulse: 60  Resp: 18  Temp: 98.4 F (36.9 C)   Filed Weights   08/11/16 0851  Weight: 97 lb 8 oz (44.2 kg)    GENERAL:alert, no distress and comfortable SKIN: skin color, texture, turgor are normal, no rashes or significant lesions EYES: normal, Conjunctiva are pink and non-injected, sclera clear OROPHARYNX:no exudate, no erythema and lips, buccal mucosa, and tongue normal  NECK: supple, thyroid normal size, non-tender, without nodularity LYMPH:  no palpable lymphadenopathy in the cervical, axillary or inguinal LUNGS: clear to  auscultation and percussion with normal breathing effort HEART: regular rate & rhythm and no murmurs and no lower extremity edema ABDOMEN:abdomen soft, non-tender and normal bowel sounds MUSCULOSKELETAL:no cyanosis of digits and no clubbing  NEURO: alert & oriented x 3 with fluent speech, no focal motor/sensory deficits EXTREMITIES: No lower  extremity edema BREAST: Left mastectomy. No palpable lumps or nodules in the chest wall or axilla. Right breast exam is benign. (exam performed in the presence of a chaperone)  LABORATORY DATA:  I have reviewed the data as listed   Chemistry      Component Value Date/Time   NA 141 05/04/2016 0821   NA 141 08/06/2014 1319   K 3.8 05/04/2016 0821   K 4.4 08/06/2014 1319   CL 104 05/04/2016 0821   CL 103 07/05/2012 0913   CO2 29 05/04/2016 0821   CO2 26 08/06/2014 1319   BUN 19 05/04/2016 0821   BUN 24.9 08/06/2014 1319   CREATININE 0.93 05/04/2016 0821   CREATININE 1.1 08/06/2014 1319      Component Value Date/Time   CALCIUM 9.6 05/04/2016 0821   CALCIUM 9.6 08/06/2014 1319   ALKPHOS 63 05/04/2016 0821   ALKPHOS 103 08/06/2014 1319   AST 53 (H) 05/04/2016 0821   AST 40 (H) 08/06/2014 1319   ALT 33 05/04/2016 0821   ALT 17 08/06/2014 1319   BILITOT 0.8 05/04/2016 0821   BILITOT 0.41 08/06/2014 1319       Lab Results  Component Value Date   WBC 7.6 09/03/2015   HGB 14.5 09/03/2015   HCT 43.4 09/03/2015   MCV 89.0 09/03/2015   PLT 212.0 09/03/2015   NEUTROABS 5.1 09/03/2015    ASSESSMENT & PLAN:  Primary cancer of upper outer quadrant of left female breast Stage II a T2, N0, M0 ER positive PR negative HER-2 negative Ki-67 13% status post mastectomy on the left breast 08/12/2011 currently on letrozole 2.5 mg daily completed 08/11/2016 I recommended that the patient can stop taking antiestrogen therapy at this time.  Letrozole toxicities: No major side effects to treatment, being monitored for osteoporosis currently takes calcium and vitamin D, Patient had a T score of -2.3 in 2013. repeat bone density 07/29/2014 T score -2.9 suggestive of osteoporosis. Osteoporosis: Currently on Fosamax and calcium and vitamin D Encouraged her to do weightbearing exercises  Breast cancer surveillance: 1. Breast exam 08/11/2016 is normal  2. Right breast mammogram 08/31/2015 is  normal, breast density categories  Return to clinic in 1 yr for follow-up with survivorship clinic   I spent 25 minutes talking to the patient of which more than half was spent in counseling and coordination of care.  No orders of the defined types were placed in this encounter.  The patient has a good understanding of the overall plan. she agrees with it. she will call with any problems that may develop before the next visit here.   Rulon Eisenmenger, MD 08/11/16

## 2016-08-11 NOTE — Assessment & Plan Note (Addendum)
Stage II a T2, N0, M0 ER positive PR negative HER-2 negative Ki-67 13% status post mastectomy on the left breast 08/12/2011 currently on letrozole 2.5 mg daily completed 08/11/2016 I recommended that the patient can stop taking antiestrogen therapy at this time.  Letrozole toxicities: No major side effects to treatment, being monitored for osteoporosis currently takes calcium and vitamin D, Patient had a T score of -2.3 in 2013. repeat bone density 07/29/2014 T score -2.9 suggestive of osteoporosis. Osteoporosis: Currently on Fosamax and calcium and vitamin D Encouraged her to do weightbearing exercises  Breast cancer surveillance: 1. Breast exam 08/11/2016 is normal  2. Right breast mammogram 08/31/2015 is normal, breast density categories  Return to clinic in 1 yr for follow-up with survivorship clinic

## 2016-08-23 ENCOUNTER — Ambulatory Visit: Payer: Medicare Other

## 2016-08-26 ENCOUNTER — Ambulatory Visit (INDEPENDENT_AMBULATORY_CARE_PROVIDER_SITE_OTHER): Payer: Medicare Other

## 2016-08-26 VITALS — BP 120/60 | HR 61 | Temp 98.6°F | Resp 12 | Ht 64.0 in | Wt 96.8 lb

## 2016-08-26 DIAGNOSIS — Z0000 Encounter for general adult medical examination without abnormal findings: Secondary | ICD-10-CM | POA: Diagnosis not present

## 2016-08-26 NOTE — Progress Notes (Signed)
Subjective:   Cassidy Martinez is a 84 y.o. female who presents for Medicare Annual (Subsequent) preventive examination.  Review of Systems:  No ROS.  Medicare Wellness Visit. Cardiac Risk Factors include: advanced age (>52men, >42 women)     Objective:     Vitals: BP 120/60 (BP Location: Left Arm, Patient Position: Sitting, Cuff Size: Normal)   Pulse 61   Temp 98.6 F (37 C) (Oral)   Resp 12   Ht 5\' 4"  (1.626 m)   Wt 96 lb 12.8 oz (43.9 kg)   SpO2 98%   BMI 16.62 kg/m   Body mass index is 16.62 kg/m.   Tobacco History  Smoking Status  . Never Smoker  Smokeless Tobacco  . Never Used     Counseling given: Not Answered   Past Medical History:  Diagnosis Date  . Arthritis    hands  . Breast CA ([place])    left;; S/P mastectomy 08/12/11  . Carpal tunnel syndrome, bilateral   . Heart murmur   . History of UTI    "have had a few over the years; haven't had once since ~ 2007"  . Hyperlipidemia   . Migraines    "used to have them severe; stopped when I took self off estrogen"  . PONV (postoperative nausea and vomiting)    Past Surgical History:  Procedure Laterality Date  . ABDOMINAL HYSTERECTOMY  ~ 1983  . APPENDECTOMY    . BREAST LUMPECTOMY  03/2009   "abnormal cells"; right  . CARPAL TUNNEL RELEASE     right; "I've had it operated on twice"  . EYE SURGERY  2006   repair of tear in retina  . MASTECTOMY COMPLETE / SIMPLE W/ SENTINEL NODE BIOPSY  08/12/11   axillary SNB; left  . MASTECTOMY W/ SENTINEL NODE BIOPSY  08/12/2011   Procedure: MASTECTOMY WITH SENTINEL LYMPH NODE BIOPSY;  Surgeon: Adin Hector, MD;  Location: [facility];  Service: General;  Laterality: Left;  . NASAL SINUS SURGERY    . SCALP MASS REMOVAL  2007   "knot; benign"   Family History  Problem Relation Age of Onset  . Stroke Mother   . Cancer Mother     ovarian  . Arthritis Father   . Cancer Maternal Aunt     breast  . Cancer Paternal Aunt     breast  . Dementia Brother   . Anesthesia  problems Neg Hx    History  Sexual Activity  . Sexual activity: Yes    Outpatient Encounter Prescriptions as of 08/26/2016  Medication Sig  . acetaminophen (TYLENOL) 500 MG tablet Take 500 mg by mouth every 6 (six) hours as needed.  Marland Kitchen alendronate (FOSAMAX) 70 MG tablet TAKE 1 TABLET EVERY 7 DAYS WITH A FULL GLASS OF WATER ON AN EMPTY STOMACH DO NOT LIE DOWN FOR AT LEAST 30 MIN  . CALCIUM PO Take 1,000 mg by mouth daily.  . Cholecalciferol (VITAMIN D-3 PO) Take 1 capsule by mouth 2 (two) times daily.   . Multiple Vitamins-Minerals (PRESERVISION AREDS 2 PO) Take by mouth.  . Red Yeast Rice Extract 600 MG CAPS Take 600 mg by mouth.  . rosuvastatin (CRESTOR) 5 MG tablet Take 1 tablet (5 mg total) by mouth daily.   No facility-administered encounter medications on file as of 08/26/2016.     Activities of Daily Living In your present state of health, do you have any difficulty performing the following activities: 08/26/2016  Hearing? N  Vision? N  Difficulty concentrating or making decisions? N  Walking or climbing stairs? N  Dressing or bathing? N  Doing errands, shopping? N  Preparing Food and eating ? N  Using the Toilet? N  In the past six months, have you accidently leaked urine? N  Do you have problems with loss of bowel control? N  Managing your Medications? N  Managing your Finances? N  Housekeeping or managing your Housekeeping? N  Some recent data might be hidden    Patient Care Team: Einar Pheasant, MD as PCP - General (Unknown Physician Specialty) Marcy Panning, MD (Hematology and Oncology)    Assessment:    This is a routine wellness examination for Cassidy Martinez. The goal of the wellness visit is to assist the patient how to close the gaps in care and create a preventative care plan for the patient.   Taking calcium VIT D as appropriate/Osteoporosis reviewed.  Medications reviewed; taking without issues or barriers.  Safety issues reviewed; smoke detectors in the  home. No firearms in the home. Wears seatbelts when driving or riding with others. Patient does wear sunscreen or protective clothing when in direct sunlight. No violence in the home.  Patient is alert, normal appearance, oriented to person/place/and time. Correctly identified the president of the [place], recall of 2/3 words, and performing simple calculations.  Patient displays appropriate judgement and can read correct time from watch face.  No new identified risk were noted.  No failures at ADL's or IADL's.   BMI- discussed the importance of a healthy diet, water intake and exercise. Educational material provided.   Diet: Breakfast: Ham buscuit Lunch: Spaghetti Dinner: Meat, green vegetable Daily fluid intake: 3 cups of juice, 3 cups of water  Dental- every six months.    Eye- Visual acuity not assessed per patient preference since they have regular follow up with the ophthalmologist.  Wears corrective lenses.  Sleep patterns- Sleeps 8 hours at night.  Wakes feeling rested.  TDAP and Prevnar 13 vaccine deferred per patient preference.   Educational material provided.  Patient Concerns: None at this time. Follow up with PCP as needed.  Exercise Activities and Dietary recommendations Current Exercise Habits: The patient does not participate in regular exercise at present  Goals    . Healthy Lifestyle          Stay hydrated! Drink plenty of fluids. Healthy diet.    . Increase physical activity          Start attending the [employer] with son      Fall Risk Fall Risk  08/26/2016 02/22/2016 08/24/2015 07/15/2014 02/11/2014  Falls in the past year? No No No No No  Number falls in past yr: - - - - -  Injury with Fall? - - - - -   Depression Screen PHQ 2/9 Scores 08/26/2016 02/22/2016 08/24/2015 07/15/2014  PHQ - 2 Score 0 0 0 1     Cognitive Function MMSE - Mini Mental State Exam 08/26/2016 08/24/2015  Orientation to time 5 5  Orientation to Place 5 5  Registration 3 3  Attention/  Calculation 5 5  Recall 2 3  Recall-comments Recall 2 out of 3 words -  Language- name 2 objects 2 2  Language- repeat 1 1  Language- follow 3 step command 3 3  Language- read & follow direction 1 1  Write a sentence 1 1  Copy design 0 1  Copy design-comments Did not draw demonstration appropriate -  Total score 28 30  There is no immunization history on file for this patient. Screening Tests Health Maintenance  Topic Date Due  . PNA vac Low Risk Adult (1 of 2 - PCV13) 11/20/1998  . DEXA SCAN  07/29/2016  . TETANUS/TDAP  07/15/2024 (Originally 11/19/1950)  . MAMMOGRAM  08/30/2016      Plan:    End of life planning; Advance aging; Advanced directives discussed. Copy of current HCPOA/Living Will on file.    Medicare Attestation I have personally reviewed: The patient's medical and social history Their use of alcohol, tobacco or illicit drugs Their current medications and supplements The patient's functional ability including ADLs,fall risks, home safety risks, cognitive, and hearing and visual impairment Diet and physical activities Evidence for depression   The patient's weight, height, BMI, and visual acuity have been recorded in the chart.  I have made referrals and provided education to the patient based on review of the above and I have provided the patient with a written personalized care plan for preventive services.    During the course of the visit the patient was educated and counseled about the following appropriate screening and preventive services:   Vaccines to include Pneumoccal, Influenza, Hepatitis B, Td, Zostavax, HCV  Colorectal cancer screening-UTD  Bone density screening-due  Glaucoma screening-annual eye exam  Mammography-scheduled  Nutrition counseling   Patient Instructions (the written plan) was given to the patient.   Varney Biles, LPN  0/60/1561   Reviewed above information.  Agree with plan.  Dr Nicki Reaper

## 2016-08-26 NOTE — Patient Instructions (Addendum)
  Cassidy Martinez , Thank you for taking time to come for your Medicare Wellness Visit. I appreciate your ongoing commitment to your health goals. Please review the following plan we discussed and let me know if I can assist you in the future.   Follow up with Dr. Nicki Reaper as needed.    Bring a copy of your [place] and/or Living Will to be scanned into chart.  Have a great day!  These are the goals we discussed: Goals    . Healthy Lifestyle          Stay hydrated! Drink plenty of fluids. Healthy diet.    . Increase physical activity          Start attending the [employer] with son       This is a list of the screening recommended for you and due dates:  Health Maintenance  Topic Date Due  . Pneumonia vaccines (1 of 2 - PCV13) 11/20/1998  . DEXA scan (bone density measurement)  07/29/2016  . Tetanus Vaccine  07/15/2024*  . Mammogram  08/30/2016  *Topic was postponed. The date shown is not the original due date.

## 2016-09-01 ENCOUNTER — Ambulatory Visit
Admission: RE | Admit: 2016-09-01 | Discharge: 2016-09-01 | Disposition: A | Discharge status: Home / Self Care | Payer: Medicare Other | Source: Ambulatory Visit | Attending: Internal Medicine | Admitting: Internal Medicine

## 2016-09-01 DIAGNOSIS — Z1231 Encounter for screening mammogram for malignant neoplasm of breast: Secondary | ICD-10-CM

## 2016-10-03 ENCOUNTER — Other Ambulatory Visit (INDEPENDENT_AMBULATORY_CARE_PROVIDER_SITE_OTHER): Payer: Medicare Other

## 2016-10-03 DIAGNOSIS — M810 Age-related osteoporosis without current pathological fracture: Secondary | ICD-10-CM | POA: Diagnosis not present

## 2016-10-03 DIAGNOSIS — K7689 Other specified diseases of liver: Secondary | ICD-10-CM

## 2016-10-03 DIAGNOSIS — R945 Abnormal results of liver function studies: Secondary | ICD-10-CM

## 2016-10-03 DIAGNOSIS — E7800 Pure hypercholesterolemia, unspecified: Secondary | ICD-10-CM

## 2016-10-03 DIAGNOSIS — R221 Localized swelling, mass and lump, neck: Secondary | ICD-10-CM

## 2016-10-03 LAB — CBC WITH DIFFERENTIAL/PLATELET
BASOS PCT: 1 % (ref 0.0–3.0)
Basophils Absolute: 0.1 10*3/uL (ref 0.0–0.1)
EOS PCT: 4.2 % (ref 0.0–5.0)
Eosinophils Absolute: 0.3 10*3/uL (ref 0.0–0.7)
HEMATOCRIT: 43.1 % (ref 36.0–46.0)
HEMOGLOBIN: 14.2 g/dL (ref 12.0–15.0)
Lymphocytes Relative: 21.1 % (ref 12.0–46.0)
Lymphs Abs: 1.3 10*3/uL (ref 0.7–4.0)
MCHC: 32.9 g/dL (ref 30.0–36.0)
MCV: 90.8 fl (ref 78.0–100.0)
Monocytes Absolute: 0.5 10*3/uL (ref 0.1–1.0)
Monocytes Relative: 8.2 % (ref 3.0–12.0)
NEUTROS ABS: 4 10*3/uL (ref 1.4–7.7)
Neutrophils Relative %: 65.5 % (ref 43.0–77.0)
Platelets: 193 10*3/uL (ref 150.0–400.0)
RBC: 4.75 Mil/uL (ref 3.87–5.11)
RDW: 14 % (ref 11.5–15.5)
WBC: 6.1 10*3/uL (ref 4.0–10.5)

## 2016-10-03 LAB — BASIC METABOLIC PANEL
BUN: 20 mg/dL (ref 6–23)
CALCIUM: 9.7 mg/dL (ref 8.4–10.5)
CHLORIDE: 104 meq/L (ref 96–112)
CO2: 30 meq/L (ref 19–32)
Creatinine, Ser: 0.98 mg/dL (ref 0.40–1.20)
GFR: 57.62 mL/min — ABNORMAL LOW (ref >60.00)
Glucose, Bld: 92 mg/dL (ref 70–99)
POTASSIUM: 4.2 meq/L (ref 3.5–5.1)
SODIUM: 140 meq/L (ref 135–145)

## 2016-10-03 LAB — LIPID PANEL
Cholesterol: 315 mg/dL — ABNORMAL HIGH (ref 0–200)
HDL: 107.3 mg/dL (ref >39.00)
LDL Cholesterol: 194 mg/dL — ABNORMAL HIGH (ref 0–99)
NonHDL: 208.18
TRIGLYCERIDES: 71 mg/dL (ref 0.0–149.0)
Total CHOL/HDL Ratio: 3
VLDL: 14.2 mg/dL (ref 0.0–40.0)

## 2016-10-03 LAB — HEPATIC FUNCTION PANEL
ALBUMIN: 4.1 g/dL (ref 3.5–5.2)
ALT: 18 U/L (ref 0–35)
AST: 51 U/L — ABNORMAL HIGH (ref 0–37)
Alkaline Phosphatase: 61 U/L (ref 39–117)
Bilirubin, Direct: 0.1 mg/dL (ref 0.0–0.3)
TOTAL PROTEIN: 6.8 g/dL (ref 6.0–8.3)
Total Bilirubin: 0.8 mg/dL (ref 0.2–1.2)

## 2016-10-03 LAB — TSH: TSH: 1.91 u[IU]/mL (ref 0.35–4.50)

## 2016-10-03 LAB — VITAMIN D 25 HYDROXY (VIT D DEFICIENCY, FRACTURES): VITD: 32.74 ng/mL (ref 30.00–100.00)

## 2016-10-05 ENCOUNTER — Ambulatory Visit (INDEPENDENT_AMBULATORY_CARE_PROVIDER_SITE_OTHER): Payer: Medicare Other | Admitting: Internal Medicine

## 2016-10-05 ENCOUNTER — Encounter: Payer: Self-pay | Admitting: Internal Medicine

## 2016-10-05 VITALS — BP 120/60 | HR 78 | Temp 98.7°F | Resp 12 | Ht 64.0 in | Wt 97.6 lb

## 2016-10-05 DIAGNOSIS — R413 Other amnesia: Secondary | ICD-10-CM

## 2016-10-05 DIAGNOSIS — Z23 Encounter for immunization: Secondary | ICD-10-CM | POA: Diagnosis not present

## 2016-10-05 DIAGNOSIS — M810 Age-related osteoporosis without current pathological fracture: Secondary | ICD-10-CM

## 2016-10-05 DIAGNOSIS — K7689 Other specified diseases of liver: Secondary | ICD-10-CM

## 2016-10-05 DIAGNOSIS — R410 Disorientation, unspecified: Secondary | ICD-10-CM

## 2016-10-05 DIAGNOSIS — E7800 Pure hypercholesterolemia, unspecified: Secondary | ICD-10-CM | POA: Diagnosis not present

## 2016-10-05 DIAGNOSIS — R945 Abnormal results of liver function studies: Secondary | ICD-10-CM

## 2016-10-05 DIAGNOSIS — C50412 Malignant neoplasm of upper-outer quadrant of left female breast: Secondary | ICD-10-CM

## 2016-10-05 LAB — VITAMIN B12: VITAMIN B 12: 197 pg/mL — AB (ref 211–911)

## 2016-10-05 NOTE — Progress Notes (Signed)
Pre-visit discussion using our clinic review tool. No additional management support is needed unless otherwise documented below in the visit note.  

## 2016-10-05 NOTE — Progress Notes (Signed)
Patient ID: Cassidy Martinez, female   DOB: [birth year], 83 y.o.   MRN: [id number]   Subjective:    Patient ID: Cassidy Martinez, female    DOB: Aug 11, 1931, 83 y.o.   MRN: [id number]  HPI  Patient here for a scheduled follow up.  Has a history of breast cancer.  s/p left mastectomy.  She was on letrozole.  Saw Dr Lindi Adie 08/11/16.  States completd letrozole.  She has stopped all of her medications.  She was confused over what medication she is supposed to be taking.  Brings in bottles of the same medications.  Discussed with her at length today.  Discussed medications and went through her medications with her.  Some increased confusion.  States she is eating.  No chest pain.  No sob.  No acid reflux.  No abdominal pain.  Bowels moving.  Some memory change.     Past Medical History:  Diagnosis Date  . Arthritis    hands  . Breast CA ([place])    left;; S/P mastectomy 08/12/11  . Carpal tunnel syndrome, bilateral   . Heart murmur   . History of UTI    "have had a few over the years; haven't had once since ~ 2007"  . Hyperlipidemia   . Migraines    "used to have them severe; stopped when I took self off estrogen"  . PONV (postoperative nausea and vomiting)    Past Surgical History:  Procedure Laterality Date  . ABDOMINAL HYSTERECTOMY  ~ 1983  . APPENDECTOMY    . BREAST LUMPECTOMY  03/2009   "abnormal cells"; right  . CARPAL TUNNEL RELEASE     right; "I've had it operated on twice"  . EYE SURGERY  2006   repair of tear in retina  . MASTECTOMY COMPLETE / SIMPLE W/ SENTINEL NODE BIOPSY  08/12/11   axillary SNB; left  . MASTECTOMY W/ SENTINEL NODE BIOPSY  08/12/2011   Procedure: MASTECTOMY WITH SENTINEL LYMPH NODE BIOPSY;  Surgeon: Adin Hector, MD;  Location: [facility];  Service: General;  Laterality: Left;  . NASAL SINUS SURGERY    . SCALP MASS REMOVAL  2007   "knot; benign"   Family History  Problem Relation Age of Onset  . Stroke Mother   . Cancer Mother     ovarian  . Arthritis Father   .  Cancer Maternal Aunt     breast  . Cancer Paternal Aunt     breast  . Dementia Brother   . Anesthesia problems Neg Hx    Social History   Social History  . Marital status: Married    Spouse name: N/A  . Number of children: 0  . Years of education: N/A   Social History Main Topics  . Smoking status: Never Smoker  . Smokeless tobacco: Never Used  . Alcohol use No  . Drug use: No  . Sexual activity: Yes   Other Topics Concern  . None   Social History Narrative  . None    Outpatient Encounter Prescriptions as of 10/05/2016  Medication Sig  . [DISCONTINUED] acetaminophen (TYLENOL) 500 MG tablet Take 500 mg by mouth every 6 (six) hours as needed.  Marland Kitchen alendronate (FOSAMAX) 70 MG tablet TAKE 1 TABLET EVERY 7 DAYS WITH A FULL GLASS OF WATER ON AN EMPTY STOMACH DO NOT LIE DOWN FOR AT LEAST 30 MIN (Patient not taking: Reported on 10/05/2016)  . CALCIUM PO Take 1,000 mg by mouth daily.  . Cholecalciferol (VITAMIN D-3 PO)  Take 1 capsule by mouth 2 (two) times daily.   Marland Kitchen letrozole (FEMARA) 2.5 MG tablet Take 1 tablet by mouth daily.  . Multiple Vitamins-Minerals (PRESERVISION AREDS 2 PO) Take by mouth.  . rosuvastatin (CRESTOR) 5 MG tablet Take 1 tablet (5 mg total) by mouth daily. (Patient not taking: Reported on 10/05/2016)  . [DISCONTINUED] meloxicam (MOBIC) 15 MG tablet Take 1 tablet by mouth daily.  . [DISCONTINUED] Red Yeast Rice Extract 600 MG CAPS Take 600 mg by mouth.   No facility-administered encounter medications on file as of 10/05/2016.     Review of Systems  Constitutional: Negative for appetite change and unexpected weight change.  HENT: Negative for congestion and sinus pressure.   Respiratory: Negative for cough, chest tightness and shortness of breath.   Cardiovascular: Negative for chest pain, palpitations and leg swelling.  Gastrointestinal: Negative for abdominal pain, diarrhea, nausea and vomiting.  Genitourinary: Negative for difficulty urinating and dysuria.    Musculoskeletal: Negative for joint swelling and myalgias.  Skin: Negative for color change and rash.  Neurological: Negative for dizziness, light-headedness and headaches.  Psychiatric/Behavioral: Negative for agitation and dysphoric mood.       Objective:    Physical Exam  Constitutional: She appears well-developed and well-nourished. No distress.  HENT:  Nose: Nose normal.  Mouth/Throat: Oropharynx is clear and moist.  Neck: Neck supple. No thyromegaly present.  Cardiovascular: Normal rate and regular rhythm.   Pulmonary/Chest: Breath sounds normal. No respiratory distress. She has no wheezes.  Abdominal: Soft. Bowel sounds are normal. There is no tenderness.  Musculoskeletal: She exhibits no edema or tenderness.  Lymphadenopathy:    She has no cervical adenopathy.  Skin: No rash noted. No erythema.    BP 120/60 (BP Location: Left Arm, Patient Position: Sitting, Cuff Size: Normal)   Pulse 78   Temp 98.7 F (37.1 C) (Oral)   Resp 12   Ht 5\' 4"  (1.626 m)   Wt 97 lb 9.6 oz (44.3 kg)   SpO2 98%   BMI 16.75 kg/m  Wt Readings from Last 3 Encounters:  10/05/16 97 lb 9.6 oz (44.3 kg)  08/26/16 96 lb 12.8 oz (43.9 kg)  08/11/16 97 lb 8 oz (44.2 kg)     Lab Results  Component Value Date   WBC 6.1 10/03/2016   HGB 14.2 10/03/2016   HCT 43.1 10/03/2016   PLT 193.0 10/03/2016   GLUCOSE 92 10/03/2016   CHOL 315 (H) 10/03/2016   TRIG 71.0 10/03/2016   HDL 107.30 10/03/2016   LDLDIRECT 174.2 07/09/2013   LDLCALC 194 (H) 10/03/2016   ALT 18 10/03/2016   AST 51 (H) 10/03/2016   NA 140 10/03/2016   K 4.2 10/03/2016   CL 104 10/03/2016   CREATININE 0.98 10/03/2016   BUN 20 10/03/2016   CO2 30 10/03/2016   TSH 1.91 10/03/2016    Mm Screening Breast Tomo Uni R  Result Date: 09/01/2016 CLINICAL DATA:  Screening. EXAM: 2D DIGITAL SCREENING UNILATERAL RIGHT MAMMOGRAM WITH CAD AND ADJUNCT TOMO COMPARISON:  Previous exam(s). ACR Breast Density Category c: The breast tissue  is heterogeneously dense, which may obscure small masses. FINDINGS: The patient has had a left mastectomy. There are no findings suspicious for malignancy. Images were processed with CAD. IMPRESSION: No mammographic evidence of malignancy. A result letter of this screening mammogram will be mailed directly to the patient. RECOMMENDATION: Screening mammogram in one year.  (Code:SM-R-44M) BI-RADS CATEGORY  1: Negative. Electronically Signed   By: Dorise Bullion III  M.D   On: 09/01/2016 10:25       Assessment & Plan:   Problem List Items Addressed This Visit    Abnormal liver function    Stable.  Follow liver panel.       Confusion    Increased confusion with her medications, etc.  Also some memory change, etc.  Will obtain MRI brain.  Also have neurology evaluate.  Pt in agrement.        Hypercholesterolemia    Off her cholesterol medication.  Needs to restart.  Follow lipid panel and liver function tests.        Osteoporosis    Off fosamax.  Will restart.  Follow.       Primary cancer of upper outer quadrant of left female breast ([place])    Followed by oncology.  Discussed contacting his office to confirm if can stop letrozole.        Relevant Medications   letrozole (FEMARA) 2.5 MG tablet    Other Visit Diagnoses    Memory change    -  Primary   Relevant Orders   Vitamin B12 (Completed)   MR Brain W Wo Contrast   Ambulatory referral to Neurology   Need for pneumococcal vaccination       Relevant Orders   Pneumococcal conjugate vaccine 13-valent IM (Completed)       Einar Pheasant, MD

## 2016-10-07 ENCOUNTER — Telehealth: Payer: Self-pay | Admitting: Internal Medicine

## 2016-10-07 NOTE — Telephone Encounter (Signed)
Reviewed.  Offered to see pt today as a work in appt.  She declines.  Will try some conservative measures and call on Monday if persistent problems.

## 2016-10-07 NOTE — Telephone Encounter (Signed)
Patient came into clinic with C/O back pain in lower midline of back, stated she had advised PCP on 10/05/16 of back . Hurts with movement such as walking, or yard work. Patient stated if sitting back does not hurt. Attained vitals,  T 97.6 02 96 pulse 64 BP 118/60. Patient stated she was advised by PCP on 10/05/16 that she could get a shot if her back continued to hurt, advised patient that PCP meant she could refer her for an injection. Advised patient I would speak with PCP.  PCP advised to work patient in and could attain X-ray if needed and make referral for  Injection, if needed. Advised patient of plan ,but that she would not get injection att visit, patient walked around room and decided she would wait and declined appointment  for toady. Patient will advise on Monday as to back pain and if appointment needed.

## 2016-10-12 ENCOUNTER — Other Ambulatory Visit (INDEPENDENT_AMBULATORY_CARE_PROVIDER_SITE_OTHER): Payer: Medicare Other

## 2016-10-12 ENCOUNTER — Ambulatory Visit: Payer: Medicare Other

## 2016-10-12 ENCOUNTER — Telehealth: Payer: Self-pay | Admitting: Internal Medicine

## 2016-10-12 DIAGNOSIS — M545 Low back pain, unspecified: Secondary | ICD-10-CM

## 2016-10-12 DIAGNOSIS — R300 Dysuria: Secondary | ICD-10-CM

## 2016-10-12 LAB — POCT URINALYSIS DIPSTICK
Bilirubin, UA: NEGATIVE
Glucose, UA: NEGATIVE
Ketones, UA: NEGATIVE
Leukocytes, UA: NEGATIVE
Nitrite, UA: NEGATIVE
RBC UA: NEGATIVE
SPEC GRAV UA: 1.025 (ref 1.010–1.025)
UROBILINOGEN UA: 0.2 U/dL
pH, UA: 5 (ref 5.0–8.0)

## 2016-10-12 LAB — URINALYSIS, MICROSCOPIC ONLY: RBC / HPF: NONE SEEN (ref 0–?)

## 2016-10-12 NOTE — Telephone Encounter (Signed)
Discussed with Juliann Pulse.  Pt in no acute distress.  Can schedule appt.  Will check urine.  Keep appt for MRI brain.

## 2016-10-12 NOTE — Telephone Encounter (Signed)
Patient came into office , stating her husband had advised her that she needed to come by the office for an appointment. Patient stated she had back pain last week but pain today was rated at 0 on scale of 0 -10. Patient stated she had back pain the night before after raking leaves and had difficulty falling a sleep , then patient stated that was last week.   Nurse , ask patient name, date of birth, patient stated name quickly without hesitation but hesitated with date of birth.  Nurse then ask patient why was she here today and she could only say that Deidre Ala (spouse) had advised her to come, That she was to talk to Dr. Nicki Reaper about something. Patient could only relate that PCP wanted her to see someone.  Attained vitals T 97.6 pulse 59, 02 sat  97, BP 148/60. Nurse advised patient she would speak with PCP, discussed patient back pain, and patient confusion as to why she was in office. PCP advised we would get UA on patient and set up follow up with PCP . Nurse advised patient of plan and patient seemed very satisfied with the plan of care.  Nurse attained a list of appointment reminders for patient and wrote out dates and times, that had been scheduled in previous appointment.

## 2016-10-13 LAB — URINE CULTURE: Organism ID, Bacteria: NO GROWTH

## 2016-10-16 ENCOUNTER — Encounter: Payer: Self-pay | Admitting: Internal Medicine

## 2016-10-16 DIAGNOSIS — R410 Disorientation, unspecified: Secondary | ICD-10-CM | POA: Insufficient documentation

## 2016-10-16 NOTE — Assessment & Plan Note (Signed)
Followed by oncology.  Discussed contacting his office to confirm if can stop letrozole.

## 2016-10-16 NOTE — Assessment & Plan Note (Signed)
Off fosamax.  Will restart.  Follow.

## 2016-10-16 NOTE — Assessment & Plan Note (Signed)
Increased confusion with her medications, etc.  Also some memory change, etc.  Will obtain MRI brain.  Also have neurology evaluate.  Pt in agrement.

## 2016-10-16 NOTE — Assessment & Plan Note (Signed)
Off her cholesterol medication.  Needs to restart.  Follow lipid panel and liver function tests.

## 2016-10-16 NOTE — Assessment & Plan Note (Signed)
Stable.  Follow liver panel.

## 2016-10-17 ENCOUNTER — Telehealth: Payer: Self-pay | Admitting: *Deleted

## 2016-10-17 NOTE — Telephone Encounter (Signed)
[facility] pharmacy has requested a updated medication list.  Contact [phone]

## 2016-10-20 ENCOUNTER — Ambulatory Visit (INDEPENDENT_AMBULATORY_CARE_PROVIDER_SITE_OTHER): Payer: Medicare Other | Admitting: *Deleted

## 2016-10-20 DIAGNOSIS — E538 Deficiency of other specified B group vitamins: Secondary | ICD-10-CM | POA: Diagnosis not present

## 2016-10-20 MED ORDER — CYANOCOBALAMIN 1000 MCG/ML IJ SOLN
1000.0000 ug | Freq: Once | INTRAMUSCULAR | Status: AC
  Administered 2016-10-20: 1000 ug via INTRAMUSCULAR

## 2016-10-20 NOTE — Telephone Encounter (Signed)
Faxed med list to pharmacy

## 2016-10-20 NOTE — Progress Notes (Addendum)
Patient presented for b 12 injection to right deltoid, patient voiced no concerns and showed no sign of distress during injection.   Reviewed.  Dr Nicki Reaper

## 2016-10-21 ENCOUNTER — Ambulatory Visit
Admission: RE | Admit: 2016-10-21 | Discharge: 2016-10-21 | Disposition: A | Discharge status: Home / Self Care | Payer: Medicare Other | Source: Ambulatory Visit | Attending: Internal Medicine | Admitting: Internal Medicine

## 2016-10-21 DIAGNOSIS — R413 Other amnesia: Secondary | ICD-10-CM | POA: Diagnosis not present

## 2016-10-21 DIAGNOSIS — R410 Disorientation, unspecified: Secondary | ICD-10-CM | POA: Diagnosis not present

## 2016-10-21 MED ORDER — GADOBENATE DIMEGLUMINE 529 MG/ML IV SOLN
10.0000 mL | Freq: Once | INTRAVENOUS | Status: AC | PRN
  Administered 2016-10-21: 8 mL via INTRAVENOUS

## 2016-10-27 ENCOUNTER — Ambulatory Visit (INDEPENDENT_AMBULATORY_CARE_PROVIDER_SITE_OTHER): Payer: Medicare Other | Admitting: *Deleted

## 2016-10-27 DIAGNOSIS — E538 Deficiency of other specified B group vitamins: Secondary | ICD-10-CM | POA: Diagnosis not present

## 2016-10-27 MED ORDER — CYANOCOBALAMIN 1000 MCG/ML IJ SOLN
1000.0000 ug | Freq: Once | INTRAMUSCULAR | Status: AC
  Administered 2016-10-27: 1000 ug via INTRAMUSCULAR

## 2016-10-27 NOTE — Progress Notes (Addendum)
Patient presented for B 12 injection to left deltoid patient voiced no concern, nor showed any signs of distress during injection.  Reviewed.  Dr Nicki Reaper

## 2016-11-03 ENCOUNTER — Ambulatory Visit (INDEPENDENT_AMBULATORY_CARE_PROVIDER_SITE_OTHER): Payer: Medicare Other

## 2016-11-03 DIAGNOSIS — E538 Deficiency of other specified B group vitamins: Secondary | ICD-10-CM | POA: Diagnosis not present

## 2016-11-03 MED ORDER — CYANOCOBALAMIN 1000 MCG/ML IJ SOLN
1000.0000 ug | Freq: Once | INTRAMUSCULAR | Status: AC
  Administered 2016-11-03: 1000 ug via INTRAMUSCULAR

## 2016-11-03 NOTE — Progress Notes (Addendum)
Patient came in for B12 injection, received in Right deltoid.  Patient tolerated well.   Reviewed.  Dr Nicki Reaper

## 2016-11-04 ENCOUNTER — Ambulatory Visit (INDEPENDENT_AMBULATORY_CARE_PROVIDER_SITE_OTHER): Payer: Medicare Other | Admitting: Internal Medicine

## 2016-11-04 ENCOUNTER — Ambulatory Visit (INDEPENDENT_AMBULATORY_CARE_PROVIDER_SITE_OTHER): Payer: Medicare Other

## 2016-11-04 ENCOUNTER — Encounter: Payer: Self-pay | Admitting: Internal Medicine

## 2016-11-04 DIAGNOSIS — K7689 Other specified diseases of liver: Secondary | ICD-10-CM | POA: Diagnosis not present

## 2016-11-04 DIAGNOSIS — H353290 Exudative age-related macular degeneration, unspecified eye, stage unspecified: Secondary | ICD-10-CM | POA: Diagnosis not present

## 2016-11-04 DIAGNOSIS — M545 Low back pain, unspecified: Secondary | ICD-10-CM | POA: Insufficient documentation

## 2016-11-04 DIAGNOSIS — R410 Disorientation, unspecified: Secondary | ICD-10-CM | POA: Diagnosis not present

## 2016-11-04 DIAGNOSIS — E7800 Pure hypercholesterolemia, unspecified: Secondary | ICD-10-CM

## 2016-11-04 DIAGNOSIS — M48061 Spinal stenosis, lumbar region without neurogenic claudication: Secondary | ICD-10-CM | POA: Diagnosis not present

## 2016-11-04 DIAGNOSIS — R945 Abnormal results of liver function studies: Secondary | ICD-10-CM

## 2016-11-04 NOTE — Progress Notes (Signed)
Pre-visit discussion using our clinic review tool. No additional management support is needed unless otherwise documented below in the visit note.  

## 2016-11-04 NOTE — Progress Notes (Signed)
Patient ID: Cassidy Martinez, female   DOB: Mar 16, 1932, 83 y.o.   MRN: [id number]   Subjective:    Patient ID: Cassidy Martinez, female    DOB: 1931-11-26, 83 y.o.   MRN: [id number]  HPI  Patient here for a scheduled follow up.  She reports she is having some low back pain.  Worse at the end of the day.  No radiation of pain.  No chest pain.  Stays active.  Still with increased confusion about her medications.  See last note.  We discussed this at length today again.  Had MRI.  Unrevealing.  Had discussed neurology evaluation.  She was in agreement.  Eating.  Bowels moving.     Past Medical History:  Diagnosis Date  . Arthritis    hands  . Breast CA ([place])    left;; S/P mastectomy 08/12/11  . Carpal tunnel syndrome, bilateral   . Heart murmur   . History of UTI    "have had a few over the years; haven't had once since ~ 2007"  . Hyperlipidemia   . Migraines    "used to have them severe; stopped when I took self off estrogen"  . PONV (postoperative nausea and vomiting)    Past Surgical History:  Procedure Laterality Date  . ABDOMINAL HYSTERECTOMY  ~ 1983  . APPENDECTOMY    . BREAST LUMPECTOMY  03/2009   "abnormal cells"; right  . CARPAL TUNNEL RELEASE     right; "I've had it operated on twice"  . EYE SURGERY  2006   repair of tear in retina  . MASTECTOMY COMPLETE / SIMPLE W/ SENTINEL NODE BIOPSY  08/12/11   axillary SNB; left  . MASTECTOMY W/ SENTINEL NODE BIOPSY  08/12/2011   Procedure: MASTECTOMY WITH SENTINEL LYMPH NODE BIOPSY;  Surgeon: Adin Hector, MD;  Location: [facility];  Service: General;  Laterality: Left;  . NASAL SINUS SURGERY    . SCALP MASS REMOVAL  2007   "knot; benign"   Family History  Problem Relation Age of Onset  . Stroke Mother   . Cancer Mother        ovarian  . Arthritis Father   . Cancer Maternal Aunt        breast  . Cancer Paternal Aunt        breast  . Dementia Brother   . Anesthesia problems Neg Hx    Social History   Social History  . Marital  status: Married    Spouse name: N/A  . Number of children: 0  . Years of education: N/A   Social History Main Topics  . Smoking status: Never Smoker  . Smokeless tobacco: Never Used  . Alcohol use No  . Drug use: No  . Sexual activity: Yes   Other Topics Concern  . None   Social History Narrative  . None    Outpatient Encounter Prescriptions as of 11/04/2016  Medication Sig  . alendronate (FOSAMAX) 70 MG tablet TAKE 1 TABLET EVERY 7 DAYS WITH A FULL GLASS OF WATER ON AN EMPTY STOMACH DO NOT LIE DOWN FOR AT LEAST 30 MIN (Patient not taking: Reported on 10/05/2016)  . Multiple Vitamins-Minerals (PRESERVISION AREDS 2 PO) Take by mouth.  . rosuvastatin (CRESTOR) 5 MG tablet Take 1 tablet (5 mg total) by mouth daily. (Patient not taking: Reported on 10/05/2016)  . [DISCONTINUED] CALCIUM PO Take 1,000 mg by mouth daily.  . [DISCONTINUED] Cholecalciferol (VITAMIN D-3 PO) Take 1 capsule by mouth 2 (two)  times daily.   . [DISCONTINUED] letrozole (FEMARA) 2.5 MG tablet Take 1 tablet by mouth daily.   No facility-administered encounter medications on file as of 11/04/2016.     Review of Systems  Constitutional: Negative for appetite change and unexpected weight change.  HENT: Negative for congestion and sinus pressure.   Respiratory: Negative for cough, chest tightness and shortness of breath.   Cardiovascular: Negative for chest pain, palpitations and leg swelling.  Gastrointestinal: Negative for abdominal pain, diarrhea, nausea and vomiting.  Genitourinary: Negative for difficulty urinating and dysuria.  Musculoskeletal: Positive for back pain. Negative for joint swelling.  Skin: Negative for color change and rash.  Neurological: Negative for dizziness, light-headedness and headaches.  Psychiatric/Behavioral: Negative for agitation and dysphoric mood.       Objective:    Physical Exam  Constitutional: She appears well-developed and well-nourished. No distress.  HENT:  Nose: Nose  normal.  Mouth/Throat: Oropharynx is clear and moist.  Neck: Neck supple. No thyromegaly present.  Cardiovascular: Normal rate and regular rhythm.   Pulmonary/Chest: Breath sounds normal. No respiratory distress. She has no wheezes.  Abdominal: Soft. Bowel sounds are normal. There is no tenderness.  Musculoskeletal: She exhibits no edema or tenderness.  Negative SLR.    Lymphadenopathy:    She has no cervical adenopathy.  Skin: No rash noted. No erythema.  Psychiatric: She has a normal mood and affect. Her behavior is normal.    BP 120/60 (BP Location: Left Arm, Patient Position: Sitting, Cuff Size: Normal)   Pulse 60   Temp 98.6 F (37 C) (Oral)   Resp 12   Ht 5\' 4"  (1.626 m)   Wt 100 lb 12.8 oz (45.7 kg)   SpO2 97%   BMI 17.30 kg/m  Wt Readings from Last 3 Encounters:  11/04/16 100 lb 12.8 oz (45.7 kg)  10/05/16 97 lb 9.6 oz (44.3 kg)  08/26/16 96 lb 12.8 oz (43.9 kg)     Lab Results  Component Value Date   WBC 6.1 10/03/2016   HGB 14.2 10/03/2016   HCT 43.1 10/03/2016   PLT 193.0 10/03/2016   GLUCOSE 92 10/03/2016   CHOL 315 (H) 10/03/2016   TRIG 71.0 10/03/2016   HDL 107.30 10/03/2016   LDLDIRECT 174.2 07/09/2013   LDLCALC 194 (H) 10/03/2016   ALT 18 10/03/2016   AST 51 (H) 10/03/2016   NA 140 10/03/2016   K 4.2 10/03/2016   CL 104 10/03/2016   CREATININE 0.98 10/03/2016   BUN 20 10/03/2016   CO2 30 10/03/2016   TSH 1.91 10/03/2016    Mr Brain W Wo Contrast  Result Date: 10/21/2016 CLINICAL DATA:  83 year old female with memory changes and confusion. EXAM: MRI HEAD WITHOUT AND WITH CONTRAST TECHNIQUE: Multiplanar, multiecho pulse sequences of the brain and surrounding structures were obtained without and with intravenous contrast. CONTRAST:  81mL MULTIHANCE GADOBENATE DIMEGLUMINE 529 MG/ML IV SOLN COMPARISON:  Report of 1999 brain MRI (04/28/1998 - no images available). FINDINGS: Brain: Cerebral volume is within normal limits for age. No restricted diffusion  to suggest acute infarction. No midline shift, mass effect, evidence of mass lesion, ventriculomegaly, extra-axial collection or acute intracranial hemorrhage. Cervicomedullary junction and pituitary are within normal limits. Minimal to mild for age nonspecific periventricular cerebral white matter T2 and FLAIR hyperintensity. No cortical encephalomalacia or chronic cerebral blood products identified. Deep gray matter nuclei, brainstem, and cerebellum are normal. Temporal lobe structures appear normal for age. No abnormal enhancement identified. No dural thickening. Vascular: Major intracranial vascular  flow voids are preserved, with dominant appearing distal left vertebral artery. Skull and upper cervical spine: Negative for age. Normal bone marrow signal. Sinuses/Orbits: Normal orbits soft tissues. Postoperative changes to the maxillary sinuses and nasal cavity. Occasional mild sinus mucosal thickening. Other: Mastoids are clear. Visible internal auditory structures appear normal. Negative scalp soft tissues. IMPRESSION: No acute intracranial abnormality and largely unremarkable for age MRI appearance of the brain. Electronically Signed   By: Genevie Ann M.D.   On: 10/21/2016 14:33       Assessment & Plan:   Problem List Items Addressed This Visit    Abnormal liver function    Stable.  Follow.        AMD (age-related macular degeneration), wet ([place])    Followed by opthalmology.        Confusion    Increased confusion as outlined.  See last note as well.  MRI - unrevealing.  Refer to neurology as outlined.        Hypercholesterolemia    Off cholesterol medication.  Instructed to restart.        Low back pain    Persistent low back pain.  Check xray.        Relevant Orders   DG Lumbar Spine 2-3 Views (Completed)       Einar Pheasant, MD

## 2016-11-06 ENCOUNTER — Encounter: Payer: Self-pay | Admitting: Internal Medicine

## 2016-11-06 NOTE — Assessment & Plan Note (Signed)
Increased confusion as outlined.  See last note as well.  MRI - unrevealing.  Refer to neurology as outlined.

## 2016-11-06 NOTE — Assessment & Plan Note (Signed)
Followed by opthalmology.       

## 2016-11-06 NOTE — Assessment & Plan Note (Signed)
Stable.  Follow.   

## 2016-11-06 NOTE — Assessment & Plan Note (Signed)
Off cholesterol medication.  Instructed to restart.

## 2016-11-06 NOTE — Assessment & Plan Note (Signed)
Persistent low back pain.  Check xray.

## 2016-11-10 DIAGNOSIS — H52223 Regular astigmatism, bilateral: Secondary | ICD-10-CM | POA: Diagnosis not present

## 2016-11-10 DIAGNOSIS — H5203 Hypermetropia, bilateral: Secondary | ICD-10-CM | POA: Diagnosis not present

## 2016-11-10 DIAGNOSIS — H35352 Cystoid macular degeneration, left eye: Secondary | ICD-10-CM | POA: Diagnosis not present

## 2016-11-10 DIAGNOSIS — H25813 Combined forms of age-related cataract, bilateral: Secondary | ICD-10-CM | POA: Diagnosis not present

## 2016-11-28 DIAGNOSIS — H353221 Exudative age-related macular degeneration, left eye, with active choroidal neovascularization: Secondary | ICD-10-CM | POA: Diagnosis not present

## 2016-11-28 DIAGNOSIS — H353112 Nonexudative age-related macular degeneration, right eye, intermediate dry stage: Secondary | ICD-10-CM | POA: Diagnosis not present

## 2016-12-13 ENCOUNTER — Ambulatory Visit: Payer: Medicare Other | Admitting: Internal Medicine

## 2016-12-16 ENCOUNTER — Encounter: Payer: Self-pay | Admitting: Internal Medicine

## 2016-12-16 ENCOUNTER — Ambulatory Visit (INDEPENDENT_AMBULATORY_CARE_PROVIDER_SITE_OTHER): Payer: Medicare Other | Admitting: Internal Medicine

## 2016-12-16 DIAGNOSIS — E7800 Pure hypercholesterolemia, unspecified: Secondary | ICD-10-CM

## 2016-12-16 DIAGNOSIS — K7689 Other specified diseases of liver: Secondary | ICD-10-CM | POA: Diagnosis not present

## 2016-12-16 DIAGNOSIS — M810 Age-related osteoporosis without current pathological fracture: Secondary | ICD-10-CM

## 2016-12-16 DIAGNOSIS — M545 Low back pain, unspecified: Secondary | ICD-10-CM

## 2016-12-16 DIAGNOSIS — C50412 Malignant neoplasm of upper-outer quadrant of left female breast: Secondary | ICD-10-CM | POA: Diagnosis not present

## 2016-12-16 DIAGNOSIS — R945 Abnormal results of liver function studies: Secondary | ICD-10-CM

## 2016-12-16 DIAGNOSIS — E538 Deficiency of other specified B group vitamins: Secondary | ICD-10-CM

## 2016-12-16 DIAGNOSIS — H353290 Exudative age-related macular degeneration, unspecified eye, stage unspecified: Secondary | ICD-10-CM

## 2016-12-16 DIAGNOSIS — R410 Disorientation, unspecified: Secondary | ICD-10-CM

## 2016-12-16 MED ORDER — CYANOCOBALAMIN 1000 MCG/ML IJ SOLN
1000.0000 ug | Freq: Once | INTRAMUSCULAR | Status: AC
  Administered 2016-12-16: 1000 ug via INTRAMUSCULAR

## 2016-12-16 NOTE — Progress Notes (Signed)
Pre-visit discussion using our clinic review tool. No additional management support is needed unless otherwise documented below in the visit note.  

## 2016-12-16 NOTE — Progress Notes (Signed)
Patient ID: Cassidy Martinez, female   DOB: 09/05/1931, 83 y.o.   MRN: [id number]   Subjective:    Patient ID: Cassidy Martinez, female    DOB: April 15, 1932, 83 y.o.   MRN: [id number]  HPI  Patient here for a scheduled follow up.  She reports she is doing some better.  Reports no back pain.  States she is not taking any medications.  We have discussed this on several visits.  I have written down her medications and gone over her medications she is supposed to be taking.  She is still not taking her medications.  We have discussed her memory change.  Had recent MRI.  Unrevealing of a cause.  She is receiving B12 injections.  Missed her last b12 injection.  Has an appt scheduled with neurology to discuss.  No chest pain.  No sob.  No acid reflux.  No abdominal pain.  Bowels moving.  Weight stable.     Past Medical History:  Diagnosis Date  . Arthritis    hands  . Breast CA ([place])    left;; S/P mastectomy 08/12/11  . Carpal tunnel syndrome, bilateral   . Heart murmur   . History of UTI    "have had a few over the years; haven't had once since ~ 2007"  . Hyperlipidemia   . Migraines    "used to have them severe; stopped when I took self off estrogen"  . PONV (postoperative nausea and vomiting)    Past Surgical History:  Procedure Laterality Date  . ABDOMINAL HYSTERECTOMY  ~ 1983  . APPENDECTOMY    . BREAST LUMPECTOMY  03/2009   "abnormal cells"; right  . CARPAL TUNNEL RELEASE     right; "I've had it operated on twice"  . EYE SURGERY  2006   repair of tear in retina  . MASTECTOMY COMPLETE / SIMPLE W/ SENTINEL NODE BIOPSY  08/12/11   axillary SNB; left  . MASTECTOMY W/ SENTINEL NODE BIOPSY  08/12/2011   Procedure: MASTECTOMY WITH SENTINEL LYMPH NODE BIOPSY;  Surgeon: Adin Hector, MD;  Location: [facility];  Service: General;  Laterality: Left;  . NASAL SINUS SURGERY    . SCALP MASS REMOVAL  2007   "knot; benign"   Family History  Problem Relation Age of Onset  . Stroke Mother   . Cancer Mother          ovarian  . Arthritis Father   . Cancer Maternal Aunt        breast  . Cancer Paternal Aunt        breast  . Dementia Brother   . Anesthesia problems Neg Hx    Social History   Social History  . Marital status: Married    Spouse name: N/A  . Number of children: 0  . Years of education: N/A   Social History Main Topics  . Smoking status: Never Smoker  . Smokeless tobacco: Never Used  . Alcohol use No  . Drug use: No  . Sexual activity: Yes   Other Topics Concern  . None   Social History Narrative  . None    Outpatient Encounter Prescriptions as of 12/16/2016  Medication Sig  . alendronate (FOSAMAX) 70 MG tablet TAKE 1 TABLET EVERY 7 DAYS WITH A FULL GLASS OF WATER ON AN EMPTY STOMACH DO NOT LIE DOWN FOR AT LEAST 30 MIN (Patient not taking: Reported on 12/16/2016)  . Multiple Vitamins-Minerals (PRESERVISION AREDS 2 PO) Take by mouth.  . rosuvastatin (  CRESTOR) 5 MG tablet Take 1 tablet (5 mg total) by mouth daily. (Patient not taking: Reported on 10/05/2016)  . [EXPIRED] cyanocobalamin ((VITAMIN B-12)) injection 1,000 mcg    No facility-administered encounter medications on file as of 12/16/2016.     Review of Systems  Constitutional: Negative for appetite change and unexpected weight change.  HENT: Negative for congestion and sinus pressure.   Respiratory: Negative for cough, chest tightness and shortness of breath.   Cardiovascular: Negative for chest pain, palpitations and leg swelling.  Gastrointestinal: Negative for abdominal pain, diarrhea, nausea and vomiting.  Genitourinary: Negative for difficulty urinating and dysuria.  Musculoskeletal: Negative for back pain and joint swelling.  Skin: Negative for color change and rash.  Neurological: Negative for dizziness, light-headedness and headaches.       Memory change as outlined.    Psychiatric/Behavioral: Negative for agitation and dysphoric mood.       Objective:    Physical Exam  Constitutional: She  appears well-developed and well-nourished. No distress.  HENT:  Nose: Nose normal.  Mouth/Throat: Oropharynx is clear and moist.  Neck: Neck supple. No thyromegaly present.  Cardiovascular: Normal rate and regular rhythm.   Pulmonary/Chest: Breath sounds normal. No respiratory distress. She has no wheezes.  Abdominal: Soft. Bowel sounds are normal. There is no tenderness.  Musculoskeletal: She exhibits no edema or tenderness.  Lymphadenopathy:    She has no cervical adenopathy.  Neurological:  Able to give me the month and year.  Did not know the date.  Able to subtract serial 7s.  Able to spell WORLD backwards.  Unable to recall the name of the president or three objects after 5 minutes.    Skin: No rash noted. No erythema.  Psychiatric: She has a normal mood and affect. Her behavior is normal.    BP 118/60 (BP Location: Left Arm, Patient Position: Sitting, Cuff Size: Normal)   Pulse 63   Temp 98.5 F (36.9 C) (Oral)   Resp 12   Ht 5\' 4"  (1.626 m)   Wt 100 lb 9.6 oz (45.6 kg)   SpO2 98%   BMI 17.27 kg/m  Wt Readings from Last 3 Encounters:  12/16/16 100 lb 9.6 oz (45.6 kg)  11/04/16 100 lb 12.8 oz (45.7 kg)  10/05/16 97 lb 9.6 oz (44.3 kg)     Lab Results  Component Value Date   WBC 6.1 10/03/2016   HGB 14.2 10/03/2016   HCT 43.1 10/03/2016   PLT 193.0 10/03/2016   GLUCOSE 92 10/03/2016   CHOL 315 (H) 10/03/2016   TRIG 71.0 10/03/2016   HDL 107.30 10/03/2016   LDLDIRECT 174.2 07/09/2013   LDLCALC 194 (H) 10/03/2016   ALT 18 10/03/2016   AST 51 (H) 10/03/2016   NA 140 10/03/2016   K 4.2 10/03/2016   CL 104 10/03/2016   CREATININE 0.98 10/03/2016   BUN 20 10/03/2016   CO2 30 10/03/2016   TSH 1.91 10/03/2016    Mr Brain W Wo Contrast  Result Date: 10/21/2016 CLINICAL DATA:  83 year old female with memory changes and confusion. EXAM: MRI HEAD WITHOUT AND WITH CONTRAST TECHNIQUE: Multiplanar, multiecho pulse sequences of the brain and surrounding structures were  obtained without and with intravenous contrast. CONTRAST:  71mL MULTIHANCE GADOBENATE DIMEGLUMINE 529 MG/ML IV SOLN COMPARISON:  Report of 1999 brain MRI (04/28/1998 - no images available). FINDINGS: Brain: Cerebral volume is within normal limits for age. No restricted diffusion to suggest acute infarction. No midline shift, mass effect, evidence of mass lesion, ventriculomegaly,  extra-axial collection or acute intracranial hemorrhage. Cervicomedullary junction and pituitary are within normal limits. Minimal to mild for age nonspecific periventricular cerebral white matter T2 and FLAIR hyperintensity. No cortical encephalomalacia or chronic cerebral blood products identified. Deep gray matter nuclei, brainstem, and cerebellum are normal. Temporal lobe structures appear normal for age. No abnormal enhancement identified. No dural thickening. Vascular: Major intracranial vascular flow voids are preserved, with dominant appearing distal left vertebral artery. Skull and upper cervical spine: Negative for age. Normal bone marrow signal. Sinuses/Orbits: Normal orbits soft tissues. Postoperative changes to the maxillary sinuses and nasal cavity. Occasional mild sinus mucosal thickening. Other: Mastoids are clear. Visible internal auditory structures appear normal. Negative scalp soft tissues. IMPRESSION: No acute intracranial abnormality and largely unremarkable for age MRI appearance of the brain. Electronically Signed   By: Genevie Ann M.D.   On: 10/21/2016 14:33       Assessment & Plan:   Problem List Items Addressed This Visit    Abnormal liver function    Stable.  Follow.        AMD (age-related macular degeneration), wet ([place])    Followed by opthalmology.        Confusion    Increased memory change and previous confusion as outlined.  See previous notes.  MRI unrevealing.  Discussed again with her today.  She has neurology appt scheduled.        Hypercholesterolemia    Off cholesterol medication.  Have  instructed her to restart.        Low back pain    No pain reported today.  Follow.       Osteoporosis    Not taking fosamax.  Have instructed her to restart.  Follow.       Primary cancer of upper outer quadrant of left female breast [facility])    Has been followed by oncology.  Off letrozole.         Other Visit Diagnoses    Vitamin B12 deficiency       Relevant Medications   cyanocobalamin ((VITAMIN B-12)) injection 1,000 mcg (Completed)       Einar Pheasant, MD

## 2016-12-18 ENCOUNTER — Encounter: Payer: Self-pay | Admitting: Internal Medicine

## 2016-12-18 NOTE — Assessment & Plan Note (Signed)
No pain reported today.  Follow.

## 2016-12-18 NOTE — Assessment & Plan Note (Signed)
Stable.  Follow.   

## 2016-12-18 NOTE — Assessment & Plan Note (Signed)
Followed by opthalmology.       

## 2016-12-18 NOTE — Assessment & Plan Note (Signed)
Has been followed by oncology.  Off letrozole.

## 2016-12-18 NOTE — Assessment & Plan Note (Signed)
Off cholesterol medication.  Have instructed her to restart.

## 2016-12-18 NOTE — Assessment & Plan Note (Signed)
Increased memory change and previous confusion as outlined.  See previous notes.  MRI unrevealing.  Discussed again with her today.  She has neurology appt scheduled.

## 2016-12-18 NOTE — Assessment & Plan Note (Signed)
Not taking fosamax.  Have instructed her to restart.  Follow.

## 2016-12-26 DIAGNOSIS — H2513 Age-related nuclear cataract, bilateral: Secondary | ICD-10-CM | POA: Diagnosis not present

## 2016-12-29 DIAGNOSIS — M7731 Calcaneal spur, right foot: Secondary | ICD-10-CM | POA: Diagnosis not present

## 2016-12-29 DIAGNOSIS — M722 Plantar fascial fibromatosis: Secondary | ICD-10-CM | POA: Diagnosis not present

## 2017-01-10 DIAGNOSIS — H353221 Exudative age-related macular degeneration, left eye, with active choroidal neovascularization: Secondary | ICD-10-CM | POA: Diagnosis not present

## 2017-01-18 ENCOUNTER — Ambulatory Visit (INDEPENDENT_AMBULATORY_CARE_PROVIDER_SITE_OTHER): Payer: Medicare Other

## 2017-01-18 DIAGNOSIS — E538 Deficiency of other specified B group vitamins: Secondary | ICD-10-CM | POA: Diagnosis not present

## 2017-01-18 MED ORDER — CYANOCOBALAMIN 1000 MCG/ML IJ SOLN
1000.0000 ug | Freq: Once | INTRAMUSCULAR | Status: AC
  Administered 2017-01-18: 1000 ug via INTRAMUSCULAR

## 2017-01-18 NOTE — Progress Notes (Addendum)
Patient came in for b12 injection, received in left deltoid.  Patient tolerated well, no complaints.   Reviewed.  Dr Nicki Reaper

## 2017-02-15 DIAGNOSIS — H353221 Exudative age-related macular degeneration, left eye, with active choroidal neovascularization: Secondary | ICD-10-CM | POA: Diagnosis not present

## 2017-02-15 DIAGNOSIS — H2512 Age-related nuclear cataract, left eye: Secondary | ICD-10-CM | POA: Diagnosis not present

## 2017-02-22 ENCOUNTER — Encounter: Payer: Self-pay | Admitting: *Deleted

## 2017-02-27 ENCOUNTER — Telehealth: Payer: Self-pay | Admitting: Internal Medicine

## 2017-02-27 ENCOUNTER — Encounter: Payer: Self-pay | Admitting: Internal Medicine

## 2017-02-27 ENCOUNTER — Ambulatory Visit (INDEPENDENT_AMBULATORY_CARE_PROVIDER_SITE_OTHER): Payer: Medicare Other | Admitting: Internal Medicine

## 2017-02-27 VITALS — BP 110/58 | HR 77 | Temp 98.6°F | Resp 14 | Ht 64.0 in | Wt 98.8 lb

## 2017-02-27 DIAGNOSIS — F439 Reaction to severe stress, unspecified: Secondary | ICD-10-CM

## 2017-02-27 DIAGNOSIS — M810 Age-related osteoporosis without current pathological fracture: Secondary | ICD-10-CM

## 2017-02-27 DIAGNOSIS — Z01818 Encounter for other preprocedural examination: Secondary | ICD-10-CM | POA: Diagnosis not present

## 2017-02-27 DIAGNOSIS — C50412 Malignant neoplasm of upper-outer quadrant of left female breast: Secondary | ICD-10-CM | POA: Diagnosis not present

## 2017-02-27 DIAGNOSIS — E7800 Pure hypercholesterolemia, unspecified: Secondary | ICD-10-CM

## 2017-02-27 DIAGNOSIS — R413 Other amnesia: Secondary | ICD-10-CM | POA: Diagnosis not present

## 2017-02-27 DIAGNOSIS — R030 Elevated blood-pressure reading, without diagnosis of hypertension: Secondary | ICD-10-CM

## 2017-02-27 NOTE — Telephone Encounter (Signed)
Pt needs a refill on her rosuvastatin (CRESTOR) 5 MG , She said that Dr. Nicki Reaper wanted her to start back on her cholesterol and pt is out. Please call pt.

## 2017-02-27 NOTE — Telephone Encounter (Signed)
Called patient checked she has some at home and she will start taking now.

## 2017-02-27 NOTE — Patient Instructions (Signed)
Restart cholesterol medication 

## 2017-02-27 NOTE — Progress Notes (Signed)
Patient ID: Cassidy Martinez, female   DOB: 12/10/1931, 83 y.o.   MRN: [id number]   Subjective:    Patient ID: Cassidy Martinez, female    DOB: 12/01/1931, 83 y.o.   MRN: [id number]  HPI  Patient here for a scheduled follow up.  She reports she is doing better.  Husband is doing better.  Stays active.  No chest pain.  No sob.  No acid reflux.  No abdominal pain.  Bowels moving.  States planning to have eye surgery.  Not sure of date.  She is still having some memory issues.  Discussed with her today.  She has not seen neurology.  Still not taking any medication.  Discussed again with her today.  She needs to start back on her cholesterol medication.  She is agreeable.  She is eating.  States has a good appetite.  No nausea or vomiting.     Past Medical History:  Diagnosis Date  . Arthritis    hands  . Breast CA ([place])    left;; S/P mastectomy 08/12/11  . Carpal tunnel syndrome, bilateral   . Heart murmur   . History of UTI    "have had a few over the years; haven't had once since ~ 2007"  . HOH (hard of hearing)   . Hyperlipidemia   . Migraines    "used to have them severe; stopped when I took self off estrogen"  . Skin cancer    Past Surgical History:  Procedure Laterality Date  . ABDOMINAL HYSTERECTOMY  ~ 1983  . APPENDECTOMY    . BREAST LUMPECTOMY  03/2009   "abnormal cells"; right  . CARPAL TUNNEL RELEASE     right; "I've had it operated on twice"  . EYE SURGERY  2006   repair of tear in retina  . MASTECTOMY COMPLETE / SIMPLE W/ SENTINEL NODE BIOPSY  08/12/11   axillary SNB; left  . MASTECTOMY W/ SENTINEL NODE BIOPSY  08/12/2011   Procedure: MASTECTOMY WITH SENTINEL LYMPH NODE BIOPSY;  Surgeon: Adin Hector, MD;  Location: [facility];  Service: General;  Laterality: Left;  . NASAL SINUS SURGERY    . SCALP MASS REMOVAL  2007   "knot; benign"   Family History  Problem Relation Age of Onset  . Stroke Mother   . Cancer Mother        ovarian  . Arthritis Father   . Cancer Maternal Aunt         breast  . Cancer Paternal Aunt        breast  . Dementia Brother   . Anesthesia problems Neg Hx    Social History   Social History  . Marital status: Married    Spouse name: N/A  . Number of children: 0  . Years of education: N/A   Social History Main Topics  . Smoking status: Never Smoker  . Smokeless tobacco: Never Used  . Alcohol use No  . Drug use: No  . Sexual activity: Yes   Other Topics Concern  . None   Social History Narrative  . None    Outpatient Encounter Prescriptions as of 02/27/2017  Medication Sig  . Multiple Vitamins-Minerals (PRESERVISION AREDS 2 PO) Take by mouth.  . rosuvastatin (CRESTOR) 5 MG tablet Take 1 tablet (5 mg total) by mouth daily. (Patient not taking: Reported on 10/05/2016)  . [DISCONTINUED] alendronate (FOSAMAX) 70 MG tablet TAKE 1 TABLET EVERY 7 DAYS WITH A FULL GLASS OF WATER ON AN EMPTY STOMACH  DO NOT LIE DOWN FOR AT LEAST 30 MIN (Patient not taking: Reported on 12/16/2016)   No facility-administered encounter medications on file as of 02/27/2017.     Review of Systems  Constitutional: Negative for appetite change and unexpected weight change.  HENT: Negative for congestion and sinus pressure.   Respiratory: Negative for cough, chest tightness and shortness of breath.   Cardiovascular: Negative for chest pain, palpitations and leg swelling.  Gastrointestinal: Negative for abdominal pain, diarrhea, nausea and vomiting.  Genitourinary: Negative for difficulty urinating and dysuria.  Musculoskeletal: Negative for back pain and joint swelling.  Skin: Negative for color change and rash.  Neurological: Negative for dizziness, light-headedness and headaches.  Psychiatric/Behavioral: Negative for agitation and dysphoric mood.       Objective:     Blood pressure rechecked by me prior to leaving:  138/68  Physical Exam  Constitutional: She appears well-developed and well-nourished. No distress.  HENT:  Nose: Nose normal.    Mouth/Throat: Oropharynx is clear and moist.  Neck: Neck supple. No thyromegaly present.  Cardiovascular: Normal rate and regular rhythm.   Pulmonary/Chest: Breath sounds normal. No respiratory distress. She has no wheezes.  Abdominal: Soft. Bowel sounds are normal. There is no tenderness.  Musculoskeletal: She exhibits no edema or tenderness.  Lymphadenopathy:    She has no cervical adenopathy.  Skin: No rash noted. No erythema.  Psychiatric: She has a normal mood and affect. Her behavior is normal.    BP (!) 110/58 (BP Location: Left Arm, Patient Position: Sitting, Cuff Size: Normal)   Pulse 77   Temp 98.6 F (37 C) (Oral)   Resp 14   Ht 5\' 4"  (1.626 m)   Wt 98 lb 12.8 oz (44.8 kg)   SpO2 98%   BMI 16.96 kg/m  Wt Readings from Last 3 Encounters:  02/27/17 98 lb 12.8 oz (44.8 kg)  12/16/16 100 lb 9.6 oz (45.6 kg)  11/04/16 100 lb 12.8 oz (45.7 kg)     Lab Results  Component Value Date   WBC 6.1 10/03/2016   HGB 14.2 10/03/2016   HCT 43.1 10/03/2016   PLT 193.0 10/03/2016   GLUCOSE 92 10/03/2016   CHOL 315 (H) 10/03/2016   TRIG 71.0 10/03/2016   HDL 107.30 10/03/2016   LDLDIRECT 174.2 07/09/2013   LDLCALC 194 (H) 10/03/2016   ALT 18 10/03/2016   AST 51 (H) 10/03/2016   NA 140 10/03/2016   K 4.2 10/03/2016   CL 104 10/03/2016   CREATININE 0.98 10/03/2016   BUN 20 10/03/2016   CO2 30 10/03/2016   TSH 1.91 10/03/2016    Mr Brain W Wo Contrast  Result Date: 10/21/2016 CLINICAL DATA:  83 year old female with memory changes and confusion. EXAM: MRI HEAD WITHOUT AND WITH CONTRAST TECHNIQUE: Multiplanar, multiecho pulse sequences of the brain and surrounding structures were obtained without and with intravenous contrast. CONTRAST:  11mL MULTIHANCE GADOBENATE DIMEGLUMINE 529 MG/ML IV SOLN COMPARISON:  Report of 1999 brain MRI (04/28/1998 - no images available). FINDINGS: Brain: Cerebral volume is within normal limits for age. No restricted diffusion to suggest acute  infarction. No midline shift, mass effect, evidence of mass lesion, ventriculomegaly, extra-axial collection or acute intracranial hemorrhage. Cervicomedullary junction and pituitary are within normal limits. Minimal to mild for age nonspecific periventricular cerebral white matter T2 and FLAIR hyperintensity. No cortical encephalomalacia or chronic cerebral blood products identified. Deep gray matter nuclei, brainstem, and cerebellum are normal. Temporal lobe structures appear normal for age. No abnormal enhancement identified. No  dural thickening. Vascular: Major intracranial vascular flow voids are preserved, with dominant appearing distal left vertebral artery. Skull and upper cervical spine: Negative for age. Normal bone marrow signal. Sinuses/Orbits: Normal orbits soft tissues. Postoperative changes to the maxillary sinuses and nasal cavity. Occasional mild sinus mucosal thickening. Other: Mastoids are clear. Visible internal auditory structures appear normal. Negative scalp soft tissues. IMPRESSION: No acute intracranial abnormality and largely unremarkable for age MRI appearance of the brain. Electronically Signed   By: Genevie Ann M.D.   On: 10/21/2016 14:33       Assessment & Plan:   Problem List Items Addressed This Visit    Elevated blood pressure reading    Pts blood pressure slightly elevated.  On recheck prior to leaving - 138/68.  Will have her spot check her pressure.  Follow.       Hypercholesterolemia    Not on crestor.  Discussed the need to restart.  Discussed with her husband as well.  He will make sure she gets started back on the medication.  Follow lipid panel and liver function tests.        Relevant Orders   Hepatic function panel   Lipid panel   Basic metabolic panel   Memory change    Memory change as outlined.  See previous notes.  Discussed with her today.  She was oriented to day, month and year.  Unable to recall three objects, etc.  She has not seen neurology.  Will  see if can get this arranged.  She is agreeable.  Discussed with her husband as well.       Osteoporosis    Is supposed to be taking fosamax.  Still not taking.  Weight bearing exercise.  Follow.       Pre-op evaluation - Primary    Planning for upcoming eye surgery.  She is without symptoms.  No chest pain.  No sob.  EKG - SR/SB with no acute ischemic changes.  Feel she is at low risk from a cardiac standpoint to proceed with planned surgery.  Will need close intra op and post op monitoring of her heart rate and blood pressure to avoid extremes.        Relevant Orders   EKG 12-Lead (Completed)   Primary cancer of upper outer quadrant of left female breast ([place])    Followed by oncology.  Off letrozole.        Stress    Increased stress recently as outlined.  Discussed with her today.  She feels she is doing better.  Follow.           Einar Pheasant, MD

## 2017-02-28 ENCOUNTER — Encounter: Payer: Self-pay | Admitting: Internal Medicine

## 2017-02-28 DIAGNOSIS — R030 Elevated blood-pressure reading, without diagnosis of hypertension: Secondary | ICD-10-CM | POA: Insufficient documentation

## 2017-02-28 DIAGNOSIS — Z01818 Encounter for other preprocedural examination: Secondary | ICD-10-CM | POA: Insufficient documentation

## 2017-02-28 DIAGNOSIS — R413 Other amnesia: Secondary | ICD-10-CM | POA: Insufficient documentation

## 2017-02-28 NOTE — Assessment & Plan Note (Signed)
Pts blood pressure slightly elevated.  On recheck prior to leaving - 138/68.  Will have her spot check her pressure.  Follow.

## 2017-02-28 NOTE — Assessment & Plan Note (Signed)
Planning for upcoming eye surgery.  She is without symptoms.  No chest pain.  No sob.  EKG - SR/SB with no acute ischemic changes.  Feel she is at low risk from a cardiac standpoint to proceed with planned surgery.  Will need close intra op and post op monitoring of her heart rate and blood pressure to avoid extremes.

## 2017-02-28 NOTE — Assessment & Plan Note (Signed)
Followed by oncology.  Off letrozole.

## 2017-02-28 NOTE — Assessment & Plan Note (Signed)
Not on crestor.  Discussed the need to restart.  Discussed with her husband as well.  He will make sure she gets started back on the medication.  Follow lipid panel and liver function tests.

## 2017-02-28 NOTE — Assessment & Plan Note (Signed)
Memory change as outlined.  See previous notes.  Discussed with her today.  She was oriented to day, month and year.  Unable to recall three objects, etc.  She has not seen neurology.  Will see if can get this arranged.  She is agreeable.  Discussed with her husband as well.

## 2017-02-28 NOTE — Assessment & Plan Note (Signed)
Increased stress recently as outlined.  Discussed with her today.  She feels she is doing better.  Follow.

## 2017-02-28 NOTE — Assessment & Plan Note (Signed)
Is supposed to be taking fosamax.  Still not taking.  Weight bearing exercise.  Follow.

## 2017-03-02 ENCOUNTER — Ambulatory Visit
Admission: RE | Admit: 2017-03-02 | Discharge: 2017-03-02 | Disposition: A | Discharge status: Home / Self Care | Payer: Medicare Other | Source: Ambulatory Visit | Attending: Ophthalmology | Admitting: Ophthalmology

## 2017-03-02 ENCOUNTER — Encounter: Payer: Self-pay | Admitting: *Deleted

## 2017-03-02 ENCOUNTER — Ambulatory Visit: Payer: Medicare Other | Admitting: Anesthesiology

## 2017-03-02 ENCOUNTER — Encounter
Admission: RE | Disposition: A | Discharge status: Home / Self Care | Payer: Self-pay | Source: Ambulatory Visit | Attending: Ophthalmology

## 2017-03-02 ENCOUNTER — Encounter: Payer: Self-pay | Admitting: Internal Medicine

## 2017-03-02 DIAGNOSIS — Z888 Allergy status to other drugs, medicaments and biological substances status: Secondary | ICD-10-CM | POA: Diagnosis not present

## 2017-03-02 DIAGNOSIS — Z880 Allergy status to penicillin: Secondary | ICD-10-CM | POA: Insufficient documentation

## 2017-03-02 DIAGNOSIS — H2512 Age-related nuclear cataract, left eye: Secondary | ICD-10-CM | POA: Insufficient documentation

## 2017-03-02 DIAGNOSIS — M1990 Unspecified osteoarthritis, unspecified site: Secondary | ICD-10-CM | POA: Diagnosis not present

## 2017-03-02 DIAGNOSIS — Z79899 Other long term (current) drug therapy: Secondary | ICD-10-CM | POA: Insufficient documentation

## 2017-03-02 DIAGNOSIS — Z882 Allergy status to sulfonamides status: Secondary | ICD-10-CM | POA: Insufficient documentation

## 2017-03-02 DIAGNOSIS — Z85828 Personal history of other malignant neoplasm of skin: Secondary | ICD-10-CM | POA: Insufficient documentation

## 2017-03-02 DIAGNOSIS — Z9010 Acquired absence of unspecified breast and nipple: Secondary | ICD-10-CM | POA: Insufficient documentation

## 2017-03-02 DIAGNOSIS — Z881 Allergy status to other antibiotic agents status: Secondary | ICD-10-CM | POA: Insufficient documentation

## 2017-03-02 DIAGNOSIS — Z853 Personal history of malignant neoplasm of breast: Secondary | ICD-10-CM | POA: Insufficient documentation

## 2017-03-02 DIAGNOSIS — R011 Cardiac murmur, unspecified: Secondary | ICD-10-CM | POA: Insufficient documentation

## 2017-03-02 DIAGNOSIS — Z90710 Acquired absence of both cervix and uterus: Secondary | ICD-10-CM | POA: Insufficient documentation

## 2017-03-02 HISTORY — DX: Unspecified hearing loss, unspecified ear: H91.90

## 2017-03-02 HISTORY — DX: Unspecified malignant neoplasm of skin, unspecified: C44.90

## 2017-03-02 HISTORY — PX: CATARACT EXTRACTION W/PHACO: SHX586

## 2017-03-02 SURGERY — PHACOEMULSIFICATION, CATARACT, WITH IOL INSERTION
Anesthesia: General | Site: Eye | Laterality: Left | Wound class: Clean

## 2017-03-02 MED ORDER — ARMC OPHTHALMIC DILATING DROPS
OPHTHALMIC | Status: AC
  Administered 2017-03-02: 1 via OPHTHALMIC
  Filled 2017-03-02: qty 0.4

## 2017-03-02 MED ORDER — LIDOCAINE HCL (PF) 4 % IJ SOLN
INTRAMUSCULAR | Status: AC
  Filled 2017-03-02: qty 5

## 2017-03-02 MED ORDER — FENTANYL CITRATE (PF) 100 MCG/2ML IJ SOLN
INTRAMUSCULAR | Status: AC
  Filled 2017-03-02: qty 2

## 2017-03-02 MED ORDER — FENTANYL CITRATE (PF) 100 MCG/2ML IJ SOLN
INTRAMUSCULAR | Status: DC | PRN
  Administered 2017-03-02: 25 ug via INTRAVENOUS

## 2017-03-02 MED ORDER — POVIDONE-IODINE 5 % OP SOLN
OPHTHALMIC | Status: DC | PRN
  Administered 2017-03-02: 1 via OPHTHALMIC

## 2017-03-02 MED ORDER — POVIDONE-IODINE 5 % OP SOLN
OPHTHALMIC | Status: AC
  Filled 2017-03-02: qty 30

## 2017-03-02 MED ORDER — ARMC OPHTHALMIC DILATING DROPS
1.0000 "application " | OPHTHALMIC | Status: AC
  Administered 2017-03-02 (×3): 1 via OPHTHALMIC

## 2017-03-02 MED ORDER — ONDANSETRON HCL 4 MG/2ML IJ SOLN: 4.0000 mg | Freq: Once | INTRAMUSCULAR | Status: DC | PRN

## 2017-03-02 MED ORDER — MOXIFLOXACIN HCL 0.5 % OP SOLN: 1.0000 [drp] | OPHTHALMIC | Status: DC | PRN

## 2017-03-02 MED ORDER — MOXIFLOXACIN HCL 0.5 % OP SOLN
OPHTHALMIC | Status: DC | PRN
  Administered 2017-03-02: 0.2 mL via OPHTHALMIC

## 2017-03-02 MED ORDER — EPINEPHRINE PF 1 MG/ML IJ SOLN
INTRAMUSCULAR | Status: AC
  Filled 2017-03-02: qty 1

## 2017-03-02 MED ORDER — NA HYALUR & NA CHOND-NA HYALUR 0.55-0.5 ML IO KIT
PACK | INTRAOCULAR | Status: DC | PRN
  Administered 2017-03-02: 1 via INTRAOCULAR

## 2017-03-02 MED ORDER — SODIUM CHLORIDE 0.9 % IV SOLN
INTRAVENOUS | Status: DC
  Administered 2017-03-02: 07:00:00 via INTRAVENOUS

## 2017-03-02 MED ORDER — LIDOCAINE HCL (PF) 4 % IJ SOLN
INTRAOCULAR | Status: DC | PRN
  Administered 2017-03-02: 4 mL via OPHTHALMIC

## 2017-03-02 MED ORDER — BSS IO SOLN
INTRAOCULAR | Status: DC | PRN
  Administered 2017-03-02: 200 mL via INTRAOCULAR

## 2017-03-02 MED ORDER — NA CHONDROIT SULF-NA HYALURON 40-17 MG/ML IO SOLN
INTRAOCULAR | Status: AC
  Filled 2017-03-02: qty 1

## 2017-03-02 MED ORDER — MOXIFLOXACIN HCL 0.5 % OP SOLN
OPHTHALMIC | Status: AC
  Filled 2017-03-02: qty 3

## 2017-03-02 MED ORDER — FENTANYL CITRATE (PF) 100 MCG/2ML IJ SOLN: 25.0000 ug | INTRAMUSCULAR | Status: DC | PRN

## 2017-03-02 SURGICAL SUPPLY — 18 items
CLOSURE WOUND 1/2 X4 (GAUZE/BANDAGES/DRESSINGS) ×1
DISSECTOR HYDRO NUCLEUS 50X22 (MISCELLANEOUS) ×3 IMPLANT
GLOVE BIO SURGEON STRL SZ8 (GLOVE) ×3 IMPLANT
GLOVE BIOGEL M 6.5 STRL (GLOVE) ×3 IMPLANT
GLOVE SURG LX 7.5 STRW (GLOVE) ×2
GLOVE SURG LX STRL 7.5 STRW (GLOVE) ×1 IMPLANT
GOWN STRL REUS W/ TWL LRG LVL3 (GOWN DISPOSABLE) ×2 IMPLANT
GOWN STRL REUS W/TWL LRG LVL3 (GOWN DISPOSABLE) ×4
LABEL CATARACT MEDS ST (LABEL) ×3 IMPLANT
LENS IOL TECNIS ITEC 20.5 (Intraocular Lens) ×3 IMPLANT
PACK CATARACT (MISCELLANEOUS) ×3 IMPLANT
PACK CATARACT KING (MISCELLANEOUS) ×3 IMPLANT
PACK EYE AFTER SURG (MISCELLANEOUS) ×3 IMPLANT
SOL BSS BAG (MISCELLANEOUS) ×3
SOLUTION BSS BAG (MISCELLANEOUS) ×1 IMPLANT
STRIP CLOSURE SKIN 1/2X4 (GAUZE/BANDAGES/DRESSINGS) ×2 IMPLANT
WATER STERILE IRR 250ML POUR (IV SOLUTION) ×3 IMPLANT
WIPE NON LINTING 3.25X3.25 (MISCELLANEOUS) ×3 IMPLANT

## 2017-03-02 NOTE — Anesthesia Postprocedure Evaluation (Signed)
Anesthesia Post Note  Patient: Cassidy Martinez  Procedure(s) Performed: Procedure(s) (LRB): CATARACT EXTRACTION PHACO AND INTRAOCULAR LENS PLACEMENT (IOC) (Left)  Patient location during evaluation: Short Stay Anesthesia Type: MAC Level of consciousness: awake and alert Pain management: pain level controlled Vital Signs Assessment: post-procedure vital signs reviewed and stable Respiratory status: spontaneous breathing, nonlabored ventilation, respiratory function stable and patient connected to nasal cannula oxygen Cardiovascular status: stable and blood pressure returned to baseline Postop Assessment: no apparent nausea or vomiting Anesthetic complications: no     Last Vitals:  Vitals:   03/02/17 0835 03/02/17 0837  BP: (!) 155/53 (!) 155/53  Pulse: (!) 53   Resp: 16 12  Temp: (!) 36.3 C (!) 36.3 C  SpO2: 100% 100%    Last Pain:  Vitals:   03/02/17 0837  TempSrc: Temporal                 Fotios Amos,  Clearnce Sorrel

## 2017-03-02 NOTE — Anesthesia Preprocedure Evaluation (Addendum)
Anesthesia Evaluation  Patient identified by MRN, date of birth, ID band Patient awake    Reviewed: Allergy & Precautions, H&P , NPO status , Patient's Chart, lab work & pertinent test results  History of Anesthesia Complications (+) PONV  Airway Mallampati: I  TM Distance: >3 FB Neck ROM: Full    Dental no notable dental hx. (+) Teeth Intact, Dental Advisory Given   Pulmonary neg pulmonary ROS,    Pulmonary exam normal breath sounds clear to auscultation       Cardiovascular negative cardio ROS Normal cardiovascular exam+ Valvular Problems/Murmurs (possible heart murmur in past, never symptomatic )  Rhythm:Regular Rate:Normal     Neuro/Psych  Headaches,  Neuromuscular disease negative neurological ROS     GI/Hepatic negative GI ROS, Neg liver ROS,   Endo/Other  negative endocrine ROS  Renal/GU negative Renal ROS     Musculoskeletal negative musculoskeletal ROS (+) Arthritis , Osteoarthritis,    Abdominal   Peds  Hematology negative hematology ROS (+)   Anesthesia Other Findings   Reproductive/Obstetrics                             Anesthesia Physical  Anesthesia Plan  ASA: II  Anesthesia Plan: MAC   Post-op Pain Management:    Induction: Intravenous  PONV Risk Score and Plan:   Airway Management Planned: Nasal Cannula  Additional Equipment:   Intra-op Plan:   Post-operative Plan:   Informed Consent: I have reviewed the patients History and Physical, chart, labs and discussed the procedure including the risks, benefits and alternatives for the proposed anesthesia with the patient or authorized representative who has indicated his/her understanding and acceptance.   Dental advisory given  Plan Discussed with: CRNA and Surgeon  Anesthesia Plan Comments: (Plan routine monitors, GA)       Anesthesia Quick Evaluation

## 2017-03-02 NOTE — Discharge Instructions (Signed)
Eye Surgery Discharge Instructions  Expect mild scratchy sensation or mild soreness. DO NOT RUB YOUR EYE!  The day of surgery:  Minimal physical activity, but bed rest is not required  No reading, computer work, or close hand work  No bending, lifting, or straining.  May watch TV  For 24 hours:  No driving, legal decisions, or alcoholic beverages  Safety precautions  Eat anything you prefer: It is better to start with liquids, then soup then solid foods.  _____ Eye patch should be worn until postoperative exam tomorrow.  ____ Solar shield eyeglasses should be worn for comfort in the sunlight/patch while sleeping  Resume all regular medications including aspirin or Coumadin if these were discontinued prior to surgery. You may shower, bathe, shave, or wash your hair. Tylenol may be taken for mild discomfort.  Call your doctor if you experience significant pain, nausea, or vomiting, fever > 101 or other signs of infection. [phone] or [phone] Specific instructions:  Follow-up Information    Eulogio Bear, MD Follow up.   Specialty:  Ophthalmology Why:  September 21 at 9:45am in [facility] office Contact information: [address] [place] [address] [phone]

## 2017-03-02 NOTE — Transfer of Care (Signed)
Immediate Anesthesia Transfer of Care Note  Patient: Cassidy Martinez  Procedure(s) Performed: Procedure(s) with comments: CATARACT EXTRACTION PHACO AND INTRAOCULAR LENS PLACEMENT (IOC) (Left) - Lot # [id number] H US:01:02.5 AP%: 13.6 CDE: 8.51  Patient Location: Short Stay  Anesthesia Type:MAC  Level of Consciousness: awake, alert  and oriented  Airway & Oxygen Therapy: Patient Spontanous Breathing  Post-op Assessment: Post -op Vital signs reviewed and stable  Post vital signs: stable  Last Vitals:  Vitals:   03/02/17 0835 03/02/17 0837  BP: (!) 155/53 (!) 155/53  Pulse: (!) 53   Resp: 16 12  Temp: (!) 36.3 C (!) 36.3 C  SpO2: 100% 100%    Last Pain:  Vitals:   03/02/17 0837  TempSrc: Temporal         Complications: No apparent anesthesia complications

## 2017-03-02 NOTE — Addendum Note (Signed)
Addendum  created 03/02/17 0857 by Alvin Critchley, MD   Sign clinical note

## 2017-03-02 NOTE — H&P (Signed)
The History and Physical notes are on paper, have been signed, and are to be scanned.   I have examined the patient and there are no changes to the H&P.   Benay Pillow 03/02/2017 7:58 AM

## 2017-03-02 NOTE — Op Note (Signed)
OPERATIVE NOTE  Cassidy Martinez [id number] 03/02/2017   PREOPERATIVE DIAGNOSIS:  Nuclear sclerotic cataract left eye.  H25.12   POSTOPERATIVE DIAGNOSIS:    Nuclear sclerotic cataract left eye.     PROCEDURE:  Phacoemusification with posterior chamber intraocular lens placement of the left eye   LENS:   Implant Name Type Inv. Item Serial No. Manufacturer Lot No. LRB No. Used  LENS IOL DIOP 20.5 - [id number] Intraocular Lens LENS IOL DIOP 20.5 380 [id number] AMO   Left 1       PCB00 +20.5   ULTRASOUND TIME: 1 minutes 02 seconds.  CDE 8.51   SURGEON:  Benay Pillow, MD, MPH   ANESTHESIA:  Topical with tetracaine drops augmented with 1% preservative-free intracameral lidocaine.  ESTIMATED BLOOD LOSS: <1 mL   COMPLICATIONS:  None.   DESCRIPTION OF PROCEDURE:  The patient was identified in the holding room and transported to the operating room and placed in the supine position under the operating microscope.  The left eye was identified as the operative eye and it was prepped and draped in the usual sterile ophthalmic fashion.   A 1.0 millimeter clear-corneal paracentesis was made at the 5:00 position. 0.5 ml of preservative-free 1% lidocaine with epinephrine was injected into the anterior chamber.  The anterior chamber was filled with Discovisc viscoelastic.  A 2.4 millimeter keratome was used to make a near-clear corneal incision at the 2:00 position.  A curvilinear capsulorrhexis was made with a cystotome and capsulorrhexis forceps.  Balanced salt solution was used to hydrodissect and hydrodelineate the nucleus.   Phacoemulsification was then used in stop and chop fashion to remove the lens nucleus and epinucleus.  The remaining cortex was then removed using the irrigation and aspiration handpiece. Discovisc was then placed into the capsular bag to distend it for lens placement.  A lens was then injected into the capsular bag.  The remaining viscoelastic was aspirated.   Wounds were  hydrated with balanced salt solution.  The anterior chamber was inflated to a physiologic pressure with balanced salt solution.  Intracameral vigamox 0.1 mL undiltued was injected into the eye and a drop placed onto the ocular surface.  No wound leaks were noted.  The patient was taken to the recovery room in stable condition without complications of anesthesia or surgery  Benay Pillow 03/02/2017, 8:35 AM

## 2017-03-02 NOTE — Anesthesia Post-op Follow-up Note (Signed)
Anesthesia QCDR form completed.        

## 2017-03-08 ENCOUNTER — Other Ambulatory Visit (INDEPENDENT_AMBULATORY_CARE_PROVIDER_SITE_OTHER): Payer: Medicare Other

## 2017-03-08 DIAGNOSIS — E7800 Pure hypercholesterolemia, unspecified: Secondary | ICD-10-CM | POA: Diagnosis not present

## 2017-03-08 LAB — BASIC METABOLIC PANEL
BUN: 20 mg/dL (ref 6–23)
CALCIUM: 9.6 mg/dL (ref 8.4–10.5)
CO2: 28 meq/L (ref 19–32)
Chloride: 104 mEq/L (ref 96–112)
Creatinine, Ser: 1.01 mg/dL (ref 0.40–1.20)
GFR: 55.6 mL/min — AB (ref >60.00)
GLUCOSE: 82 mg/dL (ref 70–99)
Potassium: 4.1 mEq/L (ref 3.5–5.1)
Sodium: 139 mEq/L (ref 135–145)

## 2017-03-08 LAB — LIPID PANEL
CHOLESTEROL: 288 mg/dL — AB (ref 0–200)
HDL: 94.1 mg/dL (ref >39.00)
LDL CALC: 178 mg/dL — AB (ref 0–99)
NonHDL: 194.37
TRIGLYCERIDES: 83 mg/dL (ref 0.0–149.0)
Total CHOL/HDL Ratio: 3
VLDL: 16.6 mg/dL (ref 0.0–40.0)

## 2017-03-08 LAB — HEPATIC FUNCTION PANEL
ALBUMIN: 4.2 g/dL (ref 3.5–5.2)
ALT: 11 U/L (ref 0–35)
AST: 32 U/L (ref 0–37)
Alkaline Phosphatase: 61 U/L (ref 39–117)
BILIRUBIN TOTAL: 1.1 mg/dL (ref 0.2–1.2)
Bilirubin, Direct: 0.1 mg/dL (ref 0.0–0.3)
Total Protein: 6.5 g/dL (ref 6.0–8.3)

## 2017-03-20 DIAGNOSIS — H353221 Exudative age-related macular degeneration, left eye, with active choroidal neovascularization: Secondary | ICD-10-CM | POA: Diagnosis not present

## 2017-03-22 ENCOUNTER — Telehealth: Payer: Self-pay | Admitting: Internal Medicine

## 2017-03-22 ENCOUNTER — Telehealth: Payer: Self-pay | Admitting: *Deleted

## 2017-03-22 DIAGNOSIS — M722 Plantar fascial fibromatosis: Secondary | ICD-10-CM | POA: Diagnosis not present

## 2017-03-22 DIAGNOSIS — R0989 Other specified symptoms and signs involving the circulatory and respiratory systems: Secondary | ICD-10-CM | POA: Diagnosis not present

## 2017-03-22 DIAGNOSIS — R531 Weakness: Secondary | ICD-10-CM | POA: Diagnosis not present

## 2017-03-22 DIAGNOSIS — B9689 Other specified bacterial agents as the cause of diseases classified elsewhere: Secondary | ICD-10-CM | POA: Diagnosis not present

## 2017-03-22 DIAGNOSIS — H5789 Other specified disorders of eye and adnexa: Secondary | ICD-10-CM | POA: Diagnosis not present

## 2017-03-22 DIAGNOSIS — J019 Acute sinusitis, unspecified: Secondary | ICD-10-CM | POA: Diagnosis not present

## 2017-03-22 NOTE — Telephone Encounter (Signed)
Patient Name: Cassidy Martinez  DOB: 05-19-1932    Initial Comment Caller states his wife is so weak she can't stand up, feels nausea.   Nurse Assessment  Nurse: Raphael Gibney, RN, Vanita Ingles Date/Time (Eastern Time): 03/22/2017 9:09:50 AM  Confirm and document reason for call. If symptomatic, describe symptoms. ---Caller states spouse is eating. No vomiting. She feels nauseated. She is so weak she can hardly stand up. No pain, no cough or congestion.  Does the patient have any new or worsening symptoms? ---Yes  Will a triage be completed? ---Yes  Related visit to physician within the last 2 weeks? ---No  Does the PT have any chronic conditions? (i.e. diabetes, asthma, etc.) ---No  Is this a behavioral health or substance abuse call? ---No     Guidelines    Guideline Title Affirmed Question Affirmed Notes  Weakness (Generalized) and Fatigue [1] SEVERE weakness (i.e., unable to walk or barely able to walk, requires support) AND [2] new onset or worsening    Final Disposition User   Call EMS 911 Now Raphael Gibney, RN, Vanita Ingles    Comments  spouse states he does not want to call 911 or take pt to the ER. He wants to make appt.  Called back line and spoke to [place] and gave report that pt is nauseated but no vomiting No pain. no fever. She has been eating and drinking. She is so weak, she can hardly stand up. Triage outcome call 911 but pt does not want to call 911 or go to the ER but wants to make appt.   Caller Disagree/Comply Disagree  Caller Understands Yes  PreDisposition Call Doctor

## 2017-03-22 NOTE — Telephone Encounter (Signed)
Patients husband stated he would take her to be evaluated.

## 2017-03-22 NOTE — Telephone Encounter (Signed)
FYI

## 2017-03-22 NOTE — Telephone Encounter (Signed)
If weak and barely able to stand or walk, needs evaluation now.  Agree with ER evaluation.

## 2017-03-22 NOTE — Telephone Encounter (Signed)
Vera from the nurse line called and stated that she advised pt to call 911 or go to the hospital but refused. They want to come into the office. Please advise, thank you!

## 2017-03-22 NOTE — Telephone Encounter (Signed)
Patient has externe fatigue ,she's having trouble walking. Pt is feeling nausea ,with no vomiting  *transferred to team health  Husband made call  Pt contact [phone]

## 2017-03-22 NOTE — Telephone Encounter (Signed)
She woke up this morning very weak, nauseous, just doesn't feel good. Patient is not running a fever or vomiting but her husband says she is so weak she can barely stand up or walk. She has been eating and drinking plenty of fluids. Team health advised husband to call 911 or take patient to the ED and he declined. I advised that she needed to be taken to urgent care, he asked that I speak with you first.

## 2017-03-22 NOTE — Telephone Encounter (Signed)
See note.  Needs evaluation now.  Agree with ER evaluation.

## 2017-03-29 DIAGNOSIS — E538 Deficiency of other specified B group vitamins: Secondary | ICD-10-CM | POA: Diagnosis not present

## 2017-03-29 DIAGNOSIS — F0280 Dementia in other diseases classified elsewhere without behavioral disturbance: Secondary | ICD-10-CM | POA: Diagnosis not present

## 2017-03-29 DIAGNOSIS — G309 Alzheimer's disease, unspecified: Secondary | ICD-10-CM | POA: Diagnosis not present

## 2017-05-08 ENCOUNTER — Ambulatory Visit (INDEPENDENT_AMBULATORY_CARE_PROVIDER_SITE_OTHER): Payer: Medicare Other | Admitting: Internal Medicine

## 2017-05-08 ENCOUNTER — Encounter: Payer: Self-pay | Admitting: Internal Medicine

## 2017-05-08 DIAGNOSIS — H353290 Exudative age-related macular degeneration, unspecified eye, stage unspecified: Secondary | ICD-10-CM

## 2017-05-08 DIAGNOSIS — E7800 Pure hypercholesterolemia, unspecified: Secondary | ICD-10-CM | POA: Diagnosis not present

## 2017-05-08 DIAGNOSIS — R413 Other amnesia: Secondary | ICD-10-CM

## 2017-05-08 DIAGNOSIS — M810 Age-related osteoporosis without current pathological fracture: Secondary | ICD-10-CM

## 2017-05-08 DIAGNOSIS — E538 Deficiency of other specified B group vitamins: Secondary | ICD-10-CM

## 2017-05-08 DIAGNOSIS — C50412 Malignant neoplasm of upper-outer quadrant of left female breast: Secondary | ICD-10-CM | POA: Diagnosis not present

## 2017-05-08 DIAGNOSIS — R945 Abnormal results of liver function studies: Secondary | ICD-10-CM

## 2017-05-08 DIAGNOSIS — K7689 Other specified diseases of liver: Secondary | ICD-10-CM | POA: Diagnosis not present

## 2017-05-08 NOTE — Progress Notes (Addendum)
Patient ID: Cassidy Martinez, female   DOB: 1931-09-22, 83 y.o.   MRN: [id number]   Subjective:    Patient ID: Cassidy Martinez, female    DOB: 11/10/1931, 83 y.o.   MRN: [id number]  HPI  Patient here for a scheduled follow up.  She is accompanied by her son.  History obtained from both of them.  She feels she is doing relatively well.  Stays active.  No chest pain.  No sob.  Reports no acid reflux.  No abdominal pain.  Bowels moving.  States is eating.  Weight stable.  Still with memory issues.  Son reports he feels is stable.  Saw neurology.  Did not tolerate aricept.  Discussed other medications.  He declines.  States as long as memory stable, he desires no further medication.     Past Medical History:  Diagnosis Date  . Arthritis    hands  . Breast CA ([place])    left;; S/P mastectomy 08/12/11  . Carpal tunnel syndrome, bilateral   . Heart murmur   . History of UTI    "have had a few over the years; haven't had once since ~ 2007"  . HOH (hard of hearing)   . Hyperlipidemia   . Migraines    "used to have them severe; stopped when I took self off estrogen"  . Skin cancer    Past Surgical History:  Procedure Laterality Date  . ABDOMINAL HYSTERECTOMY  ~ 1983  . APPENDECTOMY    . BREAST LUMPECTOMY  03/2009   "abnormal cells"; right  . CARPAL TUNNEL RELEASE     right; "I've had it operated on twice"  . CATARACT EXTRACTION W/PHACO Left 03/02/2017   Procedure: CATARACT EXTRACTION PHACO AND INTRAOCULAR LENS PLACEMENT (IOC);  Surgeon: Eulogio Bear, MD;  Location: [facility];  Service: Ophthalmology;  Laterality: Left;  Lot # [id number] H US:01:02.5 AP%: 13.6 CDE: 8.51  . EYE SURGERY  2006   repair of tear in retina  . MASTECTOMY COMPLETE / SIMPLE W/ SENTINEL NODE BIOPSY  08/12/11   axillary SNB; left  . MASTECTOMY W/ SENTINEL NODE BIOPSY  08/12/2011   Procedure: MASTECTOMY WITH SENTINEL LYMPH NODE BIOPSY;  Surgeon: Adin Hector, MD;  Location: [place];  Service: General;  Laterality: Left;  .  NASAL SINUS SURGERY    . SCALP MASS REMOVAL  2007   "knot; benign"   Family History  Problem Relation Age of Onset  . Stroke Mother   . Cancer Mother        ovarian  . Arthritis Father   . Cancer Maternal Aunt        breast  . Cancer Paternal Aunt        breast  . Dementia Brother   . Anesthesia problems Neg Hx    Social History   Socioeconomic History  . Marital status: Married    Spouse name: None  . Number of children: 0  . Years of education: None  . Highest education level: None  Social Needs  . Financial resource strain: None  . Food insecurity - worry: None  . Food insecurity - inability: None  . Transportation needs - medical: None  . Transportation needs - non-medical: None  Occupational History  . None  Tobacco Use  . Smoking status: Never Smoker  . Smokeless tobacco: Never Used  Substance and Sexual Activity  . Alcohol use: No    Alcohol/week: 0.0 oz  . Drug use: No  . Sexual activity: Yes  Other Topics Concern  . None  Social History Narrative  . None    Outpatient Encounter Medications as of 05/08/2017  Medication Sig  . Multiple Vitamins-Minerals (PRESERVISION AREDS 2 PO) Take by mouth.  . rosuvastatin (CRESTOR) 5 MG tablet Take 1 tablet (5 mg total) by mouth daily.   No facility-administered encounter medications on file as of 05/08/2017.     Review of Systems  Constitutional: Negative for appetite change and unexpected weight change.  HENT: Negative for congestion and sinus pressure.   Respiratory: Negative for cough, chest tightness and shortness of breath.   Cardiovascular: Negative for chest pain, palpitations and leg swelling.  Gastrointestinal: Negative for abdominal pain, diarrhea and nausea.  Genitourinary: Negative for difficulty urinating and dysuria.  Musculoskeletal: Negative for joint swelling and myalgias.  Skin: Negative for color change and rash.  Neurological: Negative for dizziness, light-headedness and headaches.    Psychiatric/Behavioral: Negative for agitation and dysphoric mood.       Objective:    Physical Exam  Constitutional: She appears well-developed and well-nourished. No distress.  HENT:  Nose: Nose normal.  Mouth/Throat: Oropharynx is clear and moist.  Neck: Neck supple. No thyromegaly present.  Cardiovascular: Normal rate and regular rhythm.  Pulmonary/Chest: Breath sounds normal. No respiratory distress. She has no wheezes.  Abdominal: Soft. Bowel sounds are normal. There is no tenderness.  Musculoskeletal: She exhibits no edema or tenderness.  Lymphadenopathy:    She has no cervical adenopathy.  Skin: No rash noted. No erythema.  Psychiatric: She has a normal mood and affect. Her behavior is normal.    BP 118/68 (BP Location: Left Arm, Patient Position: Sitting, Cuff Size: Normal)   Pulse 72   Temp (!) 97.5 F (36.4 C) (Oral)   Ht 5\' 4"  (1.626 m)   Wt 100 lb (45.4 kg)   SpO2 100%   BMI 17.16 kg/m  Wt Readings from Last 3 Encounters:  05/08/17 100 lb (45.4 kg)  03/02/17 98 lb (44.5 kg)  02/27/17 98 lb 12.8 oz (44.8 kg)     Lab Results  Component Value Date   WBC 6.1 10/03/2016   HGB 14.2 10/03/2016   HCT 43.1 10/03/2016   PLT 193.0 10/03/2016   GLUCOSE 82 03/08/2017   CHOL 288 (H) 03/08/2017   TRIG 83.0 03/08/2017   HDL 94.10 03/08/2017   LDLDIRECT 174.2 07/09/2013   LDLCALC 178 (H) 03/08/2017   ALT 11 03/08/2017   AST 32 03/08/2017   NA 139 03/08/2017   K 4.1 03/08/2017   CL 104 03/08/2017   CREATININE 1.01 03/08/2017   BUN 20 03/08/2017   CO2 28 03/08/2017   TSH 1.91 10/03/2016       Assessment & Plan:   Problem List Items Addressed This Visit    Abnormal liver function    Follow liver function tests.        Relevant Orders   Hepatic function panel   AMD (age-related macular degeneration), wet ([place])    Followed by ophthalmology.        B12 deficiency    Was receiving injections.  Start oral b12.        Relevant Orders   Vitamin B12    Hypercholesterolemia    On crestor now.  Follow lipid panel and liver function tests.  She is taking now.        Relevant Orders   Lipid panel   Basic metabolic panel   Memory change    Saw neurology.  Note reviewed.  Felt to be probable late onset alzheimers disease.  Started on aricept.  Did not tolerate.  Discussed other treatment options.  Declines.  Son states as long as stable, desires no other medication.  Follow.  MRI - unrevealing.          Osteoporosis    Have discussed treatment options.  Was supposed to be on fosamax.  Not taking now.  Dietary calcium and weight bearing exercise.  Follow.        Primary cancer of upper outer quadrant of left female breast ([place])    Followed by oncology.           Einar Pheasant, MD

## 2017-05-08 NOTE — Patient Instructions (Signed)
Oral Vitamin B12 1066mcg per day

## 2017-05-10 ENCOUNTER — Telehealth: Payer: Self-pay | Admitting: Internal Medicine

## 2017-05-10 NOTE — Telephone Encounter (Signed)
Patient states they have found medications

## 2017-05-10 NOTE — Telephone Encounter (Unsigned)
Copied from [place] [id number]. Topic: Quick Communication - Rx Refill/Question >> May 10, 2017 11:03 AM Malena Catholic I, NT wrote: Has the patient contacted their pharmacy? {yes OF:[id number]}  (Agent: If no, request that the patient contact the pharmacy for the refill.)  Preferred Pharmacy (with phone number or street name): ***  Agent: Please be advised that RX refills may take up to 48 hours. We ask that you follow-up with your pharmacy.

## 2017-05-11 ENCOUNTER — Encounter: Payer: Self-pay | Admitting: Internal Medicine

## 2017-05-11 DIAGNOSIS — E538 Deficiency of other specified B group vitamins: Secondary | ICD-10-CM | POA: Insufficient documentation

## 2017-05-11 NOTE — Assessment & Plan Note (Signed)
Followed by oncology 

## 2017-05-11 NOTE — Assessment & Plan Note (Signed)
Saw neurology.  Note reviewed.  Felt to be probable late onset alzheimers disease.  Started on aricept.  Did not tolerate.  Discussed other treatment options.  Declines.  Son states as long as stable, desires no other medication.  Follow.  MRI - unrevealing.

## 2017-05-11 NOTE — Addendum Note (Signed)
Addended by: Alisa Graff on: 05/11/2017 04:37 AM   Modules accepted: Orders

## 2017-05-11 NOTE — Assessment & Plan Note (Signed)
Follow liver function tests.   

## 2017-05-11 NOTE — Assessment & Plan Note (Signed)
Have discussed treatment options.  Was supposed to be on fosamax.  Not taking now.  Dietary calcium and weight bearing exercise.  Follow.

## 2017-05-11 NOTE — Assessment & Plan Note (Signed)
Was receiving injections.  Start oral b12.

## 2017-05-11 NOTE — Assessment & Plan Note (Signed)
On crestor now.  Follow lipid panel and liver function tests.  She is taking now.

## 2017-05-11 NOTE — Assessment & Plan Note (Signed)
Followed by ophthalmology.   

## 2017-06-05 ENCOUNTER — Other Ambulatory Visit: Payer: Self-pay | Admitting: Internal Medicine

## 2017-07-13 ENCOUNTER — Other Ambulatory Visit (INDEPENDENT_AMBULATORY_CARE_PROVIDER_SITE_OTHER): Payer: Medicare Other

## 2017-07-13 DIAGNOSIS — K7689 Other specified diseases of liver: Secondary | ICD-10-CM

## 2017-07-13 DIAGNOSIS — E7800 Pure hypercholesterolemia, unspecified: Secondary | ICD-10-CM | POA: Diagnosis not present

## 2017-07-13 DIAGNOSIS — R945 Abnormal results of liver function studies: Secondary | ICD-10-CM

## 2017-07-13 DIAGNOSIS — E538 Deficiency of other specified B group vitamins: Secondary | ICD-10-CM

## 2017-07-13 LAB — VITAMIN B12: Vitamin B-12: 805 pg/mL (ref 211–911)

## 2017-07-13 LAB — BASIC METABOLIC PANEL
BUN: 26 mg/dL — AB (ref 6–23)
CALCIUM: 9.2 mg/dL (ref 8.4–10.5)
CO2: 29 mEq/L (ref 19–32)
CREATININE: 1.02 mg/dL (ref 0.40–1.20)
Chloride: 107 mEq/L (ref 96–112)
GFR: 54.92 mL/min — ABNORMAL LOW (ref >60.00)
GLUCOSE: 89 mg/dL (ref 70–99)
Potassium: 4 mEq/L (ref 3.5–5.1)
SODIUM: 140 meq/L (ref 135–145)

## 2017-07-13 LAB — LIPID PANEL
Cholesterol: 230 mg/dL — ABNORMAL HIGH (ref 0–200)
HDL: 86.2 mg/dL (ref >39.00)
LDL Cholesterol: 122 mg/dL — ABNORMAL HIGH (ref 0–99)
NonHDL: 143.33
Total CHOL/HDL Ratio: 3
Triglycerides: 107 mg/dL (ref 0.0–149.0)
VLDL: 21.4 mg/dL (ref 0.0–40.0)

## 2017-07-13 LAB — HEPATIC FUNCTION PANEL
ALBUMIN: 3.9 g/dL (ref 3.5–5.2)
ALK PHOS: 66 U/L (ref 39–117)
ALT: 13 U/L (ref 0–35)
AST: 32 U/L (ref 0–37)
Bilirubin, Direct: 0.1 mg/dL (ref 0.0–0.3)
Total Bilirubin: 0.7 mg/dL (ref 0.2–1.2)
Total Protein: 6.6 g/dL (ref 6.0–8.3)

## 2017-07-14 ENCOUNTER — Ambulatory Visit (INDEPENDENT_AMBULATORY_CARE_PROVIDER_SITE_OTHER): Payer: Medicare Other | Admitting: Internal Medicine

## 2017-07-14 VITALS — BP 128/66 | HR 62 | Temp 97.5°F | Resp 16 | Wt 103.8 lb

## 2017-07-14 DIAGNOSIS — E538 Deficiency of other specified B group vitamins: Secondary | ICD-10-CM | POA: Diagnosis not present

## 2017-07-14 DIAGNOSIS — C50412 Malignant neoplasm of upper-outer quadrant of left female breast: Secondary | ICD-10-CM | POA: Diagnosis not present

## 2017-07-14 DIAGNOSIS — K7689 Other specified diseases of liver: Secondary | ICD-10-CM

## 2017-07-14 DIAGNOSIS — Z1231 Encounter for screening mammogram for malignant neoplasm of breast: Secondary | ICD-10-CM

## 2017-07-14 DIAGNOSIS — E7800 Pure hypercholesterolemia, unspecified: Secondary | ICD-10-CM | POA: Diagnosis not present

## 2017-07-14 DIAGNOSIS — H353290 Exudative age-related macular degeneration, unspecified eye, stage unspecified: Secondary | ICD-10-CM

## 2017-07-14 DIAGNOSIS — Z1239 Encounter for other screening for malignant neoplasm of breast: Secondary | ICD-10-CM

## 2017-07-14 DIAGNOSIS — Z853 Personal history of malignant neoplasm of breast: Secondary | ICD-10-CM | POA: Diagnosis not present

## 2017-07-14 DIAGNOSIS — R945 Abnormal results of liver function studies: Secondary | ICD-10-CM

## 2017-07-14 NOTE — Progress Notes (Signed)
Patient ID: Cassidy Martinez, female   DOB: June 19, 1931, 84 y.o.   MRN: [id number]   Subjective:    Patient ID: Cassidy Martinez, female    DOB: 07-15-1931, 84 y.o.   MRN: [id number]  HPI  Patient here for a scheduled follow up.  She is accompanied by her son.  History obtained from both of them.  She is doing well.  Appears to be doing better.  Son bring in cooked food.  Weight is up a few pounds.  Eating well.  Good appetite.  No chest pain.  No sob. No acid reflux. No abdominal pain.  Bowels moving.  No urine change. Memory stable.  See previous note.  On no medication.  Overall appears to be doing better.     Past Medical History:  Diagnosis Date  . Arthritis    hands  . Breast CA ([place])    left;; S/P mastectomy 08/12/11  . Carpal tunnel syndrome, bilateral   . Heart murmur   . History of UTI    "have had a few over the years; haven't had once since ~ 2007"  . HOH (hard of hearing)   . Hyperlipidemia   . Migraines    "used to have them severe; stopped when I took self off estrogen"  . Skin cancer    Past Surgical History:  Procedure Laterality Date  . ABDOMINAL HYSTERECTOMY  ~ 1983  . APPENDECTOMY    . BREAST LUMPECTOMY  03/2009   "abnormal cells"; right  . CARPAL TUNNEL RELEASE     right; "I've had it operated on twice"  . CATARACT EXTRACTION W/PHACO Left 03/02/2017   Procedure: CATARACT EXTRACTION PHACO AND INTRAOCULAR LENS PLACEMENT (IOC);  Surgeon: Eulogio Bear, MD;  Location: [facility];  Service: Ophthalmology;  Laterality: Left;  Lot # [id number] H US:01:02.5 AP%: 13.6 CDE: 8.51  . EYE SURGERY  2006   repair of tear in retina  . MASTECTOMY COMPLETE / SIMPLE W/ SENTINEL NODE BIOPSY  08/12/11   axillary SNB; left  . MASTECTOMY W/ SENTINEL NODE BIOPSY  08/12/2011   Procedure: MASTECTOMY WITH SENTINEL LYMPH NODE BIOPSY;  Surgeon: Adin Hector, MD;  Location: [place];  Service: General;  Laterality: Left;  . NASAL SINUS SURGERY    . SCALP MASS REMOVAL  2007   "knot; benign"    Family History  Problem Relation Age of Onset  . Stroke Mother   . Cancer Mother        ovarian  . Arthritis Father   . Cancer Maternal Aunt        breast  . Cancer Paternal Aunt        breast  . Dementia Brother   . Anesthesia problems Neg Hx    Social History   Socioeconomic History  . Marital status: Married    Spouse name: None  . Number of children: 0  . Years of education: None  . Highest education level: None  Social Needs  . Financial resource strain: None  . Food insecurity - worry: None  . Food insecurity - inability: None  . Transportation needs - medical: None  . Transportation needs - non-medical: None  Occupational History  . None  Tobacco Use  . Smoking status: Never Smoker  . Smokeless tobacco: Never Used  Substance and Sexual Activity  . Alcohol use: No    Alcohol/week: 0.0 oz  . Drug use: No  . Sexual activity: Yes  Other Topics Concern  . None  Social  History Narrative  . None    Outpatient Encounter Medications as of 07/14/2017  Medication Sig  . Multiple Vitamins-Minerals (PRESERVISION AREDS 2 PO) Take by mouth.  . rosuvastatin (CRESTOR) 5 MG tablet TAKE ONE TABLET BY MOUTH EVERY DAY   No facility-administered encounter medications on file as of 07/14/2017.     Review of Systems  Constitutional: Negative for appetite change and unexpected weight change.  HENT: Negative for congestion and sinus pressure.   Respiratory: Negative for cough, chest tightness and shortness of breath.   Cardiovascular: Negative for chest pain, palpitations and leg swelling.  Gastrointestinal: Negative for abdominal pain, diarrhea, nausea and vomiting.  Genitourinary: Negative for difficulty urinating and dysuria.  Musculoskeletal: Negative for joint swelling and myalgias.  Skin: Negative for color change and rash.  Neurological: Negative for dizziness, light-headedness and headaches.  Psychiatric/Behavioral: Negative for agitation and dysphoric mood.        Objective:     Blood pressure rechecked by me:  130/68  Physical Exam  Constitutional: She appears well-developed and well-nourished. No distress.  HENT:  Nose: Nose normal.  Mouth/Throat: Oropharynx is clear and moist.  Neck: Neck supple. No thyromegaly present.  Cardiovascular: Normal rate and regular rhythm.  Pulmonary/Chest: Breath sounds normal. No respiratory distress. She has no wheezes.  Abdominal: Soft. Bowel sounds are normal. There is no tenderness.  Musculoskeletal: She exhibits no edema or tenderness.  Lymphadenopathy:    She has no cervical adenopathy.  Skin: No rash noted. No erythema.  Psychiatric: She has a normal mood and affect. Her behavior is normal.    BP 128/66 (BP Location: Left Arm, Patient Position: Sitting, Cuff Size: Normal)   Pulse 62   Temp (!) 97.5 F (36.4 C) (Oral)   Resp 16   Wt 103 lb 12.8 oz (47.1 kg)   SpO2 98%   BMI 17.82 kg/m  Wt Readings from Last 3 Encounters:  07/14/17 103 lb 12.8 oz (47.1 kg)  05/08/17 100 lb (45.4 kg)  03/02/17 98 lb (44.5 kg)     Lab Results  Component Value Date   WBC 6.1 10/03/2016   HGB 14.2 10/03/2016   HCT 43.1 10/03/2016   PLT 193.0 10/03/2016   GLUCOSE 89 07/13/2017   CHOL 230 (H) 07/13/2017   TRIG 107.0 07/13/2017   HDL 86.20 07/13/2017   LDLDIRECT 174.2 07/09/2013   LDLCALC 122 (H) 07/13/2017   ALT 13 07/13/2017   AST 32 07/13/2017   NA 140 07/13/2017   K 4.0 07/13/2017   CL 107 07/13/2017   CREATININE 1.02 07/13/2017   BUN 26 (H) 07/13/2017   CO2 29 07/13/2017   TSH 1.91 10/03/2016       Assessment & Plan:   Problem List Items Addressed This Visit    Abnormal liver function    Most recent check wnl.  Follow.       Relevant Orders   Hepatic function panel   AMD (age-related macular degeneration), wet ([place])    Followed by ophthalmology.       B12 deficiency    On oral B12 recent B12 level improved.  Follow.        Hypercholesterolemia    On crestor.  Cholesterol  improved.  Follow lipid panel and liver function tests.        Relevant Orders   Lipid panel   TSH   CBC with Differential/Platelet   Basic metabolic panel   Primary cancer of upper outer quadrant of left female breast ([place])  Followed by oncology.        Other Visit Diagnoses    Breast cancer screening    -  Primary   Relevant Orders   MM SCREENING BREAST TOMO UNI R   History of breast cancer       Relevant Orders   MM SCREENING BREAST TOMO UNI Loyce Dys, MD

## 2017-07-16 ENCOUNTER — Encounter: Payer: Self-pay | Admitting: Internal Medicine

## 2017-07-16 NOTE — Assessment & Plan Note (Signed)
Followed by ophthalmology.   

## 2017-07-16 NOTE — Assessment & Plan Note (Signed)
On crestor.  Cholesterol improved.  Follow lipid panel and liver function tests.

## 2017-07-16 NOTE — Assessment & Plan Note (Signed)
Most recent check wnl.  Follow.    

## 2017-07-16 NOTE — Assessment & Plan Note (Signed)
On oral B12 recent B12 level improved.  Follow.

## 2017-07-16 NOTE — Assessment & Plan Note (Signed)
Followed by oncology 

## 2017-08-14 DIAGNOSIS — H4312 Vitreous hemorrhage, left eye: Secondary | ICD-10-CM | POA: Diagnosis not present

## 2017-08-15 DIAGNOSIS — H4312 Vitreous hemorrhage, left eye: Secondary | ICD-10-CM | POA: Diagnosis not present

## 2017-08-28 ENCOUNTER — Ambulatory Visit: Payer: Medicare Other

## 2017-08-30 DIAGNOSIS — H2102 Hyphema, left eye: Secondary | ICD-10-CM | POA: Diagnosis not present

## 2017-08-30 DIAGNOSIS — H3322 Serous retinal detachment, left eye: Secondary | ICD-10-CM | POA: Diagnosis not present

## 2017-08-30 DIAGNOSIS — H4312 Vitreous hemorrhage, left eye: Secondary | ICD-10-CM | POA: Diagnosis not present

## 2017-08-30 DIAGNOSIS — H33022 Retinal detachment with multiple breaks, left eye: Secondary | ICD-10-CM | POA: Diagnosis not present

## 2017-08-30 DIAGNOSIS — H5789 Other specified disorders of eye and adnexa: Secondary | ICD-10-CM | POA: Diagnosis not present

## 2017-08-31 ENCOUNTER — Telehealth: Payer: Self-pay

## 2017-08-31 DIAGNOSIS — H338 Other retinal detachments: Secondary | ICD-10-CM | POA: Diagnosis not present

## 2017-08-31 NOTE — Telephone Encounter (Signed)
Ok to cancel mammogram

## 2017-08-31 NOTE — Telephone Encounter (Signed)
If she needs to postpone the mammogram secondary to her recent surgery - ok.

## 2017-08-31 NOTE — Telephone Encounter (Signed)
Copied from [place] [id number]. Topic: Inquiry >> Aug 31, 2017  3:49 PM Scherrie Gerlach wrote: Reason for CRM: son is calling to ask if ok for pt to put of her mammogram that is scheduled 09/05/17? Pt had surgery for a detached retina and is not doing well.  Pt has stitches in her eye, had a bad reaction to the medicine, just not doing well.

## 2017-09-01 NOTE — Telephone Encounter (Signed)
Left message for son to call back 

## 2017-09-04 NOTE — Telephone Encounter (Signed)
Family aware that it is ok to cancel mammogram.

## 2017-11-13 ENCOUNTER — Ambulatory Visit: Payer: Medicare Other | Admitting: Internal Medicine

## 2017-11-16 ENCOUNTER — Other Ambulatory Visit (INDEPENDENT_AMBULATORY_CARE_PROVIDER_SITE_OTHER): Payer: Medicare Other

## 2017-11-16 DIAGNOSIS — E7800 Pure hypercholesterolemia, unspecified: Secondary | ICD-10-CM

## 2017-11-16 DIAGNOSIS — R945 Abnormal results of liver function studies: Secondary | ICD-10-CM | POA: Diagnosis not present

## 2017-11-16 LAB — LIPID PANEL
Cholesterol: 219 mg/dL — ABNORMAL HIGH (ref 0–200)
HDL: 81.9 mg/dL (ref >39.00)
LDL Cholesterol: 118 mg/dL — ABNORMAL HIGH (ref 0–99)
NonHDL: 136.89
TRIGLYCERIDES: 94 mg/dL (ref 0.0–149.0)
Total CHOL/HDL Ratio: 3
VLDL: 18.8 mg/dL (ref 0.0–40.0)

## 2017-11-16 LAB — CBC WITH DIFFERENTIAL/PLATELET
Basophils Absolute: 0.1 10*3/uL (ref 0.0–0.1)
Basophils Relative: 1.2 % (ref 0.0–3.0)
Eosinophils Absolute: 0.4 10*3/uL (ref 0.0–0.7)
Eosinophils Relative: 5.7 % — ABNORMAL HIGH (ref 0.0–5.0)
HEMATOCRIT: 39.9 % (ref 36.0–46.0)
Hemoglobin: 13.3 g/dL (ref 12.0–15.0)
LYMPHS ABS: 1.5 10*3/uL (ref 0.7–4.0)
Lymphocytes Relative: 22.6 % (ref 12.0–46.0)
MCHC: 33.3 g/dL (ref 30.0–36.0)
MCV: 88.6 fl (ref 78.0–100.0)
MONOS PCT: 9.1 % (ref 3.0–12.0)
Monocytes Absolute: 0.6 10*3/uL (ref 0.1–1.0)
NEUTROS ABS: 4 10*3/uL (ref 1.4–7.7)
Neutrophils Relative %: 61.4 % (ref 43.0–77.0)
Platelets: 194 10*3/uL (ref 150.0–400.0)
RBC: 4.51 Mil/uL (ref 3.87–5.11)
RDW: 14.7 % (ref 11.5–15.5)
WBC: 6.5 10*3/uL (ref 4.0–10.5)

## 2017-11-16 LAB — BASIC METABOLIC PANEL
BUN: 22 mg/dL (ref 6–23)
CO2: 28 meq/L (ref 19–32)
Calcium: 9.4 mg/dL (ref 8.4–10.5)
Chloride: 105 mEq/L (ref 96–112)
Creatinine, Ser: 1 mg/dL (ref 0.40–1.20)
GFR: 56.14 mL/min — AB (ref >60.00)
GLUCOSE: 82 mg/dL (ref 70–99)
Potassium: 3.7 mEq/L (ref 3.5–5.1)
SODIUM: 140 meq/L (ref 135–145)

## 2017-11-16 LAB — HEPATIC FUNCTION PANEL
ALT: 14 U/L (ref 0–35)
AST: 35 U/L (ref 0–37)
Albumin: 3.9 g/dL (ref 3.5–5.2)
Alkaline Phosphatase: 64 U/L (ref 39–117)
BILIRUBIN DIRECT: 0.1 mg/dL (ref 0.0–0.3)
BILIRUBIN TOTAL: 0.7 mg/dL (ref 0.2–1.2)
TOTAL PROTEIN: 6.3 g/dL (ref 6.0–8.3)

## 2017-11-16 LAB — TSH: TSH: 1.71 u[IU]/mL (ref 0.35–4.50)

## 2017-11-17 ENCOUNTER — Encounter: Payer: Self-pay | Admitting: Internal Medicine

## 2017-11-17 ENCOUNTER — Ambulatory Visit (INDEPENDENT_AMBULATORY_CARE_PROVIDER_SITE_OTHER): Payer: Medicare Other | Admitting: Internal Medicine

## 2017-11-17 ENCOUNTER — Ambulatory Visit (INDEPENDENT_AMBULATORY_CARE_PROVIDER_SITE_OTHER): Payer: Medicare Other

## 2017-11-17 VITALS — BP 126/72 | HR 59 | Temp 98.1°F | Resp 18 | Ht 64.0 in | Wt 100.1 lb

## 2017-11-17 DIAGNOSIS — R413 Other amnesia: Secondary | ICD-10-CM

## 2017-11-17 DIAGNOSIS — Z0000 Encounter for general adult medical examination without abnormal findings: Secondary | ICD-10-CM

## 2017-11-17 DIAGNOSIS — R945 Abnormal results of liver function studies: Secondary | ICD-10-CM | POA: Diagnosis not present

## 2017-11-17 DIAGNOSIS — C50412 Malignant neoplasm of upper-outer quadrant of left female breast: Secondary | ICD-10-CM

## 2017-11-17 DIAGNOSIS — E7800 Pure hypercholesterolemia, unspecified: Secondary | ICD-10-CM

## 2017-11-17 DIAGNOSIS — F439 Reaction to severe stress, unspecified: Secondary | ICD-10-CM

## 2017-11-17 NOTE — Patient Instructions (Addendum)
  Ms. Crayton , Thank you for taking time to come for your Medicare Wellness Visit. I appreciate your ongoing commitment to your health goals. Please review the following plan we discussed and let me know if I can assist you in the future.   These are the goals we discussed: Goals    . Maintain Healthy Lifestyle     Eat a healthy diet Stay active Stay hydrated       This is a list of the screening recommended for you and due dates:  Health Maintenance  Topic Date Due  . DEXA scan (bone density measurement)  07/29/2016  . Mammogram  09/01/2017  . Pneumonia vaccines (2 of 2 - PPSV23) 10/05/2017  . Tetanus Vaccine  07/15/2024*  *Topic was postponed. The date shown is not the original due date.

## 2017-11-17 NOTE — Progress Notes (Signed)
Patient ID: Cassidy Martinez, female   DOB: 04/11/1932, 84 y.o.   MRN: [id number]   Subjective:    Patient ID: Cassidy Martinez, female    DOB: [birth year], 84 y.o.   MRN: [id number]  HPI  Patient here for a scheduled follow up.  She is accompanied by her son and daughter-n-law.  History obtained from all of them.  Overall things are stable.  Memory overall stable.  Stays active.  Gets out and works in her yard.  No chest pain.  No sob.  No acid reflux.  No abdominal pain.  Bowels moving.  Handling stress.  S/p eye surgery.  Still having issues with her eye.  States has improved.  Continue f/u with ophthalmology.  Discussed recent labs.     Past Medical History:  Diagnosis Date  . Arthritis    hands  . Breast CA ([place])    left;; S/P mastectomy 08/12/11  . Carpal tunnel syndrome, bilateral   . Heart murmur   . History of UTI    "have had a few over the years; haven't had once since ~ 2007"  . HOH (hard of hearing)   . Hyperlipidemia   . Migraines    "used to have them severe; stopped when I took self off estrogen"  . Skin cancer    Past Surgical History:  Procedure Laterality Date  . ABDOMINAL HYSTERECTOMY  ~ 1983  . APPENDECTOMY    . BREAST LUMPECTOMY  03/2009   "abnormal cells"; right  . CARPAL TUNNEL RELEASE     right; "I've had it operated on twice"  . CATARACT EXTRACTION W/PHACO Left 03/02/2017   Procedure: CATARACT EXTRACTION PHACO AND INTRAOCULAR LENS PLACEMENT (IOC);  Surgeon: Eulogio Bear, MD;  Location: [facility];  Service: Ophthalmology;  Laterality: Left;  Lot # [id number] H US:01:02.5 AP%: 13.6 CDE: 8.51  . EYE SURGERY  2006   repair of tear in retina  . MASTECTOMY COMPLETE / SIMPLE W/ SENTINEL NODE BIOPSY  08/12/11   axillary SNB; left  . MASTECTOMY W/ SENTINEL NODE BIOPSY  08/12/2011   Procedure: MASTECTOMY WITH SENTINEL LYMPH NODE BIOPSY;  Surgeon: Adin Hector, MD;  Location: [place];  Service: General;  Laterality: Left;  . NASAL SINUS SURGERY    . SCALP MASS REMOVAL   2007   "knot; benign"   Family History  Problem Relation Age of Onset  . Stroke Mother   . Cancer Mother        ovarian  . Arthritis Father   . Cancer Maternal Aunt        breast  . Cancer Paternal Aunt        breast  . Dementia Brother   . Anesthesia problems Neg Hx    Social History   Socioeconomic History  . Marital status: Married    Spouse name: Not on file  . Number of children: 0  . Years of education: Not on file  . Highest education level: Not on file  Occupational History  . Not on file  Social Needs  . Financial resource strain: Not hard at all  . Food insecurity:    Worry: Never true    Inability: Never true  . Transportation needs:    Medical: No    Non-medical: No  Tobacco Use  . Smoking status: Never Smoker  . Smokeless tobacco: Never Used  Substance and Sexual Activity  . Alcohol use: No    Alcohol/week: 0.0 oz  . Drug use: No  .  Sexual activity: Yes  Lifestyle  . Physical activity:    Days per week: Not on file    Minutes per session: Not on file  . Stress: Not at all  Relationships  . Social connections:    Talks on phone: Not on file    Gets together: Not on file    Attends religious service: Not on file    Active member of club or organization: Not on file    Attends meetings of clubs or organizations: Not on file    Relationship status: Not on file  Other Topics Concern  . Not on file  Social History Narrative  . Not on file    Outpatient Encounter Medications as of 11/17/2017  Medication Sig  . Multiple Vitamins-Minerals (PRESERVISION AREDS 2 PO) Take by mouth.  . rosuvastatin (CRESTOR) 5 MG tablet TAKE ONE TABLET BY MOUTH EVERY DAY   No facility-administered encounter medications on file as of 11/17/2017.     Review of Systems  Constitutional: Negative for appetite change and unexpected weight change.  HENT: Negative for congestion and sinus pressure.   Respiratory: Negative for cough, chest tightness and shortness of breath.    Cardiovascular: Negative for chest pain, palpitations and leg swelling.  Gastrointestinal: Negative for abdominal pain, diarrhea, nausea and vomiting.  Genitourinary: Negative for difficulty urinating and dysuria.  Musculoskeletal: Negative for joint swelling and myalgias.  Skin: Negative for color change and rash.  Neurological: Negative for dizziness, light-headedness and headaches.  Psychiatric/Behavioral: Negative for agitation and dysphoric mood.       Objective:    Physical Exam  Constitutional: She appears well-developed and well-nourished. No distress.  HENT:  Nose: Nose normal.  Mouth/Throat: Oropharynx is clear and moist.  Neck: Neck supple. No thyromegaly present.  Cardiovascular: Normal rate and regular rhythm.  Pulmonary/Chest: Breath sounds normal. No respiratory distress. She has no wheezes.  Abdominal: Soft. Bowel sounds are normal. There is no tenderness.  Musculoskeletal: She exhibits no edema or tenderness.  Lymphadenopathy:    She has no cervical adenopathy.  Skin: No rash noted. No erythema.  Psychiatric: She has a normal mood and affect. Her behavior is normal.    BP 126/72 (BP Location: Left Arm, Patient Position: Sitting, Cuff Size: Normal)   Pulse (!) 59   Temp 98.1 F (36.7 C) (Oral)   Resp 18   Wt 100 lb 12.8 oz (45.7 kg)   SpO2 98%   BMI 17.30 kg/m  Wt Readings from Last 3 Encounters:  11/17/17 100 lb 12.8 oz (45.7 kg)  11/17/17 100 lb 1.9 oz (45.4 kg)  07/14/17 103 lb 12.8 oz (47.1 kg)     Lab Results  Component Value Date   WBC 6.5 11/16/2017   HGB 13.3 11/16/2017   HCT 39.9 11/16/2017   PLT 194.0 11/16/2017   GLUCOSE 82 11/16/2017   CHOL 219 (H) 11/16/2017   TRIG 94.0 11/16/2017   HDL 81.90 11/16/2017   LDLDIRECT 174.2 07/09/2013   LDLCALC 118 (H) 11/16/2017   ALT 14 11/16/2017   AST 35 11/16/2017   NA 140 11/16/2017   K 3.7 11/16/2017   CL 105 11/16/2017   CREATININE 1.00 11/16/2017   BUN 22 11/16/2017   CO2 28  11/16/2017   TSH 1.71 11/16/2017       Assessment & Plan:   Problem List Items Addressed This Visit    Abnormal liver function    Recent liver check wnl.       Hypercholesterolemia  On crestor.  Cholesterol improved.  Continue current dose.  Follow lipid panel and liver function tests.        Relevant Orders   Hepatic function panel   Lipid panel   Basic metabolic panel   Memory change    Saw neurology.  Felt to have late onset alzheimers disease.  Was started on aricept.  Did not tolerate.  Declines further treatment options.  Son reports as long as stable, desires no further medication,etc.        Primary cancer of upper outer quadrant of left female breast [facility])    Has been followed by oncology.  Declines f/u mammogram.        Stress    Husband appears to be stable.  Overall handling stress.  Follow.           Einar Pheasant, MD

## 2017-11-17 NOTE — Progress Notes (Addendum)
Subjective:   Cassidy Martinez is a 84 y.o. female who presents for Medicare Annual (Subsequent) preventive examination.  Review of Systems:  No ROS.  Medicare Wellness Visit. Additional risk factors are reflected in the social history. Cardiac Risk Factors include: advanced age (>30men, >65 women)     Objective:     Vitals: BP 126/72 (BP Location: Left Arm, Patient Position: Sitting, Cuff Size: Normal)   Pulse (!) 59   Temp 98.1 F (36.7 C) (Oral)   Resp 18   Ht 5\' 4"  (1.626 m)   Wt 100 lb 1.9 oz (45.4 kg)   SpO2 98%   BMI 17.19 kg/m   Body mass index is 17.19 kg/m.  Advanced Directives 11/17/2017 03/02/2017 08/26/2016 08/11/2016 08/24/2015 08/11/2015 08/06/2014  Does Patient Have a Medical Advance Directive? Yes No Yes Yes Yes No Yes  Type of Paramedic of [place];Living will - [place];Living will - [place];Living will - [place];Living will  Does patient want to make changes to medical advance directive? No - Patient declined - No - Patient declined - No - Patient declined - -  Copy of [place] in Chart? Yes - Yes - No - copy requested - No - copy requested  Would patient like information on creating a medical advance directive? - No - Patient declined - - - No - patient declined information -  Pre-existing out of facility DNR order (yellow form or pink MOST form) - - - - - - -    Tobacco Social History   Tobacco Use  Smoking Status Never Smoker  Smokeless Tobacco Never Used     Counseling given: Not Answered   Clinical Intake:  Pre-visit preparation completed: Yes  Pain : No/denies pain     Nutritional Status: BMI <19  Underweight Diabetes: No  How often do you need to have someone help you when you read instructions, pamphlets, or other written materials from your doctor or pharmacy?: 2 - Rarely  Interpreter Needed?: No     Past Medical History:    Diagnosis Date  . Arthritis    hands  . Breast CA ([place])    left;; S/P mastectomy 08/12/11  . Carpal tunnel syndrome, bilateral   . Heart murmur   . History of UTI    "have had a few over the years; haven't had once since ~ 2007"  . HOH (hard of hearing)   . Hyperlipidemia   . Migraines    "used to have them severe; stopped when I took self off estrogen"  . Skin cancer    Past Surgical History:  Procedure Laterality Date  . ABDOMINAL HYSTERECTOMY  ~ 1983  . APPENDECTOMY    . BREAST LUMPECTOMY  03/2009   "abnormal cells"; right  . CARPAL TUNNEL RELEASE     right; "I've had it operated on twice"  . CATARACT EXTRACTION W/PHACO Left 03/02/2017   Procedure: CATARACT EXTRACTION PHACO AND INTRAOCULAR LENS PLACEMENT (IOC);  Surgeon: Eulogio Bear, MD;  Location: [facility] ORS;  Service: Ophthalmology;  Laterality: Left;  Lot # [id number] H US:01:02.5 AP%: 13.6 CDE: 8.51  . EYE SURGERY  2006   repair of tear in retina  . MASTECTOMY COMPLETE / SIMPLE W/ SENTINEL NODE BIOPSY  08/12/11   axillary SNB; left  . MASTECTOMY W/ SENTINEL NODE BIOPSY  08/12/2011   Procedure: MASTECTOMY WITH SENTINEL LYMPH NODE BIOPSY;  Surgeon: Adin Hector, MD;  Location: [facility]  OR;  Service: General;  Laterality: Left;  . NASAL SINUS SURGERY    . SCALP MASS REMOVAL  2007   "knot; benign"   Family History  Problem Relation Age of Onset  . Stroke Mother   . Cancer Mother        ovarian  . Arthritis Father   . Cancer Maternal Aunt        breast  . Cancer Paternal Aunt        breast  . Dementia Brother   . Anesthesia problems Neg Hx    Social History   Socioeconomic History  . Marital status: Married    Spouse name: Not on file  . Number of children: 0  . Years of education: Not on file  . Highest education level: Not on file  Occupational History  . Not on file  Social Needs  . Financial resource strain: Not hard at all  . Food insecurity:    Worry: Never true    Inability: Never true  .  Transportation needs:    Medical: No    Non-medical: No  Tobacco Use  . Smoking status: Never Smoker  . Smokeless tobacco: Never Used  Substance and Sexual Activity  . Alcohol use: No    Alcohol/week: 0.0 oz  . Drug use: No  . Sexual activity: Yes  Lifestyle  . Physical activity:    Days per week: Not on file    Minutes per session: Not on file  . Stress: Not at all  Relationships  . Social connections:    Talks on phone: Not on file    Gets together: Not on file    Attends religious service: Not on file    Active member of club or organization: Not on file    Attends meetings of clubs or organizations: Not on file    Relationship status: Not on file  Other Topics Concern  . Not on file  Social History Narrative  . Not on file    Outpatient Encounter Medications as of 11/17/2017  Medication Sig  . Multiple Vitamins-Minerals (PRESERVISION AREDS 2 PO) Take by mouth.  . rosuvastatin (CRESTOR) 5 MG tablet TAKE ONE TABLET BY MOUTH EVERY DAY   No facility-administered encounter medications on file as of 11/17/2017.     Activities of Daily Living In your present state of health, do you have any difficulty performing the following activities: 11/17/2017  Hearing? N  Vision? Y  Comment age related wet macular degeneration.  visits every 6 months.   Difficulty concentrating or making decisions? Y  Comment Memory difficulty  Walking or climbing stairs? N  Dressing or bathing? N  Doing errands, shopping? Y  Comment She does not Physiological scientist and eating ? Y  Comment son assists with meal prep as needed.  self feeds.   Using the Toilet? N  In the past six months, have you accidently leaked urine? N  Do you have problems with loss of bowel control? N  Managing your Medications? Y  Comment son assists as needed  Managing your Finances? Y  Comment son assists as needed  Housekeeping or managing your Housekeeping? N  Some recent data might be hidden    Patient Care  Team: Einar Pheasant, MD as PCP - General (Unknown Physician Specialty) Marcy Panning, MD (Hematology and Oncology)    Assessment:   This is a routine wellness examination for Cassidy Martinez.  The goal of the wellness visit is to assist the patient how  to close the gaps in care and create a preventative care plan for the patient. Accompanied by her son, information obtained from both.   The roster of all physicians providing medical care to patient is listed in the Snapshot section of the chart.  Osteoporosis reviewed.    Safety issues reviewed; Lives with spouse.  Smoke and carbon monoxide detectors in the home. No firearms in the home. Wears seatbelts when riding with others. No violence in the home.  They do not have excessive sun exposure.  Discussed the need for sun protection: hats, long sleeves and the use of sunscreen if there is significant sun exposure.  No new identified risks.   BMI- discussed the importance of a healthy diet, water intake and the benefits of aerobic exercise. Walking and weight bearing exercises encouraged.   Eating well.  Weight stable.  Last 3 readings: 100lbs, 103lbs, 100lb  Dental- UTD.   Eye- Annual visits.   Sleep patterns- Sleeps without issues.   Dexa Scan discussed.   Pneumococcal vaccine discussed. Educational material provided.   Patient Concerns: None at this time. Follow up with PCP as needed.  Exercise Activities and Dietary recommendations Current Exercise Habits: Home exercise routine, Type of exercise: walking(active around the home), Time (Minutes): 10, Frequency (Times/Week): 3, Weekly Exercise (Minutes/Week): 30, Intensity: Mild  Goals    . Maintain Healthy Lifestyle     Eat a healthy diet Stay active Stay hydrated       Fall Risk Fall Risk  11/17/2017 11/17/2017 10/05/2016 08/26/2016 02/22/2016  Falls in the past year? No No No No No  Number falls in past yr: - - - - -  Injury with Fall? - - - - -   Depression Screen PHQ  2/9 Scores 11/17/2017 10/05/2016 10/05/2016 08/26/2016  PHQ - 2 Score 1 0 0 0  PHQ- 9 Score - 0 - -     Cognitive Function MMSE - Mini Mental State Exam 11/17/2017 08/26/2016 08/24/2015  Not completed: Unable to complete - -  Orientation to time - 5 5  Orientation to Place - 5 5  Registration - 3 3  Attention/ Calculation - 5 5  Recall - 2 3  Recall-comments - Recall 2 out of 3 words -  Language- name 2 objects - 2 2  Language- repeat - 1 1  Language- follow 3 step command - 3 3  Language- read & follow direction - 1 1  Write a sentence - 1 1  Copy design - 0 1  Copy design-comments - Did not draw demonstration appropriate -  Total score - 28 30        Immunization History  Administered Date(s) Administered  . Pneumococcal Conjugate-13 10/05/2016   Screening Tests Health Maintenance  Topic Date Due  . DEXA SCAN  07/29/2016  . MAMMOGRAM  09/01/2017  . PNA vac Low Risk Adult (2 of 2 - PPSV23) 10/05/2017  . TETANUS/TDAP  07/15/2024 (Originally 11/19/1950)      Plan:    End of life planning; Advance aging; Advanced directives discussed. Copy of current HCPOA/Living Will on file.    I have personally reviewed and noted the following in the patient's chart:   . Medical and social history . Use of alcohol, tobacco or illicit drugs  . Current medications and supplements . Functional ability and status . Nutritional status . Physical activity . Advanced directives . List of other physicians . Hospitalizations, surgeries, and ER visits in previous 12 months . Vitals . Screenings  to include cognitive, depression, and falls . Referrals and appointments  In addition, I have reviewed and discussed with patient certain preventive protocols, quality metrics, and best practice recommendations. A written personalized care plan for preventive services as well as general preventive health recommendations were provided to patient.     Varney Biles, LPN  01/13/5074   Reviewed  above information.  Agree with assessment and plan.    Dr Nicki Reaper

## 2017-11-20 ENCOUNTER — Encounter: Payer: Self-pay | Admitting: Internal Medicine

## 2017-11-20 NOTE — Assessment & Plan Note (Signed)
On crestor.  Cholesterol improved.  Continue current dose.  Follow lipid panel and liver function tests.

## 2017-11-20 NOTE — Assessment & Plan Note (Signed)
Saw neurology.  Felt to have late onset alzheimers disease.  Was started on aricept.  Did not tolerate.  Declines further treatment options.  Son reports as long as stable, desires no further medication,etc.

## 2017-11-20 NOTE — Assessment & Plan Note (Signed)
Has been followed by oncology.  Declines f/u mammogram.

## 2017-11-20 NOTE — Assessment & Plan Note (Signed)
Recent liver check wnl.

## 2017-11-20 NOTE — Assessment & Plan Note (Signed)
Husband appears to be stable.  Overall handling stress.  Follow.

## 2018-01-04 ENCOUNTER — Other Ambulatory Visit: Payer: Self-pay

## 2018-01-04 ENCOUNTER — Ambulatory Visit (INDEPENDENT_AMBULATORY_CARE_PROVIDER_SITE_OTHER): Payer: Medicare Other

## 2018-01-04 ENCOUNTER — Ambulatory Visit (INDEPENDENT_AMBULATORY_CARE_PROVIDER_SITE_OTHER): Payer: Medicare Other | Admitting: Internal Medicine

## 2018-01-04 ENCOUNTER — Encounter: Payer: Self-pay | Admitting: Internal Medicine

## 2018-01-04 ENCOUNTER — Other Ambulatory Visit: Payer: Self-pay | Admitting: Internal Medicine

## 2018-01-04 VITALS — BP 112/76 | HR 67 | Temp 97.7°F | Ht 63.0 in | Wt 98.4 lb

## 2018-01-04 DIAGNOSIS — H5440 Blindness, one eye, unspecified eye: Secondary | ICD-10-CM | POA: Diagnosis not present

## 2018-01-04 DIAGNOSIS — W19XXXA Unspecified fall, initial encounter: Secondary | ICD-10-CM | POA: Diagnosis not present

## 2018-01-04 DIAGNOSIS — R413 Other amnesia: Secondary | ICD-10-CM

## 2018-01-04 DIAGNOSIS — S8992XA Unspecified injury of left lower leg, initial encounter: Secondary | ICD-10-CM

## 2018-01-04 DIAGNOSIS — Z23 Encounter for immunization: Secondary | ICD-10-CM | POA: Diagnosis not present

## 2018-01-04 DIAGNOSIS — L0390 Cellulitis, unspecified: Secondary | ICD-10-CM | POA: Diagnosis not present

## 2018-01-04 DIAGNOSIS — M25562 Pain in left knee: Secondary | ICD-10-CM | POA: Diagnosis not present

## 2018-01-04 DIAGNOSIS — H5789 Other specified disorders of eye and adnexa: Secondary | ICD-10-CM | POA: Diagnosis not present

## 2018-01-04 MED ORDER — DOXYCYCLINE HYCLATE 100 MG PO TABS: 100.0000 mg | ORAL_TABLET | Freq: Two times a day (BID) | ORAL | 0 refills | Status: DC

## 2018-01-04 MED ORDER — MUPIROCIN 2 % EX OINT
1.0000 | TOPICAL_OINTMENT | Freq: Two times a day (BID) | CUTANEOUS | 0 refills | Status: DC
Start: 2018-01-04 — End: 2018-05-21

## 2018-01-04 NOTE — Progress Notes (Addendum)
Chief Complaint  Patient presents with  . Eye Pain   Acute visit with husband 1. Left knee pain s/p fall 1 week ago at grocery store missed step. Has abrasion to left knee with redness and scab nothing tried for pain pts husband trying to give Tylenol  2. Left eye redness s/p eye surgery 5 months ago c/o pain, reduced vision and redness will urgently refer to [facility]   Review of Systems  Eyes: Positive for pain and redness.       Reduced vision left eye   Respiratory: Negative for shortness of breath.   Cardiovascular: Negative for chest pain.  Musculoskeletal: Positive for joint pain.  Skin:       Left knee wound   Past Medical History:  Diagnosis Date  . Arthritis    hands  . Breast CA ([place])    left;; S/P mastectomy 08/12/11  . Carpal tunnel syndrome, bilateral   . Heart murmur   . History of UTI    "have had a few over the years; haven't had once since ~ 2007"  . HOH (hard of hearing)   . Hyperlipidemia   . Migraines    "used to have them severe; stopped when I took self off estrogen"  . Skin cancer    Past Surgical History:  Procedure Laterality Date  . ABDOMINAL HYSTERECTOMY  ~ 1983  . APPENDECTOMY    . BREAST LUMPECTOMY  03/2009   "abnormal cells"; right  . CARPAL TUNNEL RELEASE     right; "I've had it operated on twice"  . CATARACT EXTRACTION W/PHACO Left 03/02/2017   Procedure: CATARACT EXTRACTION PHACO AND INTRAOCULAR LENS PLACEMENT (IOC);  Surgeon: Eulogio Bear, MD;  Location: [facility] ORS;  Service: Ophthalmology;  Laterality: Left;  Lot # [id number] H US:01:02.5 AP%: 13.6 CDE: 8.51  . EYE SURGERY  2006   repair of tear in retina  . MASTECTOMY COMPLETE / SIMPLE W/ SENTINEL NODE BIOPSY  08/12/11   axillary SNB; left  . MASTECTOMY W/ SENTINEL NODE BIOPSY  08/12/2011   Procedure: MASTECTOMY WITH SENTINEL LYMPH NODE BIOPSY;  Surgeon: Adin Hector, MD;  Location: [facility];  Service: General;  Laterality: Left;  . NASAL SINUS SURGERY    . SCALP MASS REMOVAL   2007   "knot; benign"   Family History  Problem Relation Age of Onset  . Stroke Mother   . Cancer Mother        ovarian  . Arthritis Father   . Cancer Maternal Aunt        breast  . Cancer Paternal Aunt        breast  . Dementia Brother   . Anesthesia problems Neg Hx    Social History   Socioeconomic History  . Marital status: Married    Spouse name: Not on file  . Number of children: 0  . Years of education: Not on file  . Highest education level: Not on file  Occupational History  . Not on file  Social Needs  . Financial resource strain: Not hard at all  . Food insecurity:    Worry: Never true    Inability: Never true  . Transportation needs:    Medical: No    Non-medical: No  Tobacco Use  . Smoking status: Never Smoker  . Smokeless tobacco: Never Used  Substance and Sexual Activity  . Alcohol use: No    Alcohol/week: 0.0 oz  . Drug use: No  . Sexual activity: Yes  Lifestyle  .  Physical activity:    Days per week: Not on file    Minutes per session: Not on file  . Stress: Not at all  Relationships  . Social connections:    Talks on phone: Not on file    Gets together: Not on file    Attends religious service: Not on file    Active member of club or organization: Not on file    Attends meetings of clubs or organizations: Not on file    Relationship status: Not on file  . Intimate partner violence:    Fear of current or ex partner: No    Emotionally abused: No    Physically abused: No    Forced sexual activity: No  Other Topics Concern  . Not on file  Social History Narrative  . Not on file   Current Meds  Medication Sig  . rosuvastatin (CRESTOR) 5 MG tablet TAKE ONE TABLET BY MOUTH EVERY DAY   Allergies  Allergen Reactions  . Sulfa Antibiotics Itching    Starts at feet and rises up the body.  Santiago Bur [Nitrofurantoin Macrocrystal] Rash  . Other Rash    Poison [place]  . Penicillins Rash   Recent Results (from the past 2160 hour(s))  Basic  metabolic panel     Status: Abnormal   Collection Time: 11/16/17  7:56 AM  Result Value Ref Range   Sodium 140 135 - 145 mEq/L   Potassium 3.7 3.5 - 5.1 mEq/L   Chloride 105 96 - 112 mEq/L   CO2 28 19 - 32 mEq/L   Glucose, Bld 82 70 - 99 mg/dL   BUN 22 6 - 23 mg/dL   Creatinine, Ser 1.00 0.40 - 1.20 mg/dL   Calcium 9.4 8.4 - 10.5 mg/dL   GFR 56.14 (L) >60.00 mL/min  CBC with Differential/Platelet     Status: Abnormal   Collection Time: 11/16/17  7:56 AM  Result Value Ref Range   WBC 6.5 4.0 - 10.5 K/uL   RBC 4.51 3.87 - 5.11 Mil/uL   Hemoglobin 13.3 12.0 - 15.0 g/dL   HCT 39.9 36.0 - 46.0 %   MCV 88.6 78.0 - 100.0 fl   MCHC 33.3 30.0 - 36.0 g/dL   RDW 14.7 11.5 - 15.5 %   Platelets 194.0 150.0 - 400.0 K/uL   Neutrophils Relative % 61.4 43.0 - 77.0 %   Lymphocytes Relative 22.6 12.0 - 46.0 %   Monocytes Relative 9.1 3.0 - 12.0 %   Eosinophils Relative 5.7 (H) 0.0 - 5.0 %   Basophils Relative 1.2 0.0 - 3.0 %   Neutro Abs 4.0 1.4 - 7.7 K/uL   Lymphs Abs 1.5 0.7 - 4.0 K/uL   Monocytes Absolute 0.6 0.1 - 1.0 K/uL   Eosinophils Absolute 0.4 0.0 - 0.7 K/uL   Basophils Absolute 0.1 0.0 - 0.1 K/uL  TSH     Status: None   Collection Time: 11/16/17  7:56 AM  Result Value Ref Range   TSH 1.71 0.35 - 4.50 uIU/mL  Lipid panel     Status: Abnormal   Collection Time: 11/16/17  7:56 AM  Result Value Ref Range   Cholesterol 219 (H) 0 - 200 mg/dL    Comment: ATP III Classification       Desirable:  < 200 mg/dL               Borderline High:  200 - 239 mg/dL          High:  > =  240 mg/dL   Triglycerides 94.0 0.0 - 149.0 mg/dL    Comment: Normal:  <150 mg/dLBorderline High:  150 - 199 mg/dL   HDL 81.90 >39.00 mg/dL   VLDL 18.8 0.0 - 40.0 mg/dL   LDL Cholesterol 118 (H) 0 - 99 mg/dL   Total CHOL/HDL Ratio 3     Comment:                Men          Women1/2 Average Risk     3.4          3.3Average Risk          5.0          4.42X Average Risk          9.6          7.13X Average Risk           15.0          11.0                       NonHDL 136.89     Comment: NOTE:  Non-HDL goal should be 30 mg/dL higher than patient's LDL goal (i.e. LDL goal of < 70 mg/dL, would have non-HDL goal of < 100 mg/dL)  Hepatic function panel     Status: None   Collection Time: 11/16/17  7:56 AM  Result Value Ref Range   Total Bilirubin 0.7 0.2 - 1.2 mg/dL   Bilirubin, Direct 0.1 0.0 - 0.3 mg/dL   Alkaline Phosphatase 64 39 - 117 U/L   AST 35 0 - 37 U/L   ALT 14 0 - 35 U/L   Total Protein 6.3 6.0 - 8.3 g/dL   Albumin 3.9 3.5 - 5.2 g/dL   Objective  Body mass index is 17.43 kg/m. Wt Readings from Last 3 Encounters:  01/04/18 98 lb 6.4 oz (44.6 kg)  11/17/17 100 lb 12.8 oz (45.7 kg)  11/17/17 100 lb 1.9 oz (45.4 kg)   Temp Readings from Last 3 Encounters:  01/04/18 97.7 F (36.5 C) (Oral)  11/17/17 98.1 F (36.7 C) (Oral)  11/17/17 98.1 F (36.7 C) (Oral)   BP Readings from Last 3 Encounters:  01/04/18 112/76  11/17/17 126/72  11/17/17 126/72   Pulse Readings from Last 3 Encounters:  01/04/18 67  11/17/17 (!) 59  11/17/17 (!) 59    Physical Exam  Constitutional: She is oriented to person, place, and time. Vital signs are normal. She appears well-developed and well-nourished. She is cooperative.  HENT:  Head: Normocephalic and atraumatic.  Mouth/Throat: Oropharynx is clear and moist and mucous membranes are normal.  Eyes: Pupils are equal, round, and reactive to light. Left conjunctiva is injected. Left conjunctiva has a hemorrhage.  Cardiovascular: Normal rate, regular rhythm and normal heart sounds.  Pulmonary/Chest: Effort normal and breath sounds normal.  Musculoskeletal:       Left knee: Tenderness found. Patellar tendon tenderness noted.  Neurological: She is alert and oriented to person, place, and time. Gait normal.  Skin: Skin is warm and dry. There is erythema.     Psychiatric: She has a normal mood and affect. Her speech is normal and behavior is normal. Judgment  and thought content normal. Cognition and memory are normal.  Nursing note and vitals reviewed.   Assessment   1. Left knee pain and cellulitis s/p fall 1 week ago  2. Red painful eye with reduced vision since 5 months  worsening s/p surgery  3. Memory loss  Plan   1. Bactroban, doxy, prn Tylenol  Xray today  Tdap  2. Refer urgently [facility] f/u Dr. Edison Pace 2:45 today  3. Disc with PCP at f/u  F/u PCP in 2 weeks    Provider: Dr. Olivia Mackie McLean-Scocuzza-Internal Medicine

## 2018-01-04 NOTE — Addendum Note (Signed)
Addended by: Elpidio Galea T on: 01/04/2018 10:54 AM   Modules accepted: Orders

## 2018-01-04 NOTE — Patient Instructions (Addendum)
Use bactroban 2-3 x per day on wound  Doxycycline 100 mg 2x per day with food  Tylenol 500 mg up to 6 pills in 1 day  F/u in 2 weeks Dr. Nicki Reaper  DTaP Vaccine (Diphtheria, Tetanus, and Pertussis): What You Need to Know 1. Why get vaccinated? Diphtheria, tetanus, and pertussis are serious diseases caused by bacteria. Diphtheria and pertussis are spread from person to person. Tetanus enters the body through cuts or wounds. DIPHTHERIA causes a thick covering in the back of the throat.  It can lead to breathing problems, paralysis, heart failure, and even death.  TETANUS (Lockjaw) causes painful tightening of the muscles, usually all over the body.  It can lead to "locking" of the jaw so the victim cannot open his mouth or swallow. Tetanus leads to death in up to 2 out of 10 cases.  PERTUSSIS (Whooping Cough) causes coughing spells so bad that it is hard for infants to eat, drink, or breathe. These spells can last for weeks.  It can lead to pneumonia, seizures (jerking and staring spells), brain damage, and death.  Diphtheria, tetanus, and pertussis vaccine (DTaP) can help prevent these diseases. Most children who are vaccinated with DTaP will be protected throughout childhood. Many more children would get these diseases if we stopped vaccinating. DTaP is a safer version of an older vaccine called DTP. DTP is no longer used in the [place]. 2. Who should get DTaP vaccine and when? Children should get 5 doses of DTaP vaccine, one dose at each of the following ages:  2 months  4 months  6 months  15-18 months  4-6 years  DTaP may be given at the same time as other vaccines. 3. Some children should not get DTaP vaccine or should wait  Children with minor illnesses, such as a cold, may be vaccinated. But children who are moderately or severely ill should usually wait until they recover before getting DTaP vaccine.  Any child who had a life-threatening allergic reaction after a dose  of DTaP should not get another dose.  Any child who suffered a brain or nervous system disease within 7 days after a dose of DTaP should not get another dose.  Talk with your doctor if your child: ? had a seizure or collapsed after a dose of DTaP, ? cried non-stop for 3 hours or more after a dose of DTaP, ? had a fever over 105F after a dose of DTaP. Ask your doctor for more information. Some of these children should not get another dose of pertussis vaccine, but may get a vaccine without pertussis, called DT. 4. Older children and adults DTaP is not licensed for adolescents, adults, or children 13 years of age and older. But older people still need protection. A vaccine called Tdap is similar to DTaP. A single dose of Tdap is recommended for people 11 through 82 years of age. Another vaccine, called Td, protects against tetanus and diphtheria, but not pertussis. It is recommended every 10 years. There are separate Vaccine Information Statements for these vaccines. 5. What are the risks from DTaP vaccine? Getting diphtheria, tetanus, or pertussis disease is much riskier than getting DTaP vaccine. However, a vaccine, like any medicine, is capable of causing serious problems, such as severe allergic reactions. The risk of DTaP vaccine causing serious harm, or death, is extremely small. Mild problems (common)  Fever (up to about 1 child in 4)  Redness or swelling where the shot was given (up to about 1  child in 4)  Soreness or tenderness where the shot was given (up to about 1 child in 4) These problems occur more often after the 4th and 5th doses of the DTaP series than after earlier doses. Sometimes the 4th or 5th dose of DTaP vaccine is followed by swelling of the entire arm or leg in which the shot was given, lasting 1-7 days (up to about 1 child in 47). Other mild problems include:  Fussiness (up to about 1 child in 3)  Tiredness or poor appetite (up to about 1 child in 10)  Vomiting  (up to about 1 child in 42) These problems generally occur 1-3 days after the shot. Moderate problems (uncommon)  Seizure (jerking or staring) (about 1 child out of 14,000)  Non-stop crying, for 3 hours or more (up to about 1 child out of 1,000)  High fever, over 105F (about 1 child out of 16,000) Severe problems (very rare)  Serious allergic reaction (less than 1 out of a million doses)  Several other severe problems have been reported after DTaP vaccine. These include: ? Long-term seizures, coma, or lowered consciousness ? Permanent brain damage. These are so rare it is hard to tell if they are caused by the vaccine. Controlling fever is especially important for children who have had seizures, for any reason. It is also important if another family member has had seizures. You can reduce fever and pain by giving your child an aspirin-free pain reliever when the shot is given, and for the next 24 hours, following the package instructions. 6. What if there is a serious reaction? What should I look for? Look for anything that concerns you, such as signs of a severe allergic reaction, very high fever, or behavior changes. Signs of a severe allergic reaction can include hives, swelling of the face and throat, difficulty breathing, a fast heartbeat, dizziness, and weakness. These would start a few minutes to a few hours after the vaccination. What should I do?  If you think it is a severe allergic reaction or other emergency that can't wait, call 9-1-1 or get the person to the nearest hospital. Otherwise, call your doctor.  Afterward, the reaction should be reported to the Vaccine Adverse Event Reporting System (VAERS). Your doctor might file this report, or you can do it yourself through the VAERS web site at www.vaers.SamedayNews.es, or by calling 5012517138. ? VAERS is only for reporting reactions. They do not give medical advice. 7. The National Vaccine Injury Compensation Program The  [employer] Vaccine Injury Compensation Program (VICP) is a federal program that was created to compensate people who may have been injured by certain vaccines. Persons who believe they may have been injured by a vaccine can learn about the program and about filing a claim by calling (920)308-8132 or visiting the [place] website at GoldCloset.com.ee. 8. How can I learn more?  Ask your doctor.  Call your local or state health department.  Contact the Centers for Disease Control and Prevention (CDC): ? Call 651-146-8920 (1-800-CDC-INFO) or ? Visit CDC's website at http://hunter.com/ CDC DTaP Vaccine (Diphtheria, Tetanus, and Pertussis) VIS (10/27/05) This information is not intended to replace advice given to you by your health care provider. Make sure you discuss any questions you have with your health care provider. Document Released: 03/27/2006 Document Revised: 02/18/2016 Document Reviewed: 02/18/2016 Elsevier Interactive Patient Education  2017 [place] An abrasion is a cut or scrape on the outer surface of your skin. An abrasion  does not extend through all of the layers of your skin. It is important to care for your abrasion properly to prevent infection. What are the causes? Most abrasions are caused by falling on or gliding across the ground or another surface. When your skin rubs on something, the outer and inner layer of skin rubs off. What are the signs or symptoms? A cut or scrape is the main symptom of this condition. The scrape may be bleeding, or it may appear red or pink. If there was an associated fall, there may be an underlying bruise. How is this diagnosed? An abrasion is diagnosed with a physical exam. How is this treated? Treatment for this condition depends on how large and deep the abrasion is. Usually, your abrasion will be cleaned with water and mild soap. This removes any dirt or debris that may be stuck. An antibiotic ointment may be  applied to the abrasion to help prevent infection. A bandage (dressing) may be placed on the abrasion to keep it clean. You may also need a tetanus shot. Follow these instructions at home: Medicines  Take or apply medicines only as directed by your health care provider.  If you were prescribed an antibiotic ointment, finish all of it even if you start to feel better. Wound care  Clean the wound with mild soap and water 2-3 times per day or as directed by your health care provider. Pat your wound dry with a clean towel. Do not rub it.  There are many different ways to close and cover a wound. Follow instructions from your health care provider about: ? Wound care. ? Dressing changes and removal.  Check your wound every day for signs of infection. Watch for: ? Redness, swelling, or pain. ? Fluid, blood, or pus. General instructions   Keep the dressing dry as directed by your health care provider. Do not take baths, swim, use a hot tub, or do anything that would put your wound underwater until your health care provider approves.  If there is swelling, raise (elevate) the injured area above the level of your heart while you are sitting or lying down.  Keep all follow-up visits as directed by your health care provider. This is important. Contact a health care provider if:  You received a tetanus shot and you have swelling, severe pain, redness, or bleeding at the injection site.  Your pain is not controlled with medicine.  You have increased redness, swelling, or pain at the site of your wound. Get help right away if:  You have a red streak going away from your wound.  You have a fever.  You have fluid, blood, or pus coming from your wound.  You notice a bad smell coming from your wound or your dressing. This information is not intended to replace advice given to you by your health care provider. Make sure you discuss any questions you have with your health care  provider. Document Released: 03/09/2005 Document Revised: 01/29/2016 Document Reviewed: 05/28/2014 Elsevier Interactive Patient Education  2017 [place].  Knee Pain, Adult Knee pain in adults is common. It can be caused by many things, including:  Arthritis.  A fluid-filled sac (cyst) or growth in your knee.  An infection in your knee.  An injury that will not heal.  Damage, swelling, or irritation of the tissues that support your knee.  Knee pain is usually not a sign of a serious problem. The pain may go away on its own with time and rest.  If it does not, a health care provider may order tests to find the cause of the pain. These may include:  Imaging tests, such as an X-ray, MRI, or ultrasound.  Joint aspiration. In this test, fluid is removed from the knee.  Arthroscopy. In this test, a lighted tube is inserted into knee and an image is projected onto a TV screen.  A biopsy. In this test, a sample of tissue is removed from the body and studied under a microscope.  Follow these instructions at home: Pay attention to any changes in your symptoms. Take these actions to relieve your pain. Activity  Rest your knee.  Do not do things that cause pain or make pain worse.  Avoid high-impact activities or exercises, such as running, jumping rope, or doing jumping jacks. General instructions  Take over-the-counter and prescription medicines only as told by your health care provider.  Raise (elevate) your knee above the level of your heart when you are sitting or lying down.  Sleep with a pillow under your knee.  If directed, apply ice to the knee: ? Put ice in a plastic bag. ? Place a towel between your skin and the bag. ? Leave the ice on for 20 minutes, 2-3 times a day.  Ask your health care provider if you should wear an elastic knee support.  Lose weight if you are overweight. Extra weight can put pressure on your knee.  Do not use any products that contain  nicotine or tobacco, such as cigarettes and e-cigarettes. Smoking may slow the healing of any bone and joint problems that you may have. If you need help quitting, ask your health care provider. Contact a health care provider if:  Your knee pain continues, changes, or gets worse.  You have a fever along with knee pain.  Your knee buckles or locks up.  Your knee swells, and the swelling becomes worse. Get help right away if:  Your knee feels warm to the touch.  You cannot move your knee.  You have severe pain in your knee.  You have chest pain.  You have trouble breathing. Summary  Knee pain in adults is common. It can be caused by many things, including, arthritis, infection, cysts, or injury.  Knee pain is usually not a sign of a serious problem, but if it does not go away, a health care provider may perform tests to know the cause of the pain.  Pay attention to any changes in your symptoms. Relieve your pain with rest, medicines, light activity, and use of ice.  Get help if your pain continues or becomes very severe, or if your knee buckles or locks up, or if you have chest pain or trouble breathing. This information is not intended to replace advice given to you by your health care provider. Make sure you discuss any questions you have with your health care provider. Document Released: 03/27/2007 Document Revised: 05/20/2016 Document Reviewed: 05/20/2016 Elsevier Interactive Patient Education  [employer].

## 2018-01-15 DIAGNOSIS — H353112 Nonexudative age-related macular degeneration, right eye, intermediate dry stage: Secondary | ICD-10-CM | POA: Diagnosis not present

## 2018-01-23 ENCOUNTER — Encounter: Payer: Self-pay | Admitting: Internal Medicine

## 2018-01-23 ENCOUNTER — Ambulatory Visit (INDEPENDENT_AMBULATORY_CARE_PROVIDER_SITE_OTHER): Payer: Medicare Other | Admitting: Internal Medicine

## 2018-01-23 VITALS — BP 112/70 | HR 60 | Temp 98.2°F | Resp 18 | Wt 99.4 lb

## 2018-01-23 DIAGNOSIS — S80212D Abrasion, left knee, subsequent encounter: Secondary | ICD-10-CM | POA: Diagnosis not present

## 2018-01-23 DIAGNOSIS — H44009 Unspecified purulent endophthalmitis, unspecified eye: Secondary | ICD-10-CM | POA: Diagnosis not present

## 2018-01-23 NOTE — Progress Notes (Signed)
Patient ID: Cassidy Martinez, female   DOB: [birth year], 84 y.o.   MRN: [id number]   Subjective:    Patient ID: Cassidy Martinez, female    DOB: 31-Oct-1931, 84 y.o.   MRN: [id number]  HPI  Patient here for a scheduled follow up.  She was seen 01/04/18 with left knee pain s/p fall.  Missed step.  Found to have abrasion to left knee.  Also was evaluated for left eye redness.  Had cellulitis around her knee.  Was placed on bactroban and doxycycline.  Was referred to [facility].  Comes in today for f/u.  Eye better.  Still seeing eye MD.  Knee is better.  No evidence of infection.  Walkng.  No pain.  Overall feels she is doing well.  Eating.  Good appetite.     Past Medical History:  Diagnosis Date  . Arthritis    hands  . Breast CA ([place])    left;; S/P mastectomy 08/12/11  . Carpal tunnel syndrome, bilateral   . Heart murmur   . History of UTI    "have had a few over the years; haven't had once since ~ 2007"  . HOH (hard of hearing)   . Hyperlipidemia   . Migraines    "used to have them severe; stopped when I took self off estrogen"  . Skin cancer    Past Surgical History:  Procedure Laterality Date  . ABDOMINAL HYSTERECTOMY  ~ 1983  . APPENDECTOMY    . BREAST LUMPECTOMY  03/2009   "abnormal cells"; right  . CARPAL TUNNEL RELEASE     right; "I've had it operated on twice"  . CATARACT EXTRACTION W/PHACO Left 03/02/2017   Procedure: CATARACT EXTRACTION PHACO AND INTRAOCULAR LENS PLACEMENT (IOC);  Surgeon: Eulogio Bear, MD;  Location: [facility] ORS;  Service: Ophthalmology;  Laterality: Left;  Lot # [id number] H US:01:02.5 AP%: 13.6 CDE: 8.51  . EYE SURGERY  2006   repair of tear in retina  . MASTECTOMY COMPLETE / SIMPLE W/ SENTINEL NODE BIOPSY  08/12/11   axillary SNB; left  . MASTECTOMY W/ SENTINEL NODE BIOPSY  08/12/2011   Procedure: MASTECTOMY WITH SENTINEL LYMPH NODE BIOPSY;  Surgeon: Adin Hector, MD;  Location: [place];  Service: General;  Laterality: Left;  . NASAL SINUS SURGERY    .  SCALP MASS REMOVAL  2007   "knot; benign"   Family History  Problem Relation Age of Onset  . Stroke Mother   . Cancer Mother        ovarian  . Arthritis Father   . Cancer Maternal Aunt        breast  . Cancer Paternal Aunt        breast  . Dementia Brother   . Anesthesia problems Neg Hx    Social History   Socioeconomic History  . Marital status: Married    Spouse name: Not on file  . Number of children: 0  . Years of education: Not on file  . Highest education level: Not on file  Occupational History  . Not on file  Social Needs  . Financial resource strain: Not hard at all  . Food insecurity:    Worry: Never true    Inability: Never true  . Transportation needs:    Medical: No    Non-medical: No  Tobacco Use  . Smoking status: Never Smoker  . Smokeless tobacco: Never Used  Substance and Sexual Activity  . Alcohol use: No    Alcohol/week:  0.0 standard drinks  . Drug use: No  . Sexual activity: Yes  Lifestyle  . Physical activity:    Days per week: Not on file    Minutes per session: Not on file  . Stress: Not at all  Relationships  . Social connections:    Talks on phone: Not on file    Gets together: Not on file    Attends religious service: Not on file    Active member of club or organization: Not on file    Attends meetings of clubs or organizations: Not on file    Relationship status: Not on file  Other Topics Concern  . Not on file  Social History Narrative  . Not on file    Outpatient Encounter Medications as of 01/23/2018  Medication Sig  . Multiple Vitamins-Minerals (PRESERVISION AREDS 2 PO) Take by mouth.  . mupirocin ointment (BACTROBAN) 2 % Apply 1 application topically 2 (two) times daily. Left knee  . rosuvastatin (CRESTOR) 5 MG tablet TAKE ONE TABLET BY MOUTH EVERY DAY  . [DISCONTINUED] doxycycline (VIBRA-TABS) 100 MG tablet Take 1 tablet (100 mg total) by mouth 2 (two) times daily. With food and full glass of water   No  facility-administered encounter medications on file as of 01/23/2018.     Review of Systems  Constitutional: Negative for appetite change and fever.  Respiratory: Negative for chest tightness and shortness of breath.   Cardiovascular: Negative for leg swelling.  Gastrointestinal: Negative for diarrhea, nausea and vomiting.  Musculoskeletal: Negative for joint swelling and myalgias.  Skin: Negative for color change and rash.  Neurological: Negative for dizziness, light-headedness and headaches.  Hematological: Positive for adenopathy.  Psychiatric/Behavioral: Negative for agitation and dysphoric mood.       Objective:    Physical Exam  Constitutional: She appears well-developed and well-nourished. No distress.  HENT:  Nose: Nose normal.  Mouth/Throat: Oropharynx is clear and moist.  Neck: Neck supple.  Cardiovascular: Normal rate and regular rhythm.  Pulmonary/Chest: Breath sounds normal. No respiratory distress. She has no wheezes.  Abdominal: Soft. Bowel sounds are normal. There is no tenderness.  Musculoskeletal: She exhibits no edema or tenderness.  Lymphadenopathy:    She has no cervical adenopathy.  Skin: No rash noted. No erythema.  Healing abrasion - no pain.  No swelling - knee.    Psychiatric: She has a normal mood and affect. Her behavior is normal.    BP 112/70 (BP Location: Left Arm, Patient Position: Sitting, Cuff Size: Normal)   Pulse 60   Temp 98.2 F (36.8 C) (Oral)   Resp 18   Wt 99 lb 6.4 oz (45.1 kg)   SpO2 98%   BMI 17.61 kg/m  Wt Readings from Last 3 Encounters:  01/23/18 99 lb 6.4 oz (45.1 kg)  01/04/18 98 lb 6.4 oz (44.6 kg)  11/17/17 100 lb 12.8 oz (45.7 kg)     Lab Results  Component Value Date   WBC 6.5 11/16/2017   HGB 13.3 11/16/2017   HCT 39.9 11/16/2017   PLT 194.0 11/16/2017   GLUCOSE 82 11/16/2017   CHOL 219 (H) 11/16/2017   TRIG 94.0 11/16/2017   HDL 81.90 11/16/2017   LDLDIRECT 174.2 07/09/2013   LDLCALC 118 (H) 11/16/2017    ALT 14 11/16/2017   AST 35 11/16/2017   NA 140 11/16/2017   K 3.7 11/16/2017   CL 105 11/16/2017   CREATININE 1.00 11/16/2017   BUN 22 11/16/2017   CO2 28 11/16/2017   TSH  1.71 11/16/2017       Assessment & Plan:   Problem List Items Addressed This Visit    None    Visit Diagnoses    Eye infection, unspecified laterality    -  Primary   Saw opthalmology.  Better.  Continue f/u with eye MD    Abrasion of left knee, subsequent encounter       Doing well.  Completed abx.  No evidence of infection.  No pain or swelling.  Follow.         Einar Pheasant, MD

## 2018-01-28 ENCOUNTER — Encounter: Payer: Self-pay | Admitting: Internal Medicine

## 2018-05-14 DIAGNOSIS — H353112 Nonexudative age-related macular degeneration, right eye, intermediate dry stage: Secondary | ICD-10-CM | POA: Diagnosis not present

## 2018-05-14 DIAGNOSIS — H5440 Blindness, one eye, unspecified eye: Secondary | ICD-10-CM | POA: Diagnosis not present

## 2018-05-16 ENCOUNTER — Other Ambulatory Visit (INDEPENDENT_AMBULATORY_CARE_PROVIDER_SITE_OTHER): Payer: Medicare Other

## 2018-05-16 DIAGNOSIS — E7800 Pure hypercholesterolemia, unspecified: Secondary | ICD-10-CM | POA: Diagnosis not present

## 2018-05-16 LAB — HEPATIC FUNCTION PANEL
ALT: 13 U/L (ref 0–35)
AST: 33 U/L (ref 0–37)
Albumin: 4.1 g/dL (ref 3.5–5.2)
Alkaline Phosphatase: 68 U/L (ref 39–117)
BILIRUBIN DIRECT: 0.1 mg/dL (ref 0.0–0.3)
TOTAL PROTEIN: 6.8 g/dL (ref 6.0–8.3)
Total Bilirubin: 0.5 mg/dL (ref 0.2–1.2)

## 2018-05-16 LAB — LIPID PANEL
CHOL/HDL RATIO: 4
Cholesterol: 294 mg/dL — ABNORMAL HIGH (ref 0–200)
HDL: 75.1 mg/dL (ref >39.00)
LDL CALC: 196 mg/dL — AB (ref 0–99)
NONHDL: 219.22
TRIGLYCERIDES: 115 mg/dL (ref 0.0–149.0)
VLDL: 23 mg/dL (ref 0.0–40.0)

## 2018-05-16 LAB — BASIC METABOLIC PANEL
BUN: 25 mg/dL — AB (ref 6–23)
CHLORIDE: 106 meq/L (ref 96–112)
CO2: 26 meq/L (ref 19–32)
CREATININE: 1.06 mg/dL (ref 0.40–1.20)
Calcium: 9.7 mg/dL (ref 8.4–10.5)
GFR: 52.43 mL/min — ABNORMAL LOW (ref >60.00)
Glucose, Bld: 96 mg/dL (ref 70–99)
POTASSIUM: 4.4 meq/L (ref 3.5–5.1)
Sodium: 141 mEq/L (ref 135–145)

## 2018-05-21 ENCOUNTER — Ambulatory Visit (INDEPENDENT_AMBULATORY_CARE_PROVIDER_SITE_OTHER): Payer: Medicare Other | Admitting: Internal Medicine

## 2018-05-21 ENCOUNTER — Encounter: Payer: Self-pay | Admitting: Internal Medicine

## 2018-05-21 ENCOUNTER — Encounter: Payer: Medicare Other | Admitting: Internal Medicine

## 2018-05-21 VITALS — BP 148/80 | HR 60 | Temp 98.1°F | Resp 16 | Wt 105.0 lb

## 2018-05-21 DIAGNOSIS — E538 Deficiency of other specified B group vitamins: Secondary | ICD-10-CM

## 2018-05-21 DIAGNOSIS — R21 Rash and other nonspecific skin eruption: Secondary | ICD-10-CM | POA: Diagnosis not present

## 2018-05-21 DIAGNOSIS — M810 Age-related osteoporosis without current pathological fracture: Secondary | ICD-10-CM | POA: Diagnosis not present

## 2018-05-21 DIAGNOSIS — R030 Elevated blood-pressure reading, without diagnosis of hypertension: Secondary | ICD-10-CM | POA: Diagnosis not present

## 2018-05-21 DIAGNOSIS — H353290 Exudative age-related macular degeneration, unspecified eye, stage unspecified: Secondary | ICD-10-CM

## 2018-05-21 DIAGNOSIS — R945 Abnormal results of liver function studies: Secondary | ICD-10-CM | POA: Diagnosis not present

## 2018-05-21 DIAGNOSIS — E7800 Pure hypercholesterolemia, unspecified: Secondary | ICD-10-CM

## 2018-05-21 DIAGNOSIS — R413 Other amnesia: Secondary | ICD-10-CM

## 2018-05-21 MED ORDER — TRIAMCINOLONE ACETONIDE 0.1 % EX CREA
1.0000 | TOPICAL_CREAM | Freq: Two times a day (BID) | CUTANEOUS | 0 refills | Status: DC
Start: 2018-05-21 — End: 2019-02-12

## 2018-05-21 NOTE — Progress Notes (Signed)
Patient ID: Cassidy Martinez, female   DOB: 1931-12-31, 84 y.o.   MRN: [id number]   Subjective:    Patient ID: Cassidy Martinez, female    DOB: 09-16-1931, 84 y.o.   MRN: [id number]  HPI  Patient here for a scheduled follow up.  Declines physical exam.  She is accompanied by her son and daughter-n-law.  History obtained from pt and son.  She has been having issues with her eye - left eye. Loss of vision in that eye.  Has been followed and is being followed by ophthalmology.  Using multiple drops.  Increased stress related to this.  They feel things are stable.  She is eating.  Weight is up several pounds.  No chest pain.  Breathing stable.  No acid reflux.  No abdominal pain.  Bowels moving. Discussed labs.  Cholesterol elevated.  Was on cholesterol medication.  No longer taking.  Discussed restarting.  Son wants to talk to his father before restarting.  Memory - some worsening.  Has been evaluated by neurology.  Had intolerance to aricept.  Discussed other treatment options.  He declines.     Past Medical History:  Diagnosis Date  . Arthritis    hands  . Breast CA ([place])    left;; S/P mastectomy 08/12/11  . Carpal tunnel syndrome, bilateral   . Heart murmur   . History of UTI    "have had a few over the years; haven't had once since ~ 2007"  . HOH (hard of hearing)   . Hyperlipidemia   . Migraines    "used to have them severe; stopped when I took self off estrogen"  . Skin cancer    Past Surgical History:  Procedure Laterality Date  . ABDOMINAL HYSTERECTOMY  ~ 1983  . APPENDECTOMY    . BREAST LUMPECTOMY  03/2009   "abnormal cells"; right  . CARPAL TUNNEL RELEASE     right; "I've had it operated on twice"  . CATARACT EXTRACTION W/PHACO Left 03/02/2017   Procedure: CATARACT EXTRACTION PHACO AND INTRAOCULAR LENS PLACEMENT (IOC);  Surgeon: Eulogio Bear, MD;  Location: [facility];  Service: Ophthalmology;  Laterality: Left;  Lot # [id number] H US:01:02.5 AP%: 13.6 CDE: 8.51  . EYE SURGERY  2006     repair of tear in retina  . MASTECTOMY COMPLETE / SIMPLE W/ SENTINEL NODE BIOPSY  08/12/11   axillary SNB; left  . MASTECTOMY W/ SENTINEL NODE BIOPSY  08/12/2011   Procedure: MASTECTOMY WITH SENTINEL LYMPH NODE BIOPSY;  Surgeon: Adin Hector, MD;  Location: [place];  Service: General;  Laterality: Left;  . NASAL SINUS SURGERY    . SCALP MASS REMOVAL  2007   "knot; benign"   Family History  Problem Relation Age of Onset  . Stroke Mother   . Cancer Mother        ovarian  . Arthritis Father   . Cancer Maternal Aunt        breast  . Cancer Paternal Aunt        breast  . Dementia Brother   . Anesthesia problems Neg Hx    Social History   Socioeconomic History  . Marital status: Married    Spouse name: Not on file  . Number of children: 0  . Years of education: Not on file  . Highest education level: Not on file  Occupational History  . Not on file  Social Needs  . Financial resource strain: Not hard at all  . Food insecurity:  Worry: Never true    Inability: Never true  . Transportation needs:    Medical: No    Non-medical: No  Tobacco Use  . Smoking status: Never Smoker  . Smokeless tobacco: Never Used  Substance and Sexual Activity  . Alcohol use: No    Alcohol/week: 0.0 standard drinks  . Drug use: No  . Sexual activity: Yes  Lifestyle  . Physical activity:    Days per week: Not on file    Minutes per session: Not on file  . Stress: Not at all  Relationships  . Social connections:    Talks on phone: Not on file    Gets together: Not on file    Attends religious service: Not on file    Active member of club or organization: Not on file    Attends meetings of clubs or organizations: Not on file    Relationship status: Not on file  Other Topics Concern  . Not on file  Social History Narrative  . Not on file    Outpatient Encounter Medications as of 05/21/2018  Medication Sig  . triamcinolone cream (KENALOG) 0.1 % Apply 1 application topically 2 (two)  times daily. appy to affected area on leg bid  . [DISCONTINUED] Multiple Vitamins-Minerals (PRESERVISION AREDS 2 PO) Take by mouth.  . [DISCONTINUED] mupirocin ointment (BACTROBAN) 2 % Apply 1 application topically 2 (two) times daily. Left knee  . [DISCONTINUED] rosuvastatin (CRESTOR) 5 MG tablet TAKE ONE TABLET BY MOUTH EVERY DAY   No facility-administered encounter medications on file as of 05/21/2018.     Review of Systems  Constitutional: Negative for appetite change and unexpected weight change.  HENT: Negative for congestion and sinus pressure.   Respiratory: Negative for cough, chest tightness and shortness of breath.   Cardiovascular: Negative for chest pain, palpitations and leg swelling.  Gastrointestinal: Negative for abdominal pain, diarrhea, nausea and vomiting.  Genitourinary: Negative for difficulty urinating and dysuria.  Musculoskeletal: Negative for joint swelling and myalgias.  Skin: Negative for color change and rash.  Neurological: Negative for dizziness, light-headedness and headaches.  Psychiatric/Behavioral: Negative for agitation and dysphoric mood.       Objective:    Physical Exam  Constitutional: She appears well-developed and well-nourished. No distress.  HENT:  Nose: Nose normal.  Mouth/Throat: Oropharynx is clear and moist.  Neck: Neck supple. No thyromegaly present.  Cardiovascular: Normal rate and regular rhythm.  Pulmonary/Chest: Breath sounds normal. No respiratory distress. She has no wheezes.  Abdominal: Soft. Bowel sounds are normal. There is no tenderness.  Musculoskeletal: She exhibits no edema or tenderness.  Lymphadenopathy:    She has no cervical adenopathy.  Skin: No erythema.  Localized erythematous rash - left lower extremity.   Psychiatric: She has a normal mood and affect. Her behavior is normal.    BP (!) 148/80 (BP Location: Left Arm, Patient Position: Sitting, Cuff Size: Normal)   Pulse 60   Temp 98.1 F (36.7 C) (Oral)    Resp 16   Wt 105 lb (47.6 kg)   SpO2 98%   BMI 18.60 kg/m  Wt Readings from Last 3 Encounters:  05/21/18 105 lb (47.6 kg)  01/23/18 99 lb 6.4 oz (45.1 kg)  01/04/18 98 lb 6.4 oz (44.6 kg)     Lab Results  Component Value Date   WBC 6.5 11/16/2017   HGB 13.3 11/16/2017   HCT 39.9 11/16/2017   PLT 194.0 11/16/2017   GLUCOSE 96 05/16/2018   CHOL 294 (H) 05/16/2018  TRIG 115.0 05/16/2018   HDL 75.10 05/16/2018   LDLDIRECT 174.2 07/09/2013   LDLCALC 196 (H) 05/16/2018   ALT 13 05/16/2018   AST 33 05/16/2018   NA 141 05/16/2018   K 4.4 05/16/2018   CL 106 05/16/2018   CREATININE 1.06 05/16/2018   BUN 25 (H) 05/16/2018   CO2 26 05/16/2018   TSH 1.71 11/16/2017       Assessment & Plan:   Problem List Items Addressed This Visit    Abnormal liver function    Recent liver function tests wnl.       AMD (age-related macular degeneration), wet ([place])    Followed by ophthalmology.        B12 deficiency    Have recommended continued oral b12.  Will follow.        Elevated blood pressure reading    Will have them spot check her pressure.  Send in readings.        Hypercholesterolemia    Discussed recent cholesterol labs.  Discussed starting cholesterol medication. Wants to talk with her husband prior to starting.  Follow lipid panel.        Relevant Orders   Lipid panel   Hepatic function panel   Basic metabolic panel   Memory loss    Saw neurology.  Felt to have late onset alzheimers disease.  Intolerant to aricept.  Son reports some worsening.  Dicussed other medications, for ex., namenda. Declines.        Relevant Orders   Vitamin B12   Osteoporosis    Have discussed treatment options.  Was supposed to be on fosamax.  Not taking now.  Dietary calcium, calcium and vitamin D and weight bearing exercise.        Relevant Orders   VITAMIN D 25 Hydroxy (Vit-D Deficiency, Fractures)   Rash - Primary       Einar Pheasant, MD

## 2018-05-22 ENCOUNTER — Encounter: Payer: Self-pay | Admitting: Internal Medicine

## 2018-05-22 NOTE — Assessment & Plan Note (Signed)
Followed by ophthalmology.   

## 2018-05-22 NOTE — Assessment & Plan Note (Signed)
Discussed recent cholesterol labs.  Discussed starting cholesterol medication. Wants to talk with her husband prior to starting.  Follow lipid panel.

## 2018-05-22 NOTE — Assessment & Plan Note (Signed)
Will have them spot check her pressure.  Send in readings.

## 2018-05-22 NOTE — Assessment & Plan Note (Signed)
Recent liver function tests wnl.

## 2018-05-22 NOTE — Assessment & Plan Note (Signed)
Have recommended continued oral b12.  Will follow.

## 2018-05-22 NOTE — Assessment & Plan Note (Signed)
Saw neurology.  Felt to have late onset alzheimers disease.  Intolerant to aricept.  Son reports some worsening.  Dicussed other medications, for ex., namenda. Declines.

## 2018-05-22 NOTE — Assessment & Plan Note (Signed)
Have discussed treatment options.  Was supposed to be on fosamax.  Not taking now.  Dietary calcium, calcium and vitamin D and weight bearing exercise.

## 2018-09-24 ENCOUNTER — Other Ambulatory Visit: Payer: Medicare Other

## 2018-09-24 ENCOUNTER — Telehealth: Payer: Self-pay

## 2018-09-24 NOTE — Telephone Encounter (Signed)
Copied from [place] [id number]. Topic: Quick Communication - Appointment Cancellation >> Sep 21, 2018  8:42 AM Cassidy Martinez wrote: Patient called to cancel appointment scheduled for 09/26/18. Patient HAS NOT rescheduled their appointment.

## 2018-09-24 NOTE — Telephone Encounter (Signed)
Pt is doing phone appt per schedule

## 2018-09-26 ENCOUNTER — Ambulatory Visit (INDEPENDENT_AMBULATORY_CARE_PROVIDER_SITE_OTHER): Payer: Medicare Other | Admitting: Internal Medicine

## 2018-09-26 ENCOUNTER — Encounter: Payer: Self-pay | Admitting: Internal Medicine

## 2018-09-26 ENCOUNTER — Ambulatory Visit: Payer: Medicare Other | Admitting: Internal Medicine

## 2018-09-26 DIAGNOSIS — R413 Other amnesia: Secondary | ICD-10-CM

## 2018-09-26 DIAGNOSIS — E538 Deficiency of other specified B group vitamins: Secondary | ICD-10-CM

## 2018-09-26 DIAGNOSIS — Z853 Personal history of malignant neoplasm of breast: Secondary | ICD-10-CM | POA: Diagnosis not present

## 2018-09-26 DIAGNOSIS — H353290 Exudative age-related macular degeneration, unspecified eye, stage unspecified: Secondary | ICD-10-CM

## 2018-09-26 DIAGNOSIS — E7800 Pure hypercholesterolemia, unspecified: Secondary | ICD-10-CM | POA: Diagnosis not present

## 2018-09-26 DIAGNOSIS — R945 Abnormal results of liver function studies: Secondary | ICD-10-CM

## 2018-09-26 NOTE — Progress Notes (Addendum)
Patient ID: Cassidy Martinez, female   DOB: 08/04/1931, 85 y.o.   MRN: [id number] Telephone Visit:  Note  This visit type was conducted due to national recommendations for restrictions regarding the COVID-19 pandemic (e.g. social distancing).  This format is felt to be most appropriate for this patient at this time.  All issues noted in this document were discussed and addressed.  No physical exam was performed (except for noted visual exam findings with Video Visits).   I connected with Cassidy Martinez on 09/26/18 at 10:30 AM EDT by a video enabled telemedicine application.  Verified that I am speaking with the correct person using two identifiers. Location patient: home Location provider: work Persons participating in the virtual visit: patient, patient's husband Cassidy Martinez) and provider  I discussed the limitations, risks, security and privacy concerns of performing an evaluation and management service by telephone and the availability of in person appointments.  The patient expressed understanding and agreed to proceed.   Reason for visit: scheduled follow up visit.    HPI: Spoke with pt and her husband.  Husband feels things are stable.  Still with memory issues.  States some days are better than others, but overall stable.  Has seen neurology.  Family declines starting her on aricept, etc.  They are staying in.  No known COVID exposures.  Denies fever. No cough or congestion.  Previous cold, but this has resolved.  No sob.  Tries to stay active.  No chest pain.  No acid reflux.  No abdominal pain.  Bowels moving.  Still seeing ophthalmology.  Visits postponed for now.  She is eating.  Husband feels she has gained some weight.  Is overdue mammogram. Discussed today.  Declines mammogram at this time.  Since unable to come in for B12 injections, will start oral B12.     ROS: See pertinent positives and negatives per HPI.  Past Medical History:  Diagnosis Date  . Arthritis    hands  . Breast CA ([place])     left;; S/P mastectomy 08/12/11  . Carpal tunnel syndrome, bilateral   . Heart murmur   . History of UTI    "have had a few over the years; haven't had once since ~ 2007"  . HOH (hard of hearing)   . Hyperlipidemia   . Migraines    "used to have them severe; stopped when I took self off estrogen"  . Skin cancer     Past Surgical History:  Procedure Laterality Date  . ABDOMINAL HYSTERECTOMY  ~ 1983  . APPENDECTOMY    . BREAST LUMPECTOMY  03/2009   "abnormal cells"; right  . CARPAL TUNNEL RELEASE     right; "I've had it operated on twice"  . CATARACT EXTRACTION W/PHACO Left 03/02/2017   Procedure: CATARACT EXTRACTION PHACO AND INTRAOCULAR LENS PLACEMENT (IOC);  Surgeon: Eulogio Bear, MD;  Location: [facility];  Service: Ophthalmology;  Laterality: Left;  Lot # [id number] H US:01:02.5 AP%: 13.6 CDE: 8.51  . EYE SURGERY  2006   repair of tear in retina  . MASTECTOMY COMPLETE / SIMPLE W/ SENTINEL NODE BIOPSY  08/12/11   axillary SNB; left  . MASTECTOMY W/ SENTINEL NODE BIOPSY  08/12/2011   Procedure: MASTECTOMY WITH SENTINEL LYMPH NODE BIOPSY;  Surgeon: Adin Hector, MD;  Location: [place];  Service: General;  Laterality: Left;  . NASAL SINUS SURGERY    . SCALP MASS REMOVAL  2007   "knot; benign"    Family History  Problem Relation Age  of Onset  . Stroke Mother   . Cancer Mother        ovarian  . Arthritis Father   . Cancer Maternal Aunt        breast  . Cancer Paternal Aunt        breast  . Dementia Brother   . Anesthesia problems Neg Hx     SOCIAL HX: reviewed.    Current Outpatient Medications:  .  atropine 1 % ophthalmic solution, , Disp: , Rfl:  .  Carboxymethylcellulose Sodium (REFRESH LIQUIGEL) 1 % GEL, Place 1 drop into both eyes 2 (two) times daily., Disp: , Rfl:  .  rosuvastatin (CRESTOR) 5 MG tablet, Take 5 mg by mouth daily., Disp: , Rfl:  .  triamcinolone cream (KENALOG) 0.1 %, Apply 1 application topically 2 (two) times daily. appy to affected area on leg  bid, Disp: 30 g, Rfl: 0  EXAM:  GENERAL: alert.  Sounds to be in no acute distress.  Answering questions.    PSYCH/NEURO: pleasant and cooperative, no obvious depression or anxiety, speech and thought processing grossly intact  ASSESSMENT AND PLAN:  Discussed the following assessment and plan:  Abnormal liver function  Exudative age-related macular degeneration, unspecified laterality, unspecified stage ([facility])  B12 deficiency  Hypercholesterolemia  Memory loss  History of breast cancer  Abnormal liver function Follow liver function tests.    AMD (age-related macular degeneration), wet ([place]) Followed by ophthalmology.    B12 deficiency Discussed taking oral B12.    Hypercholesterolemia Not taking cholesterol medication regularly.  Follow lipid panel and liver function tests.    Memory loss Saw neurology.  Felt to have late onset alzheimers disease.  Intolerant to aricept.  Husband reports relatively stable.  Some days better than others.  Have desired no further medication or evaluation.    History of breast cancer Was followed by oncology.  Discussed f/u mammogram.  They decline.      I discussed the assessment and treatment plan with the patient. The patient was provided an opportunity to ask questions and all were answered. The patient agreed with the plan and demonstrated an understanding of the instructions.   The patient was advised to call back or seek an in-person evaluation if the symptoms worsen or if the condition fails to improve as anticipated.  I provided 15 minutes of non-face-to-face time during this encounter.   Einar Pheasant, MD

## 2018-09-29 ENCOUNTER — Encounter: Payer: Self-pay | Admitting: Internal Medicine

## 2018-09-29 NOTE — Assessment & Plan Note (Signed)
Not taking cholesterol medication regularly.  Follow lipid panel and liver function tests.

## 2018-09-29 NOTE — Assessment & Plan Note (Signed)
Discussed taking oral B12.

## 2018-09-29 NOTE — Assessment & Plan Note (Signed)
Saw neurology.  Felt to have late onset alzheimers disease.  Intolerant to aricept.  Husband reports relatively stable.  Some days better than others.  Have desired no further medication or evaluation.

## 2018-09-29 NOTE — Assessment & Plan Note (Signed)
Follow liver function tests.   

## 2018-09-29 NOTE — Assessment & Plan Note (Signed)
Followed by ophthalmology.   

## 2018-09-29 NOTE — Assessment & Plan Note (Signed)
Was followed by oncology.  Discussed f/u mammogram.  They decline.

## 2018-11-13 DIAGNOSIS — H5440 Blindness, one eye, unspecified eye: Secondary | ICD-10-CM | POA: Diagnosis not present

## 2018-11-13 DIAGNOSIS — H353112 Nonexudative age-related macular degeneration, right eye, intermediate dry stage: Secondary | ICD-10-CM | POA: Diagnosis not present

## 2019-02-05 ENCOUNTER — Telehealth: Payer: Self-pay

## 2019-02-05 NOTE — Telephone Encounter (Signed)
Called and spoke to patient's son Jeneen Rinks.  Confirmed that f/u appt will be a phone appointment.

## 2019-02-05 NOTE — Telephone Encounter (Signed)
Copied from [place] [id number]. Topic: General - Inquiry >> Feb 05, 2019  2:12 PM Virl Axe D wrote: Reason for CRM: Pt's son Jeneen Rinks called to follow up about pt's appt on 02/11/19 and 02/12/19. He stated that Dr. Nicki Reaper called pt for her last appt and would like to know if that can be the case this time. Also he would like to know if the lab work is necessary. Requesting callback. Please advise.

## 2019-02-06 NOTE — Telephone Encounter (Signed)
Patient's son prefers if patient comes into office-changed appointment note.

## 2019-02-06 NOTE — Telephone Encounter (Signed)
noted 

## 2019-02-08 ENCOUNTER — Ambulatory Visit: Payer: Medicare Other | Admitting: Internal Medicine

## 2019-02-11 ENCOUNTER — Other Ambulatory Visit (INDEPENDENT_AMBULATORY_CARE_PROVIDER_SITE_OTHER): Payer: Medicare Other

## 2019-02-11 ENCOUNTER — Other Ambulatory Visit: Payer: Self-pay

## 2019-02-11 DIAGNOSIS — R413 Other amnesia: Secondary | ICD-10-CM

## 2019-02-11 DIAGNOSIS — M810 Age-related osteoporosis without current pathological fracture: Secondary | ICD-10-CM | POA: Diagnosis not present

## 2019-02-11 DIAGNOSIS — E7800 Pure hypercholesterolemia, unspecified: Secondary | ICD-10-CM

## 2019-02-11 LAB — VITAMIN D 25 HYDROXY (VIT D DEFICIENCY, FRACTURES): VITD: 18.55 ng/mL — ABNORMAL LOW (ref 30.00–100.00)

## 2019-02-11 LAB — HEPATIC FUNCTION PANEL
ALT: 9 U/L (ref 0–35)
AST: 29 U/L (ref 0–37)
Albumin: 4 g/dL (ref 3.5–5.2)
Alkaline Phosphatase: 55 U/L (ref 39–117)
Bilirubin, Direct: 0.1 mg/dL (ref 0.0–0.3)
Total Bilirubin: 0.8 mg/dL (ref 0.2–1.2)
Total Protein: 6.2 g/dL (ref 6.0–8.3)

## 2019-02-11 LAB — BASIC METABOLIC PANEL WITH GFR
BUN: 22 mg/dL (ref 6–23)
CO2: 26 meq/L (ref 19–32)
Calcium: 9.1 mg/dL (ref 8.4–10.5)
Chloride: 105 meq/L (ref 96–112)
Creatinine, Ser: 1.06 mg/dL (ref 0.40–1.20)
GFR: 49.24 mL/min — ABNORMAL LOW
Glucose, Bld: 85 mg/dL (ref 70–99)
Potassium: 3.9 meq/L (ref 3.5–5.1)
Sodium: 140 meq/L (ref 135–145)

## 2019-02-11 LAB — LIPID PANEL
Cholesterol: 324 mg/dL — ABNORMAL HIGH (ref 0–200)
HDL: 80.8 mg/dL
LDL Cholesterol: 221 mg/dL — ABNORMAL HIGH (ref 0–99)
NonHDL: 243.07
Total CHOL/HDL Ratio: 4
Triglycerides: 109 mg/dL (ref 0.0–149.0)
VLDL: 21.8 mg/dL (ref 0.0–40.0)

## 2019-02-11 LAB — VITAMIN B12: Vitamin B-12: 146 pg/mL — ABNORMAL LOW (ref 211–911)

## 2019-02-12 ENCOUNTER — Encounter: Payer: Self-pay | Admitting: Internal Medicine

## 2019-02-12 ENCOUNTER — Ambulatory Visit (INDEPENDENT_AMBULATORY_CARE_PROVIDER_SITE_OTHER): Payer: Medicare Other | Admitting: Internal Medicine

## 2019-02-12 ENCOUNTER — Other Ambulatory Visit: Payer: Self-pay

## 2019-02-12 VITALS — BP 102/60 | HR 62 | Temp 96.1°F | Resp 16 | Wt 102.2 lb

## 2019-02-12 DIAGNOSIS — F0280 Dementia in other diseases classified elsewhere without behavioral disturbance: Secondary | ICD-10-CM

## 2019-02-12 DIAGNOSIS — H353290 Exudative age-related macular degeneration, unspecified eye, stage unspecified: Secondary | ICD-10-CM

## 2019-02-12 DIAGNOSIS — E7800 Pure hypercholesterolemia, unspecified: Secondary | ICD-10-CM

## 2019-02-12 DIAGNOSIS — G301 Alzheimer's disease with late onset: Secondary | ICD-10-CM | POA: Diagnosis not present

## 2019-02-12 DIAGNOSIS — Z853 Personal history of malignant neoplasm of breast: Secondary | ICD-10-CM | POA: Diagnosis not present

## 2019-02-12 DIAGNOSIS — L989 Disorder of the skin and subcutaneous tissue, unspecified: Secondary | ICD-10-CM | POA: Diagnosis not present

## 2019-02-12 DIAGNOSIS — R945 Abnormal results of liver function studies: Secondary | ICD-10-CM

## 2019-02-12 DIAGNOSIS — E538 Deficiency of other specified B group vitamins: Secondary | ICD-10-CM | POA: Diagnosis not present

## 2019-02-12 MED ORDER — CYANOCOBALAMIN 1000 MCG/ML IJ SOLN
1000.0000 ug | Freq: Once | INTRAMUSCULAR | Status: AC
  Administered 2019-02-12: 1000 ug via INTRAMUSCULAR

## 2019-02-12 NOTE — Progress Notes (Signed)
Patient ID: Cassidy Martinez, female   DOB: 1931/09/07, 85 y.o.   MRN: [id number]   Subjective:    Patient ID: Cassidy Martinez, female    DOB: 07-28-1931, 85 y.o.   MRN: [id number]  HPI  Patient here for a scheduled follow up.  She is accompanied by her son.  History obtained from both of them.  Having issues with her memory.  Previously saw neurology.  Diagnosed with probable late onset alzheimers dementia.  Was started on aricept.  Did not tolerate.  Declines trial of any other medication.  Stays active.  No chest pain.  No sob.  No acid reflux. No abdominal pain.  Bowels moving.  Eating well.  Has lost more weight.  Has a chest lesion.  Discussed dermatology referral.  Declines mammogram and further testing.     Past Medical History:  Diagnosis Date  . Arthritis    hands  . Breast CA ([place])    left;; S/P mastectomy 08/12/11  . Carpal tunnel syndrome, bilateral   . Heart murmur   . History of UTI    "have had a few over the years; haven't had once since ~ 2007"  . HOH (hard of hearing)   . Hyperlipidemia   . Migraines    "used to have them severe; stopped when I took self off estrogen"  . Skin cancer    Past Surgical History:  Procedure Laterality Date  . ABDOMINAL HYSTERECTOMY  ~ 1983  . APPENDECTOMY    . BREAST LUMPECTOMY  03/2009   "abnormal cells"; right  . CARPAL TUNNEL RELEASE     right; "I've had it operated on twice"  . CATARACT EXTRACTION W/PHACO Left 03/02/2017   Procedure: CATARACT EXTRACTION PHACO AND INTRAOCULAR LENS PLACEMENT (IOC);  Surgeon: Eulogio Bear, MD;  Location: [facility];  Service: Ophthalmology;  Laterality: Left;  Lot # [id number] H US:01:02.5 AP%: 13.6 CDE: 8.51  . EYE SURGERY  2006   repair of tear in retina  . MASTECTOMY COMPLETE / SIMPLE W/ SENTINEL NODE BIOPSY  08/12/11   axillary SNB; left  . MASTECTOMY W/ SENTINEL NODE BIOPSY  08/12/2011   Procedure: MASTECTOMY WITH SENTINEL LYMPH NODE BIOPSY;  Surgeon: Adin Hector, MD;  Location: [place];  Service:  General;  Laterality: Left;  . NASAL SINUS SURGERY    . SCALP MASS REMOVAL  2007   "knot; benign"   Family History  Problem Relation Age of Onset  . Stroke Mother   . Cancer Mother        ovarian  . Arthritis Father   . Cancer Maternal Aunt        breast  . Cancer Paternal Aunt        breast  . Dementia Brother   . Anesthesia problems Neg Hx    Social History   Socioeconomic History  . Marital status: Married    Spouse name: Not on file  . Number of children: 0  . Years of education: Not on file  . Highest education level: Not on file  Occupational History  . Not on file  Social Needs  . Financial resource strain: Not hard at all  . Food insecurity    Worry: Never true    Inability: Never true  . Transportation needs    Medical: No    Non-medical: No  Tobacco Use  . Smoking status: Never Smoker  . Smokeless tobacco: Never Used  Substance and Sexual Activity  . Alcohol use: No  Alcohol/week: 0.0 standard drinks  . Drug use: No  . Sexual activity: Yes  Lifestyle  . Physical activity    Days per week: Not on file    Minutes per session: Not on file  . Stress: Not at all  Relationships  . Social Herbalist on phone: Not on file    Gets together: Not on file    Attends religious service: Not on file    Active member of club or organization: Not on file    Attends meetings of clubs or organizations: Not on file    Relationship status: Not on file  Other Topics Concern  . Not on file  Social History Narrative  . Not on file    Outpatient Encounter Medications as of 02/12/2019  Medication Sig  . atropine 1 % ophthalmic solution   . Carboxymethylcellulose Sodium (REFRESH LIQUIGEL) 1 % GEL Place 1 drop into both eyes 2 (two) times daily.  . [DISCONTINUED] rosuvastatin (CRESTOR) 5 MG tablet Take 5 mg by mouth daily.  . [DISCONTINUED] triamcinolone cream (KENALOG) 0.1 % Apply 1 application topically 2 (two) times daily. appy to affected area on leg  bid  . [EXPIRED] cyanocobalamin ((VITAMIN B-12)) injection 1,000 mcg    No facility-administered encounter medications on file as of 02/12/2019.     Review of Systems  Constitutional: Negative for appetite change.       Some weight loss as outlined.  Down 3 pounds from last check.    HENT: Negative for congestion and sinus pressure.   Respiratory: Negative for cough, chest tightness and shortness of breath.   Cardiovascular: Negative for chest pain, palpitations and leg swelling.  Gastrointestinal: Negative for abdominal pain, diarrhea, nausea and vomiting.  Genitourinary: Negative for difficulty urinating and dysuria.  Musculoskeletal: Negative for joint swelling and myalgias.  Skin: Negative for color change and rash.  Neurological: Negative for dizziness, light-headedness and headaches.  Psychiatric/Behavioral: Negative for agitation and dysphoric mood.       Objective:    Physical Exam Constitutional:      General: She is not in acute distress.    Appearance: Normal appearance.  HENT:     Right Ear: External ear normal.     Left Ear: External ear normal.  Eyes:     General: No scleral icterus.       Right eye: No discharge.        Left eye: No discharge.     Conjunctiva/sclera: Conjunctivae normal.  Neck:     Musculoskeletal: Neck supple. No muscular tenderness.     Thyroid: No thyromegaly.  Cardiovascular:     Rate and Rhythm: Normal rate and regular rhythm.  Pulmonary:     Effort: No respiratory distress.     Breath sounds: Normal breath sounds. No wheezing.  Abdominal:     General: Bowel sounds are normal.     Palpations: Abdomen is soft.     Tenderness: There is no abdominal tenderness.  Musculoskeletal:        General: No swelling or tenderness.  Lymphadenopathy:     Cervical: No cervical adenopathy.  Skin:    Findings: No erythema or rash.  Neurological:     Mental Status: She is alert.  Psychiatric:        Mood and Affect: Mood normal.         Behavior: Behavior normal.     BP 102/60   Pulse 62   Temp (!) 96.1 F (35.6 C) (Temporal)  Resp 16   Wt 102 lb 3.2 oz (46.4 kg)   SpO2 98%   BMI 18.10 kg/m  Wt Readings from Last 3 Encounters:  02/12/19 102 lb 3.2 oz (46.4 kg)  05/21/18 105 lb (47.6 kg)  01/23/18 99 lb 6.4 oz (45.1 kg)     Lab Results  Component Value Date   WBC 6.5 11/16/2017   HGB 13.3 11/16/2017   HCT 39.9 11/16/2017   PLT 194.0 11/16/2017   GLUCOSE 85 02/11/2019   CHOL 324 (H) 02/11/2019   TRIG 109.0 02/11/2019   HDL 80.80 02/11/2019   LDLDIRECT 174.2 07/09/2013   LDLCALC 221 (H) 02/11/2019   ALT 9 02/11/2019   AST 29 02/11/2019   NA 140 02/11/2019   K 3.9 02/11/2019   CL 105 02/11/2019   CREATININE 1.06 02/11/2019   BUN 22 02/11/2019   CO2 26 02/11/2019   TSH 1.71 11/16/2017        Assessment & Plan:   Problem List Items Addressed This Visit    Abnormal liver function    Liver panel wnl recent check.        Alzheimer disease ([place])    Declines medication.        AMD (age-related macular degeneration), wet ([place])    Followed by ophthalmology.        B12 deficiency - Primary    Low B12 level.  Start B12 injections.        Chest skin lesion    Refer to dermatology.        Relevant Orders   Ambulatory referral to Dermatology   History of breast cancer    Was followed by oncology.  Declines further mammogram testing.        Hypercholesterolemia    Discussed recent cholesterol labs. Discussed starting cholesterol medication.  Her son (and patient) decline to start.            Einar Pheasant, MD

## 2019-02-17 ENCOUNTER — Encounter: Payer: Self-pay | Admitting: Internal Medicine

## 2019-02-17 DIAGNOSIS — F0280 Dementia in other diseases classified elsewhere without behavioral disturbance: Secondary | ICD-10-CM | POA: Insufficient documentation

## 2019-02-17 DIAGNOSIS — G309 Alzheimer's disease, unspecified: Secondary | ICD-10-CM | POA: Insufficient documentation

## 2019-02-17 DIAGNOSIS — L989 Disorder of the skin and subcutaneous tissue, unspecified: Secondary | ICD-10-CM | POA: Insufficient documentation

## 2019-02-17 NOTE — Assessment & Plan Note (Signed)
Liver panel wnl recent check.

## 2019-02-17 NOTE — Assessment & Plan Note (Signed)
Declines medication

## 2019-02-17 NOTE — Assessment & Plan Note (Signed)
Discussed recent cholesterol labs. Discussed starting cholesterol medication.  Her son (and patient) decline to start.

## 2019-02-17 NOTE — Assessment & Plan Note (Signed)
Refer to dermatology 

## 2019-02-17 NOTE — Assessment & Plan Note (Signed)
Followed by ophthalmology.   

## 2019-02-17 NOTE — Assessment & Plan Note (Signed)
Was followed by oncology.  Declines further mammogram testing.

## 2019-02-17 NOTE — Assessment & Plan Note (Signed)
Low B12 level.  Start B12 injections.

## 2019-02-20 ENCOUNTER — Other Ambulatory Visit: Payer: Self-pay

## 2019-02-20 ENCOUNTER — Ambulatory Visit (INDEPENDENT_AMBULATORY_CARE_PROVIDER_SITE_OTHER): Payer: Medicare Other | Admitting: *Deleted

## 2019-02-20 DIAGNOSIS — E538 Deficiency of other specified B group vitamins: Secondary | ICD-10-CM | POA: Diagnosis not present

## 2019-02-20 MED ORDER — CYANOCOBALAMIN 1000 MCG/ML IJ SOLN
1000.0000 ug | Freq: Once | INTRAMUSCULAR | Status: AC
  Administered 2019-02-20: 1000 ug via INTRAMUSCULAR

## 2019-02-20 NOTE — Progress Notes (Addendum)
Patient presented for B 12 injection to right deltoid, patient voiced no concerns nor showed any signs of distress during injection.  Reviewed.  Dr Scott 

## 2019-02-27 ENCOUNTER — Other Ambulatory Visit: Payer: Self-pay

## 2019-02-27 ENCOUNTER — Ambulatory Visit (INDEPENDENT_AMBULATORY_CARE_PROVIDER_SITE_OTHER): Payer: Medicare Other | Admitting: *Deleted

## 2019-02-27 DIAGNOSIS — E538 Deficiency of other specified B group vitamins: Secondary | ICD-10-CM | POA: Diagnosis not present

## 2019-02-27 MED ORDER — CYANOCOBALAMIN 1000 MCG/ML IJ SOLN
1000.0000 ug | Freq: Once | INTRAMUSCULAR | Status: AC
  Administered 2019-02-27: 1000 ug via INTRAMUSCULAR

## 2019-02-27 NOTE — Progress Notes (Addendum)
Patient presented for B 12 injection to left deltoid, patient voiced no concerns nor showed any signs of distress during injection.  Reviewed.  Dr Scott 

## 2019-03-06 ENCOUNTER — Other Ambulatory Visit: Payer: Self-pay

## 2019-03-06 ENCOUNTER — Ambulatory Visit (INDEPENDENT_AMBULATORY_CARE_PROVIDER_SITE_OTHER): Payer: Medicare Other | Admitting: *Deleted

## 2019-03-06 DIAGNOSIS — E538 Deficiency of other specified B group vitamins: Secondary | ICD-10-CM | POA: Diagnosis not present

## 2019-03-06 MED ORDER — CYANOCOBALAMIN 1000 MCG/ML IJ SOLN
1000.0000 ug | Freq: Once | INTRAMUSCULAR | Status: AC
  Administered 2019-03-06: 12:00:00 1000 ug via INTRAMUSCULAR

## 2019-03-06 NOTE — Progress Notes (Addendum)
Patient presented for B 12 injection to right deltoid, patient voiced no concerns nor showed any signs of distress during injection.  Reviewed.  Dr Scott 

## 2019-03-15 DIAGNOSIS — L820 Inflamed seborrheic keratosis: Secondary | ICD-10-CM | POA: Diagnosis not present

## 2019-03-15 DIAGNOSIS — D485 Neoplasm of uncertain behavior of skin: Secondary | ICD-10-CM | POA: Diagnosis not present

## 2019-04-08 ENCOUNTER — Other Ambulatory Visit: Payer: Self-pay

## 2019-04-08 ENCOUNTER — Ambulatory Visit (INDEPENDENT_AMBULATORY_CARE_PROVIDER_SITE_OTHER): Payer: Medicare Other

## 2019-04-08 DIAGNOSIS — E538 Deficiency of other specified B group vitamins: Secondary | ICD-10-CM

## 2019-04-08 MED ORDER — CYANOCOBALAMIN 1000 MCG/ML IJ SOLN
1000.0000 ug | Freq: Once | INTRAMUSCULAR | Status: AC
  Administered 2019-04-08: 09:00:00 1000 ug via INTRAMUSCULAR

## 2019-04-08 NOTE — Progress Notes (Addendum)
Patient presented for B 12 injection to left deltoid, patient voiced no concerns nor showed any signs of distress during injection.  Reviewed.  Dr Scott 

## 2019-05-13 ENCOUNTER — Ambulatory Visit (INDEPENDENT_AMBULATORY_CARE_PROVIDER_SITE_OTHER): Payer: Medicare Other

## 2019-05-13 ENCOUNTER — Other Ambulatory Visit: Payer: Self-pay

## 2019-05-13 DIAGNOSIS — E538 Deficiency of other specified B group vitamins: Secondary | ICD-10-CM

## 2019-05-13 MED ORDER — CYANOCOBALAMIN 1000 MCG/ML IJ SOLN
1000.0000 ug | Freq: Once | INTRAMUSCULAR | Status: AC
  Administered 2019-05-13: 09:00:00 1000 ug via INTRAMUSCULAR

## 2019-05-13 NOTE — Progress Notes (Addendum)
Patient presented today for B12 injection.  Administered IM in the right deltoid.  Patient tolerated well with no signs of distress.  Reviewed.  Dr Scott  

## 2019-06-09 ENCOUNTER — Emergency Department: Payer: Medicare Other

## 2019-06-09 ENCOUNTER — Emergency Department
Admission: EM | Admit: 2019-06-09 | Discharge: 2019-06-10 | Disposition: A | Discharge status: Home / Self Care | Payer: Medicare Other | Attending: Student | Admitting: Student

## 2019-06-09 ENCOUNTER — Encounter: Payer: Self-pay | Admitting: Emergency Medicine

## 2019-06-09 ENCOUNTER — Other Ambulatory Visit: Payer: Self-pay

## 2019-06-09 DIAGNOSIS — F0280 Dementia in other diseases classified elsewhere without behavioral disturbance: Secondary | ICD-10-CM | POA: Insufficient documentation

## 2019-06-09 DIAGNOSIS — N390 Urinary tract infection, site not specified: Secondary | ICD-10-CM | POA: Insufficient documentation

## 2019-06-09 DIAGNOSIS — Z79899 Other long term (current) drug therapy: Secondary | ICD-10-CM | POA: Diagnosis not present

## 2019-06-09 DIAGNOSIS — G309 Alzheimer's disease, unspecified: Secondary | ICD-10-CM | POA: Diagnosis not present

## 2019-06-09 DIAGNOSIS — Z8659 Personal history of other mental and behavioral disorders: Secondary | ICD-10-CM

## 2019-06-09 DIAGNOSIS — J439 Emphysema, unspecified: Secondary | ICD-10-CM | POA: Diagnosis not present

## 2019-06-09 DIAGNOSIS — R4182 Altered mental status, unspecified: Secondary | ICD-10-CM | POA: Diagnosis not present

## 2019-06-09 LAB — CBC
HCT: 39.2 % (ref 36.0–46.0)
Hemoglobin: 13.9 g/dL (ref 12.0–15.0)
MCH: 29.4 pg (ref 26.0–34.0)
MCHC: 35.5 g/dL (ref 30.0–36.0)
MCV: 83.1 fL (ref 80.0–100.0)
Platelets: 240 10*3/uL (ref 150–400)
RBC: 4.72 MIL/uL (ref 3.87–5.11)
RDW: 12.7 % (ref 11.5–15.5)
WBC: 10 10*3/uL (ref 4.0–10.5)
nRBC: 0 % (ref 0.0–0.2)

## 2019-06-09 LAB — COMPREHENSIVE METABOLIC PANEL
ALT: 15 U/L (ref 0–44)
AST: 40 U/L (ref 15–41)
Albumin: 3.7 g/dL (ref 3.5–5.0)
Alkaline Phosphatase: 60 U/L (ref 38–126)
Anion gap: 15 (ref 5–15)
BUN: 21 mg/dL (ref 8–23)
CO2: 21 mmol/L — ABNORMAL LOW (ref 22–32)
Calcium: 9.4 mg/dL (ref 8.9–10.3)
Chloride: 104 mmol/L (ref 98–111)
Creatinine, Ser: 1.02 mg/dL — ABNORMAL HIGH (ref 0.44–1.00)
GFR calc Af Amer: 58 mL/min — ABNORMAL LOW (ref >60)
GFR calc non Af Amer: 50 mL/min — ABNORMAL LOW (ref >60)
Glucose, Bld: 108 mg/dL — ABNORMAL HIGH (ref 70–99)
Potassium: 3.2 mmol/L — ABNORMAL LOW (ref 3.5–5.1)
Sodium: 140 mmol/L (ref 135–145)
Total Bilirubin: 0.9 mg/dL (ref 0.3–1.2)
Total Protein: 6.7 g/dL (ref 6.5–8.1)

## 2019-06-09 LAB — URINALYSIS, COMPLETE (UACMP) WITH MICROSCOPIC
Bilirubin Urine: NEGATIVE
Glucose, UA: NEGATIVE mg/dL
Hgb urine dipstick: NEGATIVE
Ketones, ur: 5 mg/dL — AB
Leukocytes,Ua: NEGATIVE
Nitrite: POSITIVE — AB
Protein, ur: NEGATIVE mg/dL
Specific Gravity, Urine: 1.014 (ref 1.005–1.030)
Squamous Epithelial / HPF: NONE SEEN (ref 0–5)
pH: 8 (ref 5.0–8.0)

## 2019-06-09 NOTE — ED Triage Notes (Signed)
EMS pt to rm 23 from home with report of pt not focusing as she normally does tonight. Pt has dementia and is otherwise at her baseline per EMS. Pt is non verbal at this time.

## 2019-06-10 ENCOUNTER — Encounter: Payer: Self-pay | Admitting: Internal Medicine

## 2019-06-10 ENCOUNTER — Ambulatory Visit (INDEPENDENT_AMBULATORY_CARE_PROVIDER_SITE_OTHER): Payer: Medicare Other | Admitting: Internal Medicine

## 2019-06-10 ENCOUNTER — Other Ambulatory Visit: Payer: Self-pay

## 2019-06-10 DIAGNOSIS — F03911 Unspecified dementia, unspecified severity, with agitation: Secondary | ICD-10-CM

## 2019-06-10 DIAGNOSIS — F0391 Unspecified dementia with behavioral disturbance: Secondary | ICD-10-CM

## 2019-06-10 DIAGNOSIS — N390 Urinary tract infection, site not specified: Secondary | ICD-10-CM | POA: Diagnosis not present

## 2019-06-10 MED ORDER — LORAZEPAM 0.5 MG PO TABS: ORAL_TABLET | ORAL | 1 refills | Status: AC

## 2019-06-10 MED ORDER — POTASSIUM CHLORIDE CRYS ER 20 MEQ PO TBCR: 20.0000 meq | EXTENDED_RELEASE_TABLET | Freq: Every day | ORAL | 0 refills | Status: AC

## 2019-06-10 MED ORDER — FOSFOMYCIN TROMETHAMINE 3 G PO PACK
3.0000 g | PACK | Freq: Once | ORAL | Status: AC
  Administered 2019-06-10: 3 g via ORAL
  Filled 2019-06-10: qty 3

## 2019-06-10 NOTE — ED Notes (Signed)
Son to room and updated on plan of care.

## 2019-06-10 NOTE — Assessment & Plan Note (Signed)
Attribute to possible UTI,  treated in ER with fosfomycin .  Marland Kitchen  Agitation has persisted and UA is not compelling for infection but will await results of culture.  Adding lorazepam 0.5 mg for management of symptoms

## 2019-06-10 NOTE — ED Provider Notes (Signed)
[facility] Emergency Department Provider Note  ____________________________________________   First MD Initiated Contact with Patient 06/09/19 2232     (approximate)  I have reviewed the triage vital signs and the nursing notes.  History  Chief Complaint Altered Mental Status    HPI Cassidy Martinez is a 85 y.o. female with hx of dementia who presents to the ED for "not focusing". Patient resides with her husband, who also has dementia, but was concerned that the patient was not "focusing" like she normally does.   Patient herself has no acute complaints.   Caveat: hx significantly limited due to patient's dementia.    Past Medical Hx Past Medical History:  Diagnosis Date  . Arthritis    hands  . Breast CA ([place])    left;; S/P mastectomy 08/12/11  . Carpal tunnel syndrome, bilateral   . Heart murmur   . History of UTI    "have had a few over the years; haven't had once since ~ 2007"  . HOH (hard of hearing)   . Hyperlipidemia   . Migraines    "used to have them severe; stopped when I took self off estrogen"  . Skin cancer     Problem List Patient Active Problem List   Diagnosis Date Noted  . Alzheimer disease ([place]) 02/17/2019  . Chest skin lesion 02/17/2019  . Acute pain of left knee 01/04/2018  . B12 deficiency 05/11/2017  . Memory loss 02/28/2017  . Pre-op evaluation 02/28/2017  . Elevated blood pressure reading 02/28/2017  . Low back pain 11/04/2016  . Confusion 10/16/2016  . Neck nodule 06/01/2016  . Rash 08/24/2015  . Stress 08/24/2015  . Sinusitis 05/04/2015  . Osteoporosis, post-menopausal 11/04/2014  . Health care maintenance 07/17/2014  . Neck fullness 01/12/2014  . AMD (age-related macular degeneration), wet ([place]) 10/23/2013  . Nonexudative age-related macular degeneration 09/18/2013  . Degenerative drusen 09/18/2013  . History of retinal tear 09/18/2013  . Posterior vitreous detachment 09/18/2013  . Age related cataract  09/18/2013  . Osteoporosis 07/08/2012  . Hypercholesterolemia 07/08/2012  . Abnormal liver function 07/08/2012  . History of breast cancer 08/30/2011    Past Surgical Hx Past Surgical History:  Procedure Laterality Date  . ABDOMINAL HYSTERECTOMY  ~ 1983  . APPENDECTOMY    . BREAST LUMPECTOMY  03/2009   "abnormal cells"; right  . CARPAL TUNNEL RELEASE     right; "I've had it operated on twice"  . CATARACT EXTRACTION W/PHACO Left 03/02/2017   Procedure: CATARACT EXTRACTION PHACO AND INTRAOCULAR LENS PLACEMENT (IOC);  Surgeon: Eulogio Bear, MD;  Location: [facility] ORS;  Service: Ophthalmology;  Laterality: Left;  Lot # [id number] H US:01:02.5 AP%: 13.6 CDE: 8.51  . EYE SURGERY  2006   repair of tear in retina  . MASTECTOMY COMPLETE / SIMPLE W/ SENTINEL NODE BIOPSY  08/12/11   axillary SNB; left  . MASTECTOMY W/ SENTINEL NODE BIOPSY  08/12/2011   Procedure: MASTECTOMY WITH SENTINEL LYMPH NODE BIOPSY;  Surgeon: Adin Hector, MD;  Location: [place];  Service: General;  Laterality: Left;  . NASAL SINUS SURGERY    . SCALP MASS REMOVAL  2007   "knot; benign"    Medications Prior to Admission medications   Medication Sig Start Date End Date Taking? Authorizing Provider  atropine 1 % ophthalmic solution  07/23/18   [provider]  Carboxymethylcellulose Sodium (REFRESH LIQUIGEL) 1 % GEL Place 1 drop into both eyes 2 (two) times daily.    [provider]    Allergies Sulfa antibiotics, Macrobid [nitrofurantoin macrocrystal], Other, and Penicillins  Family Hx Family History  Problem Relation Age of Onset  . Stroke Mother   . Cancer Mother        ovarian  . Arthritis Father   . Cancer Maternal Aunt        breast  . Cancer Paternal Aunt        breast  . Dementia Brother   . Anesthesia problems Neg Hx     Social Hx Social History   Tobacco Use  . Smoking status: Never Smoker  . Smokeless tobacco: Never Used  Substance Use Topics  . Alcohol use: No     Alcohol/week: 0.0 standard drinks  . Drug use: No     Review of Systems Unable to obtain due to patient's hx of dementia   Physical Exam  Vital Signs: ED Triage Vitals  Enc Vitals Group     BP 06/09/19 2232 (!) 185/64     Pulse Rate 06/09/19 2232 66     Resp 06/09/19 2232 (!) 22     Temp 06/09/19 2232 97.7 F (36.5 C)     Temp Source 06/09/19 2232 Oral     SpO2 06/09/19 2232 100 %     Weight 06/09/19 2225 100 lb (45.4 kg)     Height 06/09/19 2225 5\' 4"  (1.626 m)     Head Circumference --      Peak Flow --      Pain Score --      Pain Loc --      Pain Edu? --      Excl. in [place]? --     Constitutional: Awake and alert. Oriented to self, otherwise pleasantly demented.  Head: Normocephalic. Atraumatic. Eyes: Conjunctivae clear. Sclera anicteric. Nose: No congestion. No rhinorrhea. Mouth/Throat: Wearing mask.  Neck: No stridor.   Cardiovascular: Normal rate, regular rhythm. Extremities well perfused. Respiratory: Normal respiratory effort.  Lungs CTAB. Gastrointestinal: Soft. Non-tender. Non-distended.  Musculoskeletal: No lower extremity edema. No deformities. Neurologic:  Oriented to self only. Pleasantly demented. Moves all extremities.  Skin: Skin is warm, dry and intact. No rash noted. Psychiatric: Mood and affect are appropriate for situation.  EKG  Personally reviewed.   Rate: 61 Rhythm: sinus Axis: normal Intervals: WNL No STEMI    Radiology  CT head: IMPRESSION:  Age indeterminate, likely subacute or chronic infarct involving the  right paramedian occipital lobe.   Findings consistent with age related atrophy and chronic small  vessel ischemia   CXR: IMPRESSION:  1. No active disease.  2. Emphysema.    Procedures  Procedure(s) performed (including critical care):  Procedures   Initial Impression / Assessment and Plan / ED Course  85 y.o. female who presents to the ED for AMS above her baseline dementia  Ddx: UTI, electrolyte  abnormality, dehydration  Work up concerning for possible UTI, with nitrite positive, bacteria. Will opt to treat with dose of fosfomycin. Otherwise, remainder of labs w/o actionable derangements. No acute findings on CT.   Will plan to treat UTI, otherwise patient stable for discharge. Son comfortable with plan. Advised close outpatient follow up. Given return precautions.    Final Clinical Impression(s) / ED Diagnosis  Final diagnoses:  History of dementia  Urinary tract infection in elderly patient       Note:  This document was prepared using Dragon voice recognition software and may include unintentional dictation errors.   Lilia Pro., MD 06/10/19 0130

## 2019-06-10 NOTE — Progress Notes (Signed)
Increased confusion, agitated, urgency, abdominal pain. Pt's son stated that in the ED last light they gave her a powder packet that was mixed in water and was told that she would not need any other antibiotics. Not sure what it is called.

## 2019-06-10 NOTE — ED Notes (Signed)
Discharge instructions reviewed with son who verbalizes understanding.

## 2019-06-10 NOTE — Discharge Instructions (Addendum)
Thank you for letting us take care of you in the emergency department today. Your studies showed you have a urine infection. We treated you with antibiotics here.   Please continue to take any regular, prescribed medications.   Please follow up with: - Your primary care doctor to review your ER visit and follow up on your symptoms.   Please return to the ER for any new or worsening symptoms.

## 2019-06-10 NOTE — Progress Notes (Signed)
Telephone  Note  This visit type was conducted due to national recommendations for restrictions regarding the COVID-19 pandemic (e.g. social distancing).  This format is felt to be most appropriate for this patient at this time.  All issues noted in this document were discussed and addressed.  No physical exam was performed (except for noted visual exam findings with Video Visits).   I connected with@ on 06/10/19 at 11:00 AM EST by  telephone and verified that I am speaking with the correct person using two identifiers. Location patient: home Location provider: work or home office Persons participating in the virtual visit: patient, provider and her Braxton Feathers   I discussed the limitations, risks, security and privacy concerns of performing an evaluation and management service by telephone and the availability of in person appointments. I also discussed with the patient that there may be a patient responsible charge related to this service. The patient expressed understanding and agreed to proceed.  Reason for visit: agitation  HPI:  85 yr old female with progressive Alzheimer dementia with subacute onset of agitation and confusion  (has been agitated and confused since Dec 5)  Taken to ER yesterday for symptoms of pain,  And nonverbal demeanor  (acute changes: moaning,  Nonverbal )  that started yesterday evening after being at her baseline at lunch. Son describes her as being "out of it" (disoriented, nonverbal ,  very agitated  CT head  and chest x ray neg for acute changes. Urine obtained via in and out cath, traumatic experience for patient.   UA positive for nitrates scant bacteria NO WBCS   Treated for UTI with fosfomycin,.  She has been more incontinent since then.   Has allergies to macrobid, pcn,  And sulfa   Has non disturbing visual hallucinations vs confusing reality with self image seen  in mirror     ROS: See pertinent positives and negatives per HPI.  Past Medical History:   Diagnosis Date  . Arthritis    hands  . Breast CA ([place])    left;; S/P mastectomy 08/12/11  . Carpal tunnel syndrome, bilateral   . Heart murmur   . History of UTI    "have had a few over the years; haven't had once since ~ 2007"  . HOH (hard of hearing)   . Hyperlipidemia   . Migraines    "used to have them severe; stopped when I took self off estrogen"  . Skin cancer     Past Surgical History:  Procedure Laterality Date  . ABDOMINAL HYSTERECTOMY  ~ 1983  . APPENDECTOMY    . BREAST LUMPECTOMY  03/2009   "abnormal cells"; right  . CARPAL TUNNEL RELEASE     right; "I've had it operated on twice"  . CATARACT EXTRACTION W/PHACO Left 03/02/2017   Procedure: CATARACT EXTRACTION PHACO AND INTRAOCULAR LENS PLACEMENT (IOC);  Surgeon: Eulogio Bear, MD;  Location: [facility] ORS;  Service: Ophthalmology;  Laterality: Left;  Lot # [id number] H US:01:02.5 AP%: 13.6 CDE: 8.51  . EYE SURGERY  2006   repair of tear in retina  . MASTECTOMY COMPLETE / SIMPLE W/ SENTINEL NODE BIOPSY  08/12/11   axillary SNB; left  . MASTECTOMY W/ SENTINEL NODE BIOPSY  08/12/2011   Procedure: MASTECTOMY WITH SENTINEL LYMPH NODE BIOPSY;  Surgeon: Adin Hector, MD;  Location: [place];  Service: General;  Laterality: Left;  . NASAL SINUS SURGERY    . SCALP MASS REMOVAL  2007   "knot; benign"  Family History  Problem Relation Age of Onset  . Stroke Mother   . Cancer Mother        ovarian  . Arthritis Father   . Cancer Maternal Aunt        breast  . Cancer Paternal Aunt        breast  . Dementia Brother   . Anesthesia problems Neg Hx     SOCIAL HX:   reports that she has never smoked. She has never used smokeless tobacco. She reports that she does not drink alcohol or use drugs.   Current Outpatient Medications:  .  atropine 1 % ophthalmic solution, , Disp: , Rfl:  .  Carboxymethylcellulose Sodium (REFRESH LIQUIGEL) 1 % GEL, Place 1 drop into both eyes 2 (two) times daily., Disp: , Rfl:  .  prednisoLONE  acetate (PRED FORTE) 1 % ophthalmic suspension, Place 1 drop into the left eye daily., Disp: , Rfl:  .  LORazepam (ATIVAN) 0.5 MG tablet, 1/2 to 1 tablet every 12 hours if needed for agitation, Disp: 30 tablet, Rfl: 1 .  potassium chloride SA (KLOR-CON) 20 MEQ tablet, Take 1 tablet (20 mEq total) by mouth daily., Disp: 10 tablet, Rfl: 0  EXAM:  .Marland KitchenGeneral impression: anxious, agitated No signs of being in distress  Lungs: speech is nonberbal today  Does not appear to be short of breath and not punctuated by cough, sneezing or sniffing. Marland Kitchen   Psych: affect normal.  speech is articulate and non pressured .  Denies suicidal thoughts   ASSESSMENT AND PLAN:  Discussed the following assessment and plan:  Agitation due to dementia [facility])  Agitation due to dementia [facility]) Attribute to possible UTI,  treated in ER with fosfomycin .  Marland Kitchen  Agitation has persisted and UA is not compelling for infection but will await results of culture.  Adding lorazepam 0.5 mg for management of symptoms     I discussed the assessment and treatment plan with the patient. The patient was provided an opportunity to ask questions and all were answered. The patient agreed with the plan and demonstrated an understanding of the instructions.   The patient was advised to call back or seek an in-person evaluation if the symptoms worsen or if the condition fails to improve as anticipated.  I provided  25 minutes of non-face-to-face time during this encounter reviewing patient's current problems and past procedures/imaging studies, providing counseling on the above mentioned problems , and coordination  of care .  Crecencio Mc, MD

## 2019-06-12 LAB — URINE CULTURE: Culture: >=100000 — AB

## 2019-06-19 ENCOUNTER — Ambulatory Visit (INDEPENDENT_AMBULATORY_CARE_PROVIDER_SITE_OTHER): Payer: Medicare Other | Admitting: Internal Medicine

## 2019-06-19 ENCOUNTER — Encounter: Payer: Self-pay | Admitting: Internal Medicine

## 2019-06-19 ENCOUNTER — Other Ambulatory Visit: Payer: Self-pay

## 2019-06-19 DIAGNOSIS — F0391 Unspecified dementia with behavioral disturbance: Secondary | ICD-10-CM

## 2019-06-19 DIAGNOSIS — G301 Alzheimer's disease with late onset: Secondary | ICD-10-CM

## 2019-06-19 DIAGNOSIS — R945 Abnormal results of liver function studies: Secondary | ICD-10-CM

## 2019-06-19 DIAGNOSIS — R4182 Altered mental status, unspecified: Secondary | ICD-10-CM

## 2019-06-19 DIAGNOSIS — E538 Deficiency of other specified B group vitamins: Secondary | ICD-10-CM | POA: Diagnosis not present

## 2019-06-19 DIAGNOSIS — F0280 Dementia in other diseases classified elsewhere without behavioral disturbance: Secondary | ICD-10-CM | POA: Diagnosis not present

## 2019-06-19 DIAGNOSIS — H353290 Exudative age-related macular degeneration, unspecified eye, stage unspecified: Secondary | ICD-10-CM | POA: Diagnosis not present

## 2019-06-19 DIAGNOSIS — F03911 Unspecified dementia, unspecified severity, with agitation: Secondary | ICD-10-CM

## 2019-06-19 NOTE — Progress Notes (Signed)
Patient ID: Cassidy Martinez, female   DOB: [birth year], 86 y.o.   MRN: [id number]   Virtual Visit via telephone Note  This visit type was conducted due to national recommendations for restrictions regarding the COVID-19 pandemic (e.g. social distancing).  This format is felt to be most appropriate for this patient at this time.  All issues noted in this document were discussed and addressed.  No physical exam was performed (except for noted visual exam findings with Video Visits).   I connected with [name] by telephone and verified that I am speaking with the correct person using two identifiers. Location patient: home Location provider: work  Persons participating in the virtual visit: provider and patient's son Jeneen Rinks)  The limitations, risks, security and privacy concerns of performing an evaluation and management service by telephone and the availability of in person appointments have been discussed.  The patient expressed understanding and agreed to proceed.   Reason for visit: work in appt.   HPI: Was seen in ER 06/09/19 with mental status changes.  She has a history of dementia.  On 05/18/19, noticed acute change in symptoms.  Became more nonverbal.  Not communicating and when does respond, does not make sense. Is now needing assistance to walk.  Will occasionally just get up on her own and walk to the bathroom.  Son reports then she will sit in the bathroom until someone can come get her.  Continued to gradually decline from 05/18/19 - 06/09/19.  On 06/09/19 - symptoms worsened.  Was moaning in pain, more confused.  To ER.  CXR negative.  Lab work unrevealing.  CT head, revealed likely subacute or chronic infarct involving the right paramedian occipital lobe.  Other findings c/w age related atrophy and chronic small vessel ischemia.  Was treated for UTI with fosfamycin.  Discharged home.  Saw Dr Derrel Nip on 06/10/19 - more agitation.  Note reviewed. Was placed on lorazepam.  Taking 1/2 tablet  bid.  Helping with agitation.  Son reports she is able to swallow.  Is eating now.  Eating varies.  Has to be encouraged to eat.  When walks, she appears to pull to the right.  Is blind in left eye (not acute).  Appears to be in no pain now.  Some generalized weakness.  Moves all extremities. When responds back - talks non sense.  Does report visual hallucinations - two women in house. Son was questioning if she is referring to his wife and her reflection in mirror.     ROS: See pertinent positives and negatives per HPI.  Past Medical History:  Diagnosis Date  . Arthritis    hands  . Breast CA ([place])    left;; S/P mastectomy 08/12/11  . Carpal tunnel syndrome, bilateral   . Heart murmur   . History of UTI    "have had a few over the years; haven't had once since ~ 2007"  . HOH (hard of hearing)   . Hyperlipidemia   . Migraines    "used to have them severe; stopped when I took self off estrogen"  . Skin cancer     Past Surgical History:  Procedure Laterality Date  . ABDOMINAL HYSTERECTOMY  ~ 1983  . APPENDECTOMY    . BREAST LUMPECTOMY  03/2009   "abnormal cells"; right  . CARPAL TUNNEL RELEASE     right; "I've had it operated on twice"  . CATARACT EXTRACTION W/PHACO Left 03/02/2017   Procedure: CATARACT EXTRACTION PHACO AND INTRAOCULAR LENS PLACEMENT (IOC);  Surgeon: Eulogio Bear, MD;  Location: [facility];  Service: Ophthalmology;  Laterality: Left;  Lot # [id number] H US:01:02.5 AP%: 13.6 CDE: 8.51  . EYE SURGERY  2006   repair of tear in retina  . MASTECTOMY COMPLETE / SIMPLE W/ SENTINEL NODE BIOPSY  08/12/11   axillary SNB; left  . MASTECTOMY W/ SENTINEL NODE BIOPSY  08/12/2011   Procedure: MASTECTOMY WITH SENTINEL LYMPH NODE BIOPSY;  Surgeon: Adin Hector, MD;  Location: [place];  Service: General;  Laterality: Left;  . NASAL SINUS SURGERY    . SCALP MASS REMOVAL  2007   "knot; benign"    Family History  Problem Relation Age of Onset  . Stroke Mother   . Cancer Mother          ovarian  . Arthritis Father   . Cancer Maternal Aunt        breast  . Cancer Paternal Aunt        breast  . Dementia Brother   . Anesthesia problems Neg Hx     SOCIAL HX: reviewed.    Current Outpatient Medications:  .  atropine 1 % ophthalmic solution, , Disp: , Rfl:  .  Carboxymethylcellulose Sodium (REFRESH LIQUIGEL) 1 % GEL, Place 1 drop into both eyes 2 (two) times daily., Disp: , Rfl:  .  LORazepam (ATIVAN) 0.5 MG tablet, 1/2 to 1 tablet every 12 hours if needed for agitation, Disp: 30 tablet, Rfl: 1 .  potassium chloride SA (KLOR-CON) 20 MEQ tablet, Take 1 tablet (20 mEq total) by mouth daily., Disp: 10 tablet, Rfl: 0 .  prednisoLONE acetate (PRED FORTE) 1 % ophthalmic suspension, Place 1 drop into the left eye daily., Disp: , Rfl:   EXAM:  GENERAL: confused.  Eating currently.    PSYCH/NEURO: son reports - able to move all extremities.  Confused.   ASSESSMENT AND PLAN:  Discussed the following assessment and plan:  Change in mental status Has a history of dementia.  Notice of acute worsening 05/18/19.  Continued gradual decline. Change 06/09/19 - moaning, etc.  ER evaluation as outlined.  Treated for UTI.  CT with subacute or chronic infarct involving right paramedian occipital lobe.  Continues to be confused.  No pain.  Needing assistance with walking.  Agitation improved with lorazepam.  Discussed further w/up, including return to hospital given continued decline.  Also discussed MRI and further testing to try and better determine etiology of acute change on 05/18/19.  Son expressed desire to hold on further testing and evaluation (at hospital) at this time.  Desires to keep her home, but was questioning evaluation in home.  Discussed repeat labs and urine to confirm infection cleared. Discussed the possibility of home health, [facility] , etc.  He is interested in pursuing.    Abnormal liver function Liver function in ER wnl.   Agitation due to dementia  [facility]) Treated in ER for UTI.  Need to confirm clear.  On lorazepam now.  Helping with agitation.    Alzheimer disease [facility]) Has been evaluated by neurology previously.  I have previously discussed medication.  Has continued to decline.    AMD (age-related macular degeneration), wet ([place]) Blind in left eye.  Followed by ophthalmology.    B12 deficiency Has been receiving B12 injections.     I discussed the assessment and treatment plan with the patient. The patient was provided an opportunity to ask questions and all were answered. The patient agreed with the plan and demonstrated an understanding of  the instructions.   The patient was advised to call back or seek an in-person evaluation if the symptoms worsen or if the condition fails to improve as anticipated.  I provided 25 minutes of non-face-to-face time during this encounter.   Einar Pheasant, MD

## 2019-06-20 ENCOUNTER — Telehealth: Payer: Self-pay

## 2019-06-20 ENCOUNTER — Encounter: Payer: Self-pay | Admitting: Internal Medicine

## 2019-06-20 DIAGNOSIS — R4182 Altered mental status, unspecified: Secondary | ICD-10-CM

## 2019-06-20 DIAGNOSIS — F03911 Unspecified dementia, unspecified severity, with agitation: Secondary | ICD-10-CM

## 2019-06-20 DIAGNOSIS — F0391 Unspecified dementia with behavioral disturbance: Secondary | ICD-10-CM

## 2019-06-20 DIAGNOSIS — F0280 Dementia in other diseases classified elsewhere without behavioral disturbance: Secondary | ICD-10-CM

## 2019-06-20 NOTE — Assessment & Plan Note (Signed)
Blind in left eye.  Followed by ophthalmology.

## 2019-06-20 NOTE — Assessment & Plan Note (Signed)
Has been receiving B12 injections.

## 2019-06-20 NOTE — Assessment & Plan Note (Signed)
Has been evaluated by neurology previously.  I have previously discussed medication.  Has continued to decline.

## 2019-06-20 NOTE — Assessment & Plan Note (Signed)
Liver function in ER wnl.

## 2019-06-20 NOTE — Telephone Encounter (Signed)
Spoke with patients son, advised that [facility] is not honored by her insurance. Son is agreeable with hospice referral and would like to proceed. Advised that I will contact with any updates.

## 2019-06-20 NOTE — Telephone Encounter (Signed)
Order placed for hospice referral.  

## 2019-06-20 NOTE — Assessment & Plan Note (Signed)
Has a history of dementia.  Notice of acute worsening 05/18/19.  Continued gradual decline. Change 06/09/19 - moaning, etc.  ER evaluation as outlined.  Treated for UTI.  CT with subacute or chronic infarct involving right paramedian occipital lobe.  Continues to be confused.  No pain.  Needing assistance with walking.  Agitation improved with lorazepam.  Discussed further w/up, including return to hospital given continued decline.  Also discussed MRI and further testing to try and better determine etiology of acute change on 05/18/19.  Son expressed desire to hold on further testing and evaluation (at hospital) at this time.  Desires to keep her home, but was questioning evaluation in home.  Discussed repeat labs and urine to confirm infection cleared. Discussed the possibility of home health, [facility] , etc.  He is interested in pursuing.

## 2019-06-20 NOTE — Assessment & Plan Note (Signed)
Treated in ER for UTI.  Need to confirm clear.  On lorazepam now.  Helping with agitation.

## 2019-06-23 DIAGNOSIS — Z853 Personal history of malignant neoplasm of breast: Secondary | ICD-10-CM | POA: Diagnosis not present

## 2019-06-23 DIAGNOSIS — G9340 Encephalopathy, unspecified: Secondary | ICD-10-CM | POA: Diagnosis not present

## 2019-06-23 DIAGNOSIS — F0280 Dementia in other diseases classified elsewhere without behavioral disturbance: Secondary | ICD-10-CM | POA: Diagnosis not present

## 2019-06-23 DIAGNOSIS — G309 Alzheimer's disease, unspecified: Secondary | ICD-10-CM | POA: Diagnosis not present

## 2019-06-23 DIAGNOSIS — E785 Hyperlipidemia, unspecified: Secondary | ICD-10-CM | POA: Diagnosis not present

## 2019-06-23 DIAGNOSIS — E46 Unspecified protein-calorie malnutrition: Secondary | ICD-10-CM | POA: Diagnosis not present

## 2019-06-23 DIAGNOSIS — H409 Unspecified glaucoma: Secondary | ICD-10-CM | POA: Diagnosis not present

## 2019-06-23 DIAGNOSIS — Z87440 Personal history of urinary (tract) infections: Secondary | ICD-10-CM | POA: Diagnosis not present

## 2019-06-23 DIAGNOSIS — H3530 Unspecified macular degeneration: Secondary | ICD-10-CM | POA: Diagnosis not present

## 2019-06-24 DIAGNOSIS — E785 Hyperlipidemia, unspecified: Secondary | ICD-10-CM | POA: Diagnosis not present

## 2019-06-24 DIAGNOSIS — Z87440 Personal history of urinary (tract) infections: Secondary | ICD-10-CM | POA: Diagnosis not present

## 2019-06-24 DIAGNOSIS — E46 Unspecified protein-calorie malnutrition: Secondary | ICD-10-CM | POA: Diagnosis not present

## 2019-06-24 DIAGNOSIS — G9340 Encephalopathy, unspecified: Secondary | ICD-10-CM | POA: Diagnosis not present

## 2019-06-24 DIAGNOSIS — F0280 Dementia in other diseases classified elsewhere without behavioral disturbance: Secondary | ICD-10-CM | POA: Diagnosis not present

## 2019-06-24 DIAGNOSIS — G309 Alzheimer's disease, unspecified: Secondary | ICD-10-CM | POA: Diagnosis not present

## 2019-06-25 DIAGNOSIS — G309 Alzheimer's disease, unspecified: Secondary | ICD-10-CM | POA: Diagnosis not present

## 2019-06-25 DIAGNOSIS — F0280 Dementia in other diseases classified elsewhere without behavioral disturbance: Secondary | ICD-10-CM | POA: Diagnosis not present

## 2019-06-25 DIAGNOSIS — Z87440 Personal history of urinary (tract) infections: Secondary | ICD-10-CM | POA: Diagnosis not present

## 2019-06-25 DIAGNOSIS — E785 Hyperlipidemia, unspecified: Secondary | ICD-10-CM | POA: Diagnosis not present

## 2019-06-25 DIAGNOSIS — E46 Unspecified protein-calorie malnutrition: Secondary | ICD-10-CM | POA: Diagnosis not present

## 2019-06-25 DIAGNOSIS — G9340 Encephalopathy, unspecified: Secondary | ICD-10-CM | POA: Diagnosis not present

## 2019-06-26 DIAGNOSIS — F0280 Dementia in other diseases classified elsewhere without behavioral disturbance: Secondary | ICD-10-CM | POA: Diagnosis not present

## 2019-06-26 DIAGNOSIS — G9340 Encephalopathy, unspecified: Secondary | ICD-10-CM | POA: Diagnosis not present

## 2019-06-26 DIAGNOSIS — E46 Unspecified protein-calorie malnutrition: Secondary | ICD-10-CM | POA: Diagnosis not present

## 2019-06-26 DIAGNOSIS — Z87440 Personal history of urinary (tract) infections: Secondary | ICD-10-CM | POA: Diagnosis not present

## 2019-06-26 DIAGNOSIS — E785 Hyperlipidemia, unspecified: Secondary | ICD-10-CM | POA: Diagnosis not present

## 2019-06-26 DIAGNOSIS — G309 Alzheimer's disease, unspecified: Secondary | ICD-10-CM | POA: Diagnosis not present

## 2019-06-28 DIAGNOSIS — Z87440 Personal history of urinary (tract) infections: Secondary | ICD-10-CM | POA: Diagnosis not present

## 2019-06-28 DIAGNOSIS — F0280 Dementia in other diseases classified elsewhere without behavioral disturbance: Secondary | ICD-10-CM | POA: Diagnosis not present

## 2019-06-28 DIAGNOSIS — G9340 Encephalopathy, unspecified: Secondary | ICD-10-CM | POA: Diagnosis not present

## 2019-06-28 DIAGNOSIS — E46 Unspecified protein-calorie malnutrition: Secondary | ICD-10-CM | POA: Diagnosis not present

## 2019-06-28 DIAGNOSIS — G309 Alzheimer's disease, unspecified: Secondary | ICD-10-CM | POA: Diagnosis not present

## 2019-06-28 DIAGNOSIS — E785 Hyperlipidemia, unspecified: Secondary | ICD-10-CM | POA: Diagnosis not present

## 2019-06-29 DIAGNOSIS — E785 Hyperlipidemia, unspecified: Secondary | ICD-10-CM | POA: Diagnosis not present

## 2019-06-29 DIAGNOSIS — G309 Alzheimer's disease, unspecified: Secondary | ICD-10-CM | POA: Diagnosis not present

## 2019-06-29 DIAGNOSIS — G9340 Encephalopathy, unspecified: Secondary | ICD-10-CM | POA: Diagnosis not present

## 2019-06-29 DIAGNOSIS — F0280 Dementia in other diseases classified elsewhere without behavioral disturbance: Secondary | ICD-10-CM | POA: Diagnosis not present

## 2019-06-29 DIAGNOSIS — E46 Unspecified protein-calorie malnutrition: Secondary | ICD-10-CM | POA: Diagnosis not present

## 2019-06-29 DIAGNOSIS — Z87440 Personal history of urinary (tract) infections: Secondary | ICD-10-CM | POA: Diagnosis not present

## 2019-07-01 DIAGNOSIS — E785 Hyperlipidemia, unspecified: Secondary | ICD-10-CM | POA: Diagnosis not present

## 2019-07-01 DIAGNOSIS — F0280 Dementia in other diseases classified elsewhere without behavioral disturbance: Secondary | ICD-10-CM | POA: Diagnosis not present

## 2019-07-01 DIAGNOSIS — E46 Unspecified protein-calorie malnutrition: Secondary | ICD-10-CM | POA: Diagnosis not present

## 2019-07-01 DIAGNOSIS — G309 Alzheimer's disease, unspecified: Secondary | ICD-10-CM | POA: Diagnosis not present

## 2019-07-01 DIAGNOSIS — Z87440 Personal history of urinary (tract) infections: Secondary | ICD-10-CM | POA: Diagnosis not present

## 2019-07-01 DIAGNOSIS — G9340 Encephalopathy, unspecified: Secondary | ICD-10-CM | POA: Diagnosis not present

## 2019-07-03 DIAGNOSIS — G309 Alzheimer's disease, unspecified: Secondary | ICD-10-CM | POA: Diagnosis not present

## 2019-07-03 DIAGNOSIS — Z87440 Personal history of urinary (tract) infections: Secondary | ICD-10-CM | POA: Diagnosis not present

## 2019-07-03 DIAGNOSIS — F0280 Dementia in other diseases classified elsewhere without behavioral disturbance: Secondary | ICD-10-CM | POA: Diagnosis not present

## 2019-07-03 DIAGNOSIS — G9340 Encephalopathy, unspecified: Secondary | ICD-10-CM | POA: Diagnosis not present

## 2019-07-03 DIAGNOSIS — E46 Unspecified protein-calorie malnutrition: Secondary | ICD-10-CM | POA: Diagnosis not present

## 2019-07-03 DIAGNOSIS — E785 Hyperlipidemia, unspecified: Secondary | ICD-10-CM | POA: Diagnosis not present

## 2019-07-04 DIAGNOSIS — G309 Alzheimer's disease, unspecified: Secondary | ICD-10-CM | POA: Diagnosis not present

## 2019-07-04 DIAGNOSIS — G9340 Encephalopathy, unspecified: Secondary | ICD-10-CM | POA: Diagnosis not present

## 2019-07-04 DIAGNOSIS — E785 Hyperlipidemia, unspecified: Secondary | ICD-10-CM | POA: Diagnosis not present

## 2019-07-04 DIAGNOSIS — Z87440 Personal history of urinary (tract) infections: Secondary | ICD-10-CM | POA: Diagnosis not present

## 2019-07-04 DIAGNOSIS — E46 Unspecified protein-calorie malnutrition: Secondary | ICD-10-CM | POA: Diagnosis not present

## 2019-07-04 DIAGNOSIS — F0280 Dementia in other diseases classified elsewhere without behavioral disturbance: Secondary | ICD-10-CM | POA: Diagnosis not present

## 2019-07-05 ENCOUNTER — Telehealth: Payer: Self-pay | Admitting: Internal Medicine

## 2019-07-05 DIAGNOSIS — E785 Hyperlipidemia, unspecified: Secondary | ICD-10-CM | POA: Diagnosis not present

## 2019-07-05 DIAGNOSIS — F0280 Dementia in other diseases classified elsewhere without behavioral disturbance: Secondary | ICD-10-CM | POA: Diagnosis not present

## 2019-07-05 DIAGNOSIS — G9340 Encephalopathy, unspecified: Secondary | ICD-10-CM | POA: Diagnosis not present

## 2019-07-05 DIAGNOSIS — Z87440 Personal history of urinary (tract) infections: Secondary | ICD-10-CM | POA: Diagnosis not present

## 2019-07-05 DIAGNOSIS — E46 Unspecified protein-calorie malnutrition: Secondary | ICD-10-CM | POA: Diagnosis not present

## 2019-07-05 DIAGNOSIS — G309 Alzheimer's disease, unspecified: Secondary | ICD-10-CM | POA: Diagnosis not present

## 2019-07-05 NOTE — Telephone Encounter (Signed)
Called andrea.  [place] for seroquel 25mg  bid.  She will [facility] and let us know if anything more is needed.

## 2019-07-05 NOTE — Telephone Encounter (Signed)
Are you okay with me giving verbals for seroquel?

## 2019-07-05 NOTE — Telephone Encounter (Signed)
Cassidy Martinez with Hospice states that pt has terminal restlessness and that current medications are not helping. She would like a verbal ok for Seroquel -[facility] [place]. She states that they have attempted Ativan and Haldol. Please advise [phone].

## 2019-07-08 ENCOUNTER — Telehealth: Payer: Self-pay | Admitting: Internal Medicine

## 2019-07-08 ENCOUNTER — Other Ambulatory Visit: Payer: Self-pay

## 2019-07-08 DIAGNOSIS — G309 Alzheimer's disease, unspecified: Secondary | ICD-10-CM | POA: Diagnosis not present

## 2019-07-08 DIAGNOSIS — E46 Unspecified protein-calorie malnutrition: Secondary | ICD-10-CM | POA: Diagnosis not present

## 2019-07-08 DIAGNOSIS — G9340 Encephalopathy, unspecified: Secondary | ICD-10-CM | POA: Diagnosis not present

## 2019-07-08 DIAGNOSIS — F0280 Dementia in other diseases classified elsewhere without behavioral disturbance: Secondary | ICD-10-CM | POA: Diagnosis not present

## 2019-07-08 DIAGNOSIS — E785 Hyperlipidemia, unspecified: Secondary | ICD-10-CM | POA: Diagnosis not present

## 2019-07-08 DIAGNOSIS — Z87440 Personal history of urinary (tract) infections: Secondary | ICD-10-CM | POA: Diagnosis not present

## 2019-07-08 NOTE — Telephone Encounter (Signed)
Mitzi has been notified.

## 2019-07-08 NOTE — Telephone Encounter (Signed)
Tried to call to get more information.  Need to clarify what exactly is needed.  I am not understanding what is being asked.  Need more information.

## 2019-07-08 NOTE — Telephone Encounter (Signed)
Mitzi with [facility] called to state that family wants to try Remeron. 7.5 mg at bedtime.

## 2019-07-08 NOTE — Telephone Encounter (Signed)
Patient and Cassidy Martinez has been notified.

## 2019-07-08 NOTE — Telephone Encounter (Signed)
The medication is keeping patient awake and alert and moving too much, they would like the medication downgraded due to not being able to keep an accurate track of her due to her getting out of bed. Due to the high fall risk they would like the medication changed or a lower dose called in.

## 2019-07-08 NOTE — Telephone Encounter (Signed)
Cassidy Martinez wants to know if it is ok to change her ativan liquid to a higher dose such as .25mg -.50mg  the concentration is 2mg  per ML.

## 2019-07-08 NOTE — Telephone Encounter (Signed)
Can we see how she is going to respond to remeron and then make adjustments from there - since just starting.  Call if problems.

## 2019-07-08 NOTE — Progress Notes (Signed)
Cassidy Martinez wants to know if it is ok to change her ativan liquid to a higher dose such as .25mg -.50mg  the concentration is 2mg  per ML.

## 2019-07-08 NOTE — Telephone Encounter (Signed)
Day one medication worked Engineer, manufacturing. The rest of the days she is alert and appetite is great. Issues were great then she stays awake at night and getting out of bed a lot and giving her more energy. Since she is a high fall risk they do not want her to be getting out of the bed all the time. HH Wondering if they need to decrease it at all please advise. [phone].

## 2019-07-08 NOTE — Telephone Encounter (Signed)
Please notify Mitzi that I received the message.  Will d/c seroquel and start remeron 7.5mg  q hs.  Is she going to send in rx.  If no, let me know and will need to send in.  Also will need to know where to send.  Take seroquel off med list if on.

## 2019-07-08 NOTE — Telephone Encounter (Signed)
Called and spoke to [facility].  Discussed concerns. Family concern that she is till getting up and down at night.  Not sure if seroquel is working.  Currently on 25mg  bid.  Mitzi suggested changing to 50mg  q hs.  Discussed if family ok with this, then ok to try.  If persistent problems or if family not ok with increased dose and night, then trial of remeron 7.5mg  and stop seroquel.  Mitzi to call back.

## 2019-07-09 DIAGNOSIS — G9340 Encephalopathy, unspecified: Secondary | ICD-10-CM | POA: Diagnosis not present

## 2019-07-09 DIAGNOSIS — Z87440 Personal history of urinary (tract) infections: Secondary | ICD-10-CM | POA: Diagnosis not present

## 2019-07-09 DIAGNOSIS — G309 Alzheimer's disease, unspecified: Secondary | ICD-10-CM | POA: Diagnosis not present

## 2019-07-09 DIAGNOSIS — F0280 Dementia in other diseases classified elsewhere without behavioral disturbance: Secondary | ICD-10-CM | POA: Diagnosis not present

## 2019-07-09 DIAGNOSIS — E46 Unspecified protein-calorie malnutrition: Secondary | ICD-10-CM | POA: Diagnosis not present

## 2019-07-09 DIAGNOSIS — E785 Hyperlipidemia, unspecified: Secondary | ICD-10-CM | POA: Diagnosis not present

## 2019-07-10 DIAGNOSIS — G309 Alzheimer's disease, unspecified: Secondary | ICD-10-CM | POA: Diagnosis not present

## 2019-07-10 DIAGNOSIS — E785 Hyperlipidemia, unspecified: Secondary | ICD-10-CM | POA: Diagnosis not present

## 2019-07-10 DIAGNOSIS — F0280 Dementia in other diseases classified elsewhere without behavioral disturbance: Secondary | ICD-10-CM | POA: Diagnosis not present

## 2019-07-10 DIAGNOSIS — G9340 Encephalopathy, unspecified: Secondary | ICD-10-CM | POA: Diagnosis not present

## 2019-07-10 DIAGNOSIS — Z87440 Personal history of urinary (tract) infections: Secondary | ICD-10-CM | POA: Diagnosis not present

## 2019-07-10 DIAGNOSIS — E46 Unspecified protein-calorie malnutrition: Secondary | ICD-10-CM | POA: Diagnosis not present

## 2019-07-10 NOTE — Telephone Encounter (Signed)
Mitzi wants to increase Remeron to 15mg  at bedtime like was discussed.

## 2019-07-10 NOTE — Telephone Encounter (Signed)
Patient was started on Remeron 7.5 mg beginning of week. Mitzi from [place] would like to increase her to 15 mg q hs.

## 2019-07-10 NOTE — Telephone Encounter (Signed)
Cassidy Martinez is aware.

## 2019-07-10 NOTE — Telephone Encounter (Signed)
Ok to increase to 1.5mg q hs

## 2019-07-12 DIAGNOSIS — Z87440 Personal history of urinary (tract) infections: Secondary | ICD-10-CM | POA: Diagnosis not present

## 2019-07-12 DIAGNOSIS — F0280 Dementia in other diseases classified elsewhere without behavioral disturbance: Secondary | ICD-10-CM | POA: Diagnosis not present

## 2019-07-12 DIAGNOSIS — E785 Hyperlipidemia, unspecified: Secondary | ICD-10-CM | POA: Diagnosis not present

## 2019-07-12 DIAGNOSIS — G9340 Encephalopathy, unspecified: Secondary | ICD-10-CM | POA: Diagnosis not present

## 2019-07-12 DIAGNOSIS — E46 Unspecified protein-calorie malnutrition: Secondary | ICD-10-CM | POA: Diagnosis not present

## 2019-07-12 DIAGNOSIS — G309 Alzheimer's disease, unspecified: Secondary | ICD-10-CM | POA: Diagnosis not present

## 2019-07-15 DIAGNOSIS — E46 Unspecified protein-calorie malnutrition: Secondary | ICD-10-CM | POA: Diagnosis not present

## 2019-07-15 DIAGNOSIS — G9340 Encephalopathy, unspecified: Secondary | ICD-10-CM | POA: Diagnosis not present

## 2019-07-15 DIAGNOSIS — Z87440 Personal history of urinary (tract) infections: Secondary | ICD-10-CM | POA: Diagnosis not present

## 2019-07-15 DIAGNOSIS — E785 Hyperlipidemia, unspecified: Secondary | ICD-10-CM | POA: Diagnosis not present

## 2019-07-15 DIAGNOSIS — H409 Unspecified glaucoma: Secondary | ICD-10-CM | POA: Diagnosis not present

## 2019-07-15 DIAGNOSIS — F0280 Dementia in other diseases classified elsewhere without behavioral disturbance: Secondary | ICD-10-CM | POA: Diagnosis not present

## 2019-07-15 DIAGNOSIS — H3530 Unspecified macular degeneration: Secondary | ICD-10-CM | POA: Diagnosis not present

## 2019-07-15 DIAGNOSIS — Z853 Personal history of malignant neoplasm of breast: Secondary | ICD-10-CM | POA: Diagnosis not present

## 2019-07-15 DIAGNOSIS — G309 Alzheimer's disease, unspecified: Secondary | ICD-10-CM | POA: Diagnosis not present

## 2019-07-16 NOTE — Telephone Encounter (Signed)
Called Mitzi.  Ok'd lorazepam .25mg  - .5mg  q 2 hours prn.  Remeron d/ced already.  Will call if problems.

## 2019-07-16 NOTE — Telephone Encounter (Signed)
Mitzi called and stated that Remeron is making the pt more agitated. The LORazepam (ATIVAN) 2 MG/ML concentrated solution is working. Mitzi needs verbal  orders to increase the LORazepam  Please contact [place] at [phone]

## 2019-07-16 NOTE — Telephone Encounter (Signed)
Mitzi called the office Mitzi called and stated that Remeron is making the pt more agitated. The LORazepam (ATIVAN) 2 MG/ML concentrated solution is working. Mitzi needs verbal  orders to increase the LORazepam  Please contact [place] at [phone]

## 2019-07-17 DIAGNOSIS — F0280 Dementia in other diseases classified elsewhere without behavioral disturbance: Secondary | ICD-10-CM | POA: Diagnosis not present

## 2019-07-17 DIAGNOSIS — E785 Hyperlipidemia, unspecified: Secondary | ICD-10-CM | POA: Diagnosis not present

## 2019-07-17 DIAGNOSIS — Z87440 Personal history of urinary (tract) infections: Secondary | ICD-10-CM | POA: Diagnosis not present

## 2019-07-17 DIAGNOSIS — E46 Unspecified protein-calorie malnutrition: Secondary | ICD-10-CM | POA: Diagnosis not present

## 2019-07-17 DIAGNOSIS — G9340 Encephalopathy, unspecified: Secondary | ICD-10-CM | POA: Diagnosis not present

## 2019-07-17 DIAGNOSIS — G309 Alzheimer's disease, unspecified: Secondary | ICD-10-CM | POA: Diagnosis not present

## 2019-07-18 DIAGNOSIS — F0280 Dementia in other diseases classified elsewhere without behavioral disturbance: Secondary | ICD-10-CM | POA: Diagnosis not present

## 2019-07-18 DIAGNOSIS — G9340 Encephalopathy, unspecified: Secondary | ICD-10-CM | POA: Diagnosis not present

## 2019-07-18 DIAGNOSIS — E785 Hyperlipidemia, unspecified: Secondary | ICD-10-CM | POA: Diagnosis not present

## 2019-07-18 DIAGNOSIS — E46 Unspecified protein-calorie malnutrition: Secondary | ICD-10-CM | POA: Diagnosis not present

## 2019-07-18 DIAGNOSIS — G309 Alzheimer's disease, unspecified: Secondary | ICD-10-CM | POA: Diagnosis not present

## 2019-07-18 DIAGNOSIS — Z87440 Personal history of urinary (tract) infections: Secondary | ICD-10-CM | POA: Diagnosis not present

## 2019-07-19 DIAGNOSIS — E785 Hyperlipidemia, unspecified: Secondary | ICD-10-CM | POA: Diagnosis not present

## 2019-07-19 DIAGNOSIS — G9340 Encephalopathy, unspecified: Secondary | ICD-10-CM | POA: Diagnosis not present

## 2019-07-19 DIAGNOSIS — Z87440 Personal history of urinary (tract) infections: Secondary | ICD-10-CM | POA: Diagnosis not present

## 2019-07-19 DIAGNOSIS — F0280 Dementia in other diseases classified elsewhere without behavioral disturbance: Secondary | ICD-10-CM | POA: Diagnosis not present

## 2019-07-19 DIAGNOSIS — E46 Unspecified protein-calorie malnutrition: Secondary | ICD-10-CM | POA: Diagnosis not present

## 2019-07-19 DIAGNOSIS — G309 Alzheimer's disease, unspecified: Secondary | ICD-10-CM | POA: Diagnosis not present

## 2019-07-22 DIAGNOSIS — G9340 Encephalopathy, unspecified: Secondary | ICD-10-CM | POA: Diagnosis not present

## 2019-07-22 DIAGNOSIS — E46 Unspecified protein-calorie malnutrition: Secondary | ICD-10-CM | POA: Diagnosis not present

## 2019-07-22 DIAGNOSIS — Z87440 Personal history of urinary (tract) infections: Secondary | ICD-10-CM | POA: Diagnosis not present

## 2019-07-22 DIAGNOSIS — F0280 Dementia in other diseases classified elsewhere without behavioral disturbance: Secondary | ICD-10-CM | POA: Diagnosis not present

## 2019-07-22 DIAGNOSIS — E785 Hyperlipidemia, unspecified: Secondary | ICD-10-CM | POA: Diagnosis not present

## 2019-07-22 DIAGNOSIS — G309 Alzheimer's disease, unspecified: Secondary | ICD-10-CM | POA: Diagnosis not present

## 2019-07-23 DIAGNOSIS — G9340 Encephalopathy, unspecified: Secondary | ICD-10-CM | POA: Diagnosis not present

## 2019-07-23 DIAGNOSIS — E46 Unspecified protein-calorie malnutrition: Secondary | ICD-10-CM | POA: Diagnosis not present

## 2019-07-23 DIAGNOSIS — Z87440 Personal history of urinary (tract) infections: Secondary | ICD-10-CM | POA: Diagnosis not present

## 2019-07-23 DIAGNOSIS — E785 Hyperlipidemia, unspecified: Secondary | ICD-10-CM | POA: Diagnosis not present

## 2019-07-23 DIAGNOSIS — G309 Alzheimer's disease, unspecified: Secondary | ICD-10-CM | POA: Diagnosis not present

## 2019-07-23 DIAGNOSIS — F0280 Dementia in other diseases classified elsewhere without behavioral disturbance: Secondary | ICD-10-CM | POA: Diagnosis not present

## 2019-07-24 DIAGNOSIS — G309 Alzheimer's disease, unspecified: Secondary | ICD-10-CM | POA: Diagnosis not present

## 2019-07-24 DIAGNOSIS — F0280 Dementia in other diseases classified elsewhere without behavioral disturbance: Secondary | ICD-10-CM | POA: Diagnosis not present

## 2019-07-24 DIAGNOSIS — E46 Unspecified protein-calorie malnutrition: Secondary | ICD-10-CM | POA: Diagnosis not present

## 2019-07-24 DIAGNOSIS — E785 Hyperlipidemia, unspecified: Secondary | ICD-10-CM | POA: Diagnosis not present

## 2019-07-24 DIAGNOSIS — Z87440 Personal history of urinary (tract) infections: Secondary | ICD-10-CM | POA: Diagnosis not present

## 2019-07-24 DIAGNOSIS — G9340 Encephalopathy, unspecified: Secondary | ICD-10-CM | POA: Diagnosis not present

## 2019-07-26 DIAGNOSIS — G9340 Encephalopathy, unspecified: Secondary | ICD-10-CM | POA: Diagnosis not present

## 2019-07-26 DIAGNOSIS — G309 Alzheimer's disease, unspecified: Secondary | ICD-10-CM | POA: Diagnosis not present

## 2019-07-26 DIAGNOSIS — F0280 Dementia in other diseases classified elsewhere without behavioral disturbance: Secondary | ICD-10-CM | POA: Diagnosis not present

## 2019-07-26 DIAGNOSIS — E46 Unspecified protein-calorie malnutrition: Secondary | ICD-10-CM | POA: Diagnosis not present

## 2019-07-26 DIAGNOSIS — Z87440 Personal history of urinary (tract) infections: Secondary | ICD-10-CM | POA: Diagnosis not present

## 2019-07-26 DIAGNOSIS — E785 Hyperlipidemia, unspecified: Secondary | ICD-10-CM | POA: Diagnosis not present

## 2019-07-28 DIAGNOSIS — Z87440 Personal history of urinary (tract) infections: Secondary | ICD-10-CM | POA: Diagnosis not present

## 2019-07-28 DIAGNOSIS — E46 Unspecified protein-calorie malnutrition: Secondary | ICD-10-CM | POA: Diagnosis not present

## 2019-07-28 DIAGNOSIS — E785 Hyperlipidemia, unspecified: Secondary | ICD-10-CM | POA: Diagnosis not present

## 2019-07-28 DIAGNOSIS — G309 Alzheimer's disease, unspecified: Secondary | ICD-10-CM | POA: Diagnosis not present

## 2019-07-28 DIAGNOSIS — F0280 Dementia in other diseases classified elsewhere without behavioral disturbance: Secondary | ICD-10-CM | POA: Diagnosis not present

## 2019-07-28 DIAGNOSIS — G9340 Encephalopathy, unspecified: Secondary | ICD-10-CM | POA: Diagnosis not present

## 2019-07-29 DIAGNOSIS — E46 Unspecified protein-calorie malnutrition: Secondary | ICD-10-CM | POA: Diagnosis not present

## 2019-07-29 DIAGNOSIS — G9340 Encephalopathy, unspecified: Secondary | ICD-10-CM | POA: Diagnosis not present

## 2019-07-29 DIAGNOSIS — F0280 Dementia in other diseases classified elsewhere without behavioral disturbance: Secondary | ICD-10-CM | POA: Diagnosis not present

## 2019-07-29 DIAGNOSIS — G309 Alzheimer's disease, unspecified: Secondary | ICD-10-CM | POA: Diagnosis not present

## 2019-07-29 DIAGNOSIS — Z87440 Personal history of urinary (tract) infections: Secondary | ICD-10-CM | POA: Diagnosis not present

## 2019-07-29 DIAGNOSIS — E785 Hyperlipidemia, unspecified: Secondary | ICD-10-CM | POA: Diagnosis not present

## 2019-07-31 DIAGNOSIS — G9340 Encephalopathy, unspecified: Secondary | ICD-10-CM | POA: Diagnosis not present

## 2019-07-31 DIAGNOSIS — Z87440 Personal history of urinary (tract) infections: Secondary | ICD-10-CM | POA: Diagnosis not present

## 2019-07-31 DIAGNOSIS — E785 Hyperlipidemia, unspecified: Secondary | ICD-10-CM | POA: Diagnosis not present

## 2019-07-31 DIAGNOSIS — G309 Alzheimer's disease, unspecified: Secondary | ICD-10-CM | POA: Diagnosis not present

## 2019-07-31 DIAGNOSIS — F0280 Dementia in other diseases classified elsewhere without behavioral disturbance: Secondary | ICD-10-CM | POA: Diagnosis not present

## 2019-07-31 DIAGNOSIS — E46 Unspecified protein-calorie malnutrition: Secondary | ICD-10-CM | POA: Diagnosis not present

## 2019-08-01 DIAGNOSIS — G9340 Encephalopathy, unspecified: Secondary | ICD-10-CM | POA: Diagnosis not present

## 2019-08-01 DIAGNOSIS — E46 Unspecified protein-calorie malnutrition: Secondary | ICD-10-CM | POA: Diagnosis not present

## 2019-08-01 DIAGNOSIS — F0280 Dementia in other diseases classified elsewhere without behavioral disturbance: Secondary | ICD-10-CM | POA: Diagnosis not present

## 2019-08-01 DIAGNOSIS — Z87440 Personal history of urinary (tract) infections: Secondary | ICD-10-CM | POA: Diagnosis not present

## 2019-08-01 DIAGNOSIS — E785 Hyperlipidemia, unspecified: Secondary | ICD-10-CM | POA: Diagnosis not present

## 2019-08-01 DIAGNOSIS — G309 Alzheimer's disease, unspecified: Secondary | ICD-10-CM | POA: Diagnosis not present

## 2019-08-05 ENCOUNTER — Telehealth: Payer: Self-pay

## 2019-08-05 NOTE — Telephone Encounter (Signed)
Death certificate received and completed. Funeral home has been notified and stated that they will pick up tomorrow. Death certificate has been placed up front in accordion folder for pick up. Changed to deceased in chart

## 2019-08-12 DEATH — deceased

## 2020-02-12 DEATH — deceased

## 2021-01-31 IMAGING — CT CT HEAD W/O CM
3 series · 16 of 47 positions shown, 19 images · non-contrast
Comparison: MRI October 21, 2016

CLINICAL DATA: Dementia change in mental status

EXAM:
CT HEAD WITHOUT CONTRAST
TECHNIQUE: Contiguous axial images were obtained from the base of the skull
through the vertex without intravenous contrast.

[Series 3: head wo · axial · 0.43mm/px · z∈[+306,+436]mm · 10 of 32 slices shown, 13 images]
[im 3/32  brain]
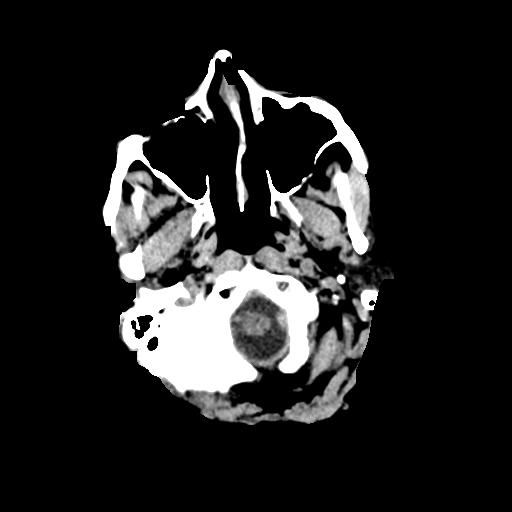
[im 3/32  bone]
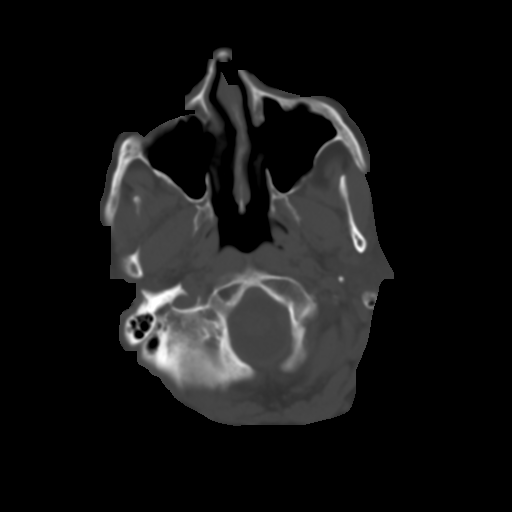
[im 6/32  brain]
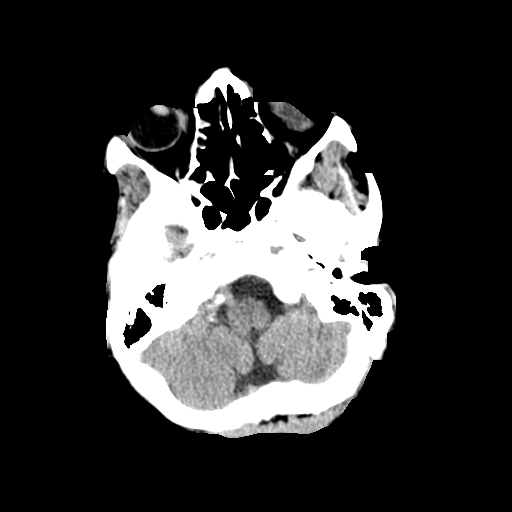
[im 9/32  brain]
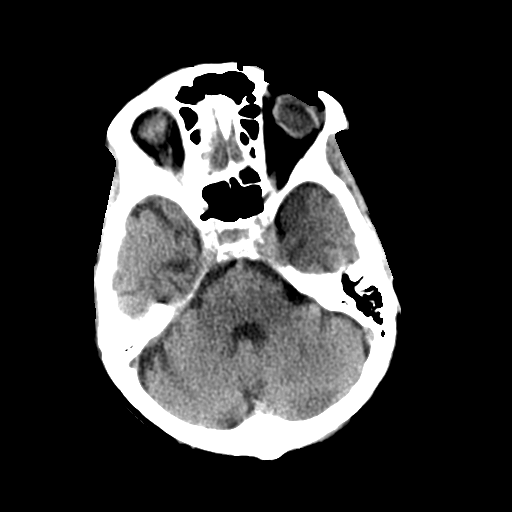
[im 11/32  brain]
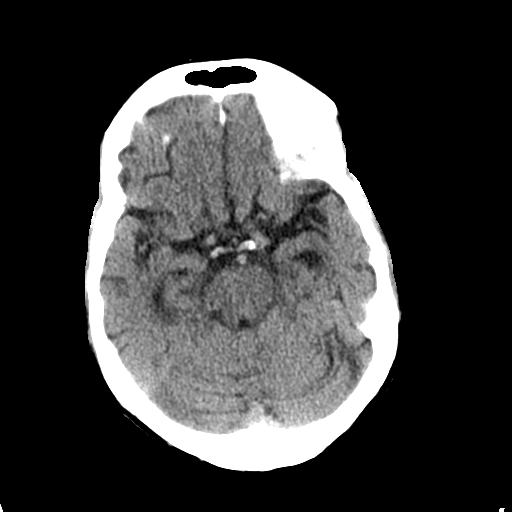
[im 14/32  brain]
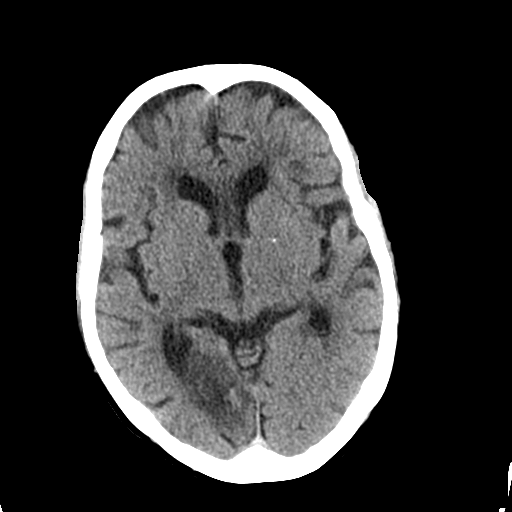
[im 14/32  bone]
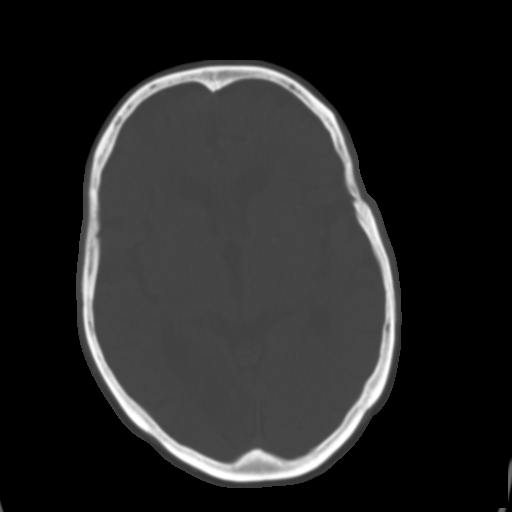
[im 18/32  brain]
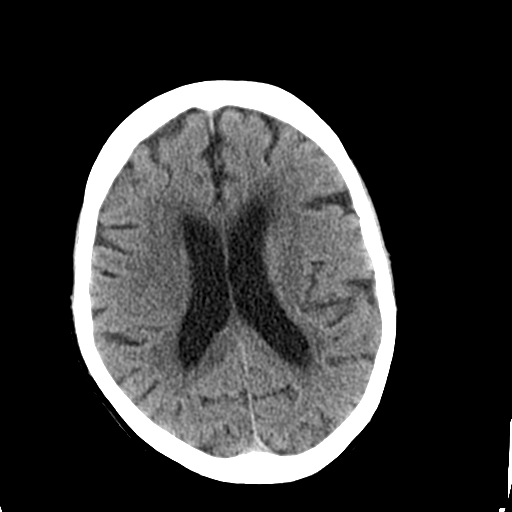
[im 21/32  brain]
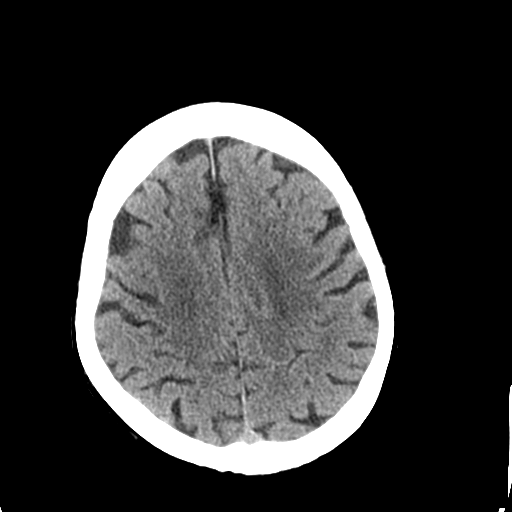
[im 24/32  brain]
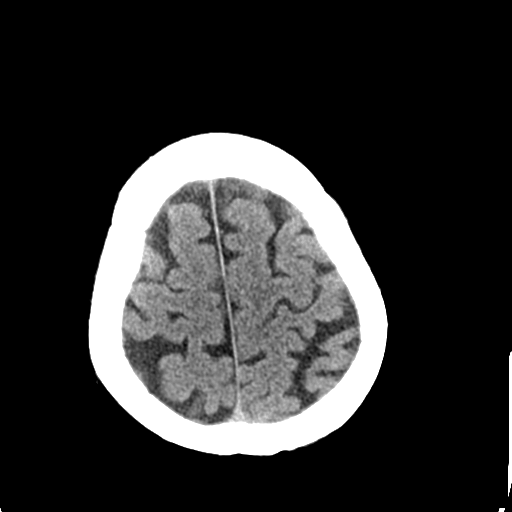
[im 26/32  brain]
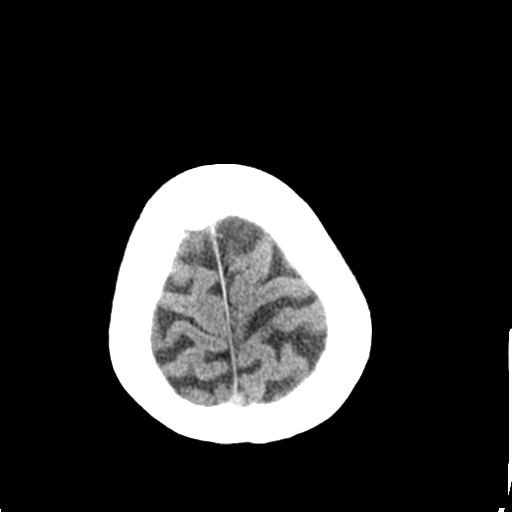
[im 26/32  bone]
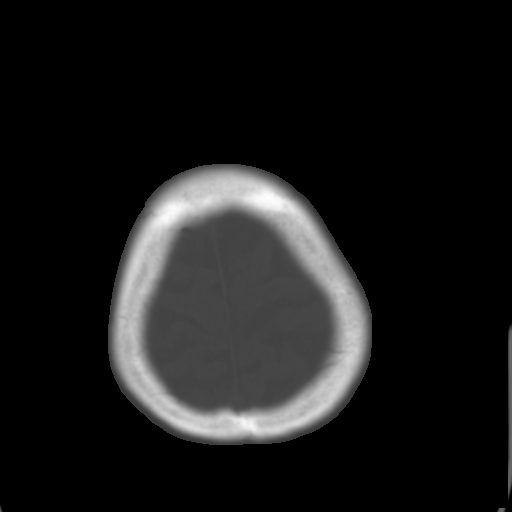
[im 29/32  brain]
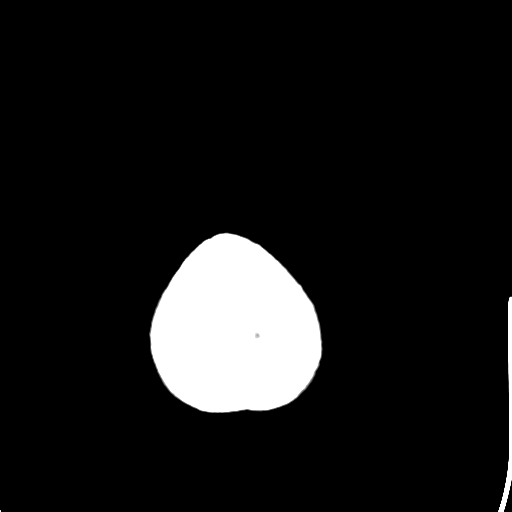

[Series 4: coronal soft tissue · coronal · 0.30mm/px · 3 of 62 slices shown]
[im 21/62  brain]
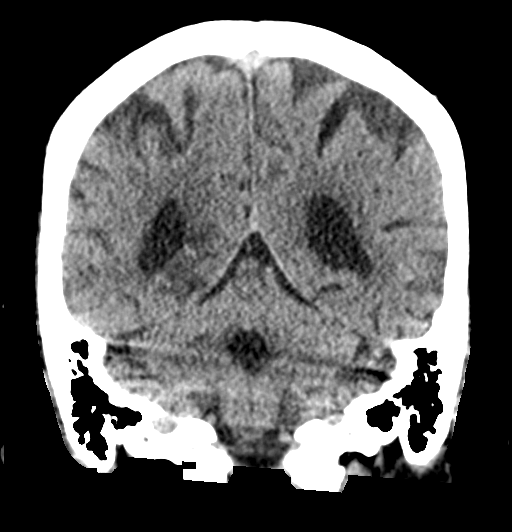
[im 28/62  brain]
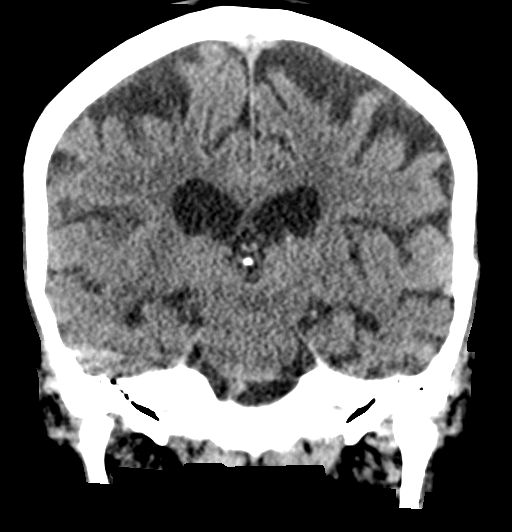
[im 34/62  brain]
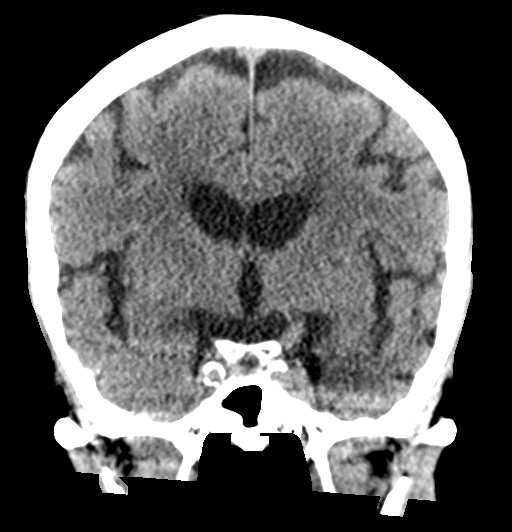

[Series 5: sagittal soft tissue · sagittal · 0.32mm/px · 3 of 46 slices shown]
[im 16/46  brain]
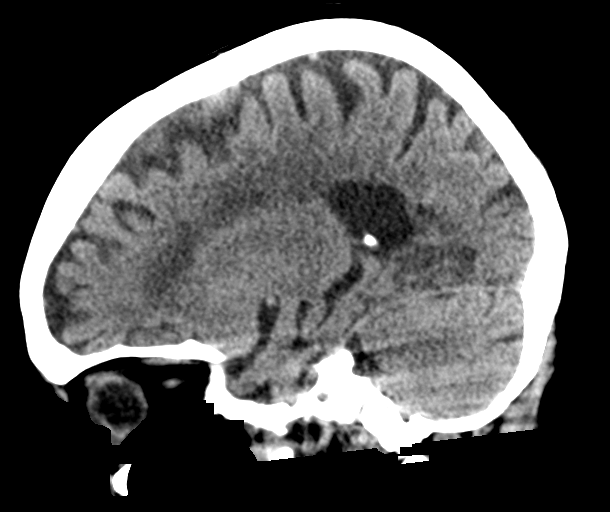
[im 23/46  brain]
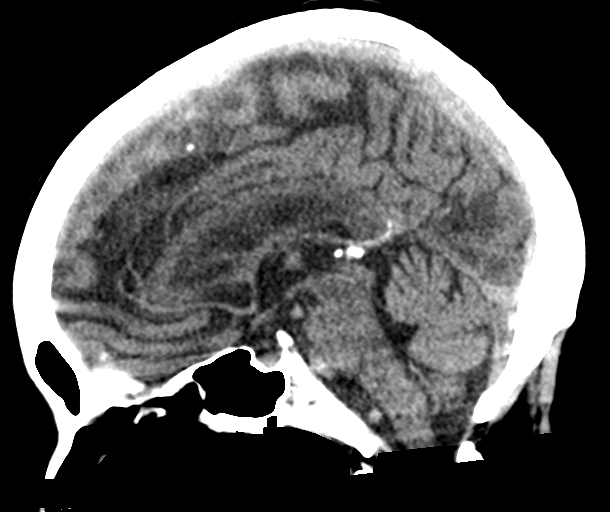
[im 31/46  brain]
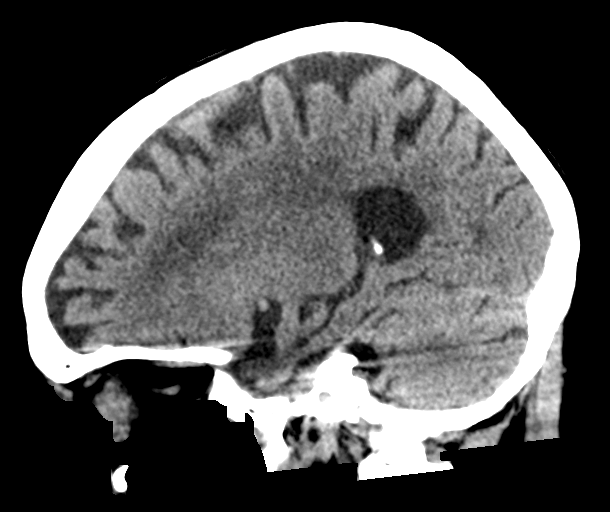

[16 of 47 positions shown; findings below may reference images not displayed]

FINDINGS: Brain: Area hypoattenuation is seen involving the right paramedian
occipital lobe which was not seen on the prior exam. No extra-axial
collections. No midline shift.

There is dilatation the ventricles and sulci consistent with
age-related atrophy. Low-attenuation changes in the deep white
matter consistent with small vessel ischemia.

Vascular: No hyperdense vessel or unexpected calcification.

Skull: The skull is intact. No fracture or focal lesion identified.

Sinuses/Orbits: The visualized paranasal sinuses and mastoid air
cells are clear. The orbits and globes intact.

Other: None
IMPRESSION: Age indeterminate, likely subacute or chronic infarct involving the
right paramedian occipital lobe.

Findings consistent with age related atrophy and chronic small
vessel ischemia
